# Patient Record
Sex: Female | Born: 1945
Health system: Southern US, Community
[De-identification: ages and names within clinical notes are randomized; demographics above are authoritative.]

## PROBLEM LIST (undated history)

## (undated) DIAGNOSIS — C8590 Non-Hodgkin lymphoma, unspecified, unspecified site: Secondary | ICD-10-CM

## (undated) DIAGNOSIS — E785 Hyperlipidemia, unspecified: Secondary | ICD-10-CM

## (undated) DIAGNOSIS — F39 Unspecified mood [affective] disorder: Secondary | ICD-10-CM

## (undated) DIAGNOSIS — Z780 Asymptomatic menopausal state: Secondary | ICD-10-CM

## (undated) DIAGNOSIS — M159 Polyosteoarthritis, unspecified: Secondary | ICD-10-CM

## (undated) DIAGNOSIS — D259 Leiomyoma of uterus, unspecified: Secondary | ICD-10-CM

## (undated) DIAGNOSIS — G479 Sleep disorder, unspecified: Secondary | ICD-10-CM

## (undated) DIAGNOSIS — I10 Essential (primary) hypertension: Secondary | ICD-10-CM

## (undated) HISTORY — DX: Asymptomatic menopausal state: Z78.0

## (undated) HISTORY — DX: Hyperlipidemia, unspecified: E78.5

## (undated) HISTORY — DX: Unspecified mood (affective) disorder: F39

## (undated) HISTORY — DX: Polyosteoarthritis, unspecified: M15.9

## (undated) HISTORY — PX: CATARACT EXTRACTION W/ INTRAOCULAR LENS IMPLANT: SHX1309

## (undated) HISTORY — DX: Leiomyoma of uterus, unspecified: D25.9

## (undated) HISTORY — DX: Essential (primary) hypertension: I10

## (undated) HISTORY — DX: Sleep disorder, unspecified: G47.9

## (undated) HISTORY — DX: Non-Hodgkin lymphoma, unspecified, unspecified site: C85.90

## (undated) HISTORY — PX: OTHER SURGICAL HISTORY: SHX169

## (undated) NOTE — *Deleted (*Deleted)
HEMATOLOGY/ONCOLOGY CONSULTATION NOTE  Date of Service: 01/31/2020  Patient Care Team: Marisue Ivan, MD as PCP - General (Family Medicine)  CHIEF COMPLAINTS/PURPOSE OF CONSULTATION:  Non-Hodgkin's Lymphoma  ONCOLOGIC Hx:  B-cell lymphoproliferative disorder, NOS, diagnosed originally by fine needle aspiration of right groin lymph node on 10/25/12  a. S/p excisional biopsy of right inguinal lymph node on 12/08/12 with finding of B cell lymphoproliferative disorder with indistinct phenotype.  b. PET and CT scans performed on 12/21/12 consistent with clinical stage disease with adenopathy in both inguinal and femoral areas and left posterior triangle in the neck and probably the left axilla.  c. Completion of five cycles of CVP-R from 05/05/18 through 08/18/18.   12/08/2012 Surgical pathology of right groin lymph node found: Abnormal Lymphoma FISH result, loss of IGH/14q DNA sequence   HISTORY OF PRESENTING ILLNESS:   Sherry Mosley is a wonderful 52 y.o. female who has been referred to [place] by Dr Versie Starks for evaluation and management of Non-Hodgkin's Lymphoma. The pt reports that she is doing well overall.  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 She has recently moved to [place] on 11/07/2018. She has not had the chance to begin care with a PCP.   The pt reports that she had Pancreatitis in 09/2012 which send her to the ER. Pt also notes that she had a rash that began in 08/2012. Her son, who is also a physician, thought that she had passing viral Pancreatitis due to her symptoms. She then had inguinal lymphadenopathy that was incidentally noted but was not causing problems. She was diagnosed with Non-Hodgkin's Lymphoma in 09/2012 after an excisional biopsy of right inguinal lymph node and a PET/CT Scan. They were following her until 06/2018 when they did a repeat CT scan and found enlarging lymph nodes. Pt denies having any symptoms at that time. They then  began R-CVP in March due to the pt's insistance but she began feeling fatigued and was in bed for >50% of the time. She chose to stop treatment after fifth cycle due to her moving and knowing that she would not be able to do so with the severe fatigue. Her last treatment was in June. She does not currently feel any enlarged lymph nodes but she notes that she doesn't examine herself often. Pt also had some peripheral neuropathy with R-CVP which has since resolved. She denies any transfusion needs or infection issues during treatments. Pt did have some insomnia with Prednisone at 100 mg so she was lowered to 60 mg which was much better. She did not have any cytopenias related to her treatment.  Pt is on Lisinopril 10mg  for her Essential HTN and Simvastatin 20mg  for HLD. Pt has continued to stay up to date with her age-appropriate cancer screenings. Her father was diagnosed with stage 4 Prostate Cancer in his 49's and he lived for 11 years after diagnosis. There is no other family history of cancer or blood/immunological disorders. Pt has not been on any other immunosuppressive drugs and has never had any rheumatological issues. Pt has never smoked and does not drink outside of social situations.   Most recent lab results (09/12/2018) of CBC is as follows: all values are WNL except for WBC at 4.7K, ANC at 2612, Hgb at 13.4, PLTs at 202K.  On review of systems, pt denies oral thrush, abdominal pain, change in bowel habits, fever, chills, drenching night sweats, skin rashes, fatigue, numbness/tingling in her fingers and any other symptoms.  On PMHx the pt reports HTN, HLD, Pancreatitis (09/2012). On Social Hx the pt reports she is a non-smoker, doesn't drink outside of social situations. On Family Hx the pt reports a father Dx with Prostate Cancer in his 13's.  INTERVAL HISTORY:  Sherry Mosley is a wonderful 53 y.o. female who is here for evaluation and management of Non-Hodgkin's Lymphoma. The patient's  last visit with [place] was on 08/31/2019. The pt reports that she is doing well overall.  The pt reports ***  Of note since the patient's last visit, pt has had *** completed on *** with results revealing ***.  Lab results today (01/31/20) of CBC w/diff and CMP is as follows: all values are WNL except for ***. 01/31/2020 LDH at ***  On review of systems, pt reports *** and denies *** and any other symptoms.   A&P: -Discussed pt labwork today, 01/31/20; *** -***  MEDICAL HISTORY:   Pancreatitis (10/20/12), unknown etiology  Postmenopausal Uterine fibroids Benign breast biopsy (1964) Right ankle arthrodesis (2012) Bilateral cataract extractions (2001, 2005) Hypercholesterolemia Essential hypertension  SURGICAL HISTORY: Inguinal LN biopsy TAH/BSO (1997)  SOCIAL HISTORY: Social History   Socioeconomic History  . Marital status: Married    Spouse name: Not on file  . Number of children: 2  . Years of education: Not on file  . Highest education level: Not on file  Occupational History  . 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 Smoking status: Never Smoker  . Smokeless tobacco: Never Used  Substance and Sexual Activity  . Alcohol use: Not on file  . Drug use: Not on file  . Sexual activity: Not on file  Other Topics Concern  . Not on file  Social History Narrative   1 daughter---pastor   Son --ER physician   3 stepsons   Current marriage since 2002      Has living will   Husband is health care POA. Son is alternate   Would accept resuscitation   No tube feeds if cognitively unaware   Social Determinants of Health   Financial Resource Strain:   . Difficulty of Paying Living Expenses: Not on file  Food Insecurity:   . Worried About Programme researcher, broadcasting/film/video in the Last Year: Not on file  . Ran Out of Food in the Last Year: Not on file  Transportation Needs:   . Lack of Transportation (Medical): Not on file  . Lack of  Transportation (Non-Medical): Not on file  Physical Activity:   . Days of Exercise per Week: Not on file  . Minutes of Exercise per Session: Not on file  Stress:   . Feeling of Stress : Not on file  Social Connections:   . Frequency of Communication with Friends and Family: Not on file  . Frequency of Social Gatherings with Friends and Family: Not on file  . Attends Religious Services: Not on file  . Active Member of Clubs or Organizations: Not on file  . Attends Banker Meetings: Not on file  . Marital Status: Not on file  Intimate Partner Violence:   . Fear of Current or Ex-Partner: Not on file  . Emotionally Abused: Not on file  . Physically Abused: Not on file  . Sexually Abused: Not on file    FAMILY HISTORY: Family History  Problem Relation Age of Onset  . Alzheimer's disease Mother   . Heart failure Father   . Prostate cancer Father   .  Diabetes Maternal Grandfather   . Breast cancer Neg Hx     ALLERGIES:  is allergic to cephalosporins and penicillins.  PCN - significant rash  Cephalosporins - significant rash   MEDICATIONS:  Current Outpatient Medications  Medication Sig Dispense Refill  . Ascorbic Acid (VITAMIN C) 100 MG tablet Take 1 tablet by mouth daily.    . [place]well[place]-Glucosamine-Vit D (OSTEO BI-FLEX ONE PER DAY PO) Take by mouth.    . Cholecalciferol (VITAMIN D3) 50 MCG (2000 UT) capsule Take 1 capsule by mouth daily.    . citalopram (CELEXA) 20 MG tablet Take 1 tablet by mouth daily.    Marland Kitchen lisinopril (ZESTRIL) 10 MG tablet Take 1 tablet (10 mg total) by mouth daily. 30 tablet 5  . Loratadine 10 MG CAPS Take 1 tablet by mouth daily.    . nabumetone (RELAFEN) 750 MG tablet Take 1 tablet (750 mg total) by mouth 2 (two) times daily as needed. 30 tablet 3  . simvastatin (ZOCOR) 20 MG tablet Take 1 tablet (20 mg total) by mouth daily. 30 tablet 5  . temazepam (RESTORIL) 15 MG capsule Take 1 capsule by mouth daily.    . vitamin E 400 UNIT capsule Take  1 capsule by mouth daily.     No current facility-administered medications for this visit.  Lisinopril 10mg  -  HTN  Simvastatin 20mg  - HLD  REVIEW OF SYSTEMS:   A 10+ POINT REVIEW OF SYSTEMS WAS OBTAINED including neurology, dermatology, psychiatry, cardiac, respiratory, lymph, extremities, GI, GU, Musculoskeletal, constitutional, breasts, reproductive, HEENT.  All pertinent positives are noted in the HPI.  All others are negative.   PHYSICAL EXAMINATION: ECOG PERFORMANCE STATUS: 0 - Asymptomatic  . There were no vitals filed for this visit. There were no vitals filed for this visit. .There is no height or weight on file to calculate BMI.   *** GENERAL:alert, in no acute distress and comfortable SKIN: no acute rashes, no significant lesions EYES: conjunctiva are pink and non-injected, sclera anicteric OROPHARYNX: MMM, no exudates, no oropharyngeal erythema or ulceration NECK: supple, no JVD LYMPH:  no palpable lymphadenopathy in the cervical, axillary or inguinal regions LUNGS: clear to auscultation b/l with normal respiratory effort HEART: regular rate & rhythm ABDOMEN:  normoactive bowel sounds , non tender, not distended. No palpable hepatosplenomegaly.  Extremity: no pedal edema PSYCH: alert & oriented x 3 with fluent speech NEURO: no focal motor/sensory deficits  LABORATORY DATA:  I have reviewed the data as listed  . CBC Latest Ref Rng & Units 08/06/2019 04/16/2019 12/12/2018  WBC 4.0 - 10.5 K/uL 4.6 4.3 4.3  Hemoglobin 12.0 - 15.0 g/dL 16.1 09.6 04.5  Hematocrit 36 - 46 % 43.1 43.0 43.8  Platelets 150 - 400 K/uL 166 181 191    CMP Latest Ref Rng & Units 08/06/2019 04/16/2019 12/12/2018  Glucose 70 - 99 mg/dL 86 409(W) 84  BUN 8 - 23 mg/dL 17 20 19   Creatinine 0.44 - 1.00 mg/dL 1.19 1.47 8.29  Sodium 135 - 145 mmol/L 139 141 142  Potassium 3.5 - 5.1 mmol/L 4.2 4.1 4.6  Chloride 98 - 111 mmol/L 103 105 105  CO2 22 - 32 mmol/L 27 28 30   Calcium 8.9 - 10.3 mg/dL 9.2 9.1  9.5  Total Protein 6.5 - 8.1 g/dL 7.5 7.4 7.6  Total Bilirubin 0.3 - 1.2 mg/dL 0.6 0.3 0.5  Alkaline Phos 38 - 126 U/L 71 110 60  AST 15 - 41 U/L 44(H) 27 18  ALT 0 -  44 U/L 54(H) 67(H) 21   SURGICAL PATHOLOGY  CASE: [id number]  PATIENT: Sherry Mosley  Surgical Pathology Report   Specimen Submitted:  A. Lymph node, right inguinal   Clinical History: History of low grade B cell lymphoma, now with right  inguinal lymphadenopathy, post US guided BX   DIAGNOSIS:  A. LYMPH NODE, RIGHT INGUINAL; ULTRASOUND-GUIDED BIOPSY:  - LOW-GRADE B-CELL LYMPHOMA WITH FEATURES MOST SUGGESTIVE OF MARGINAL  ZONE LYMPHOMA.   Comment:  Sections demonstrate small to medium sized lymphocytes with monocytoid  features. Scattered plasma cells are present. Residual normal lymphoid  architecture is not identified.  Material was submitted for flow cytometric analysis which revealed a  monoclonal kappa B cell population that was negative for CD5, CD23, and  CD10 (see scanned report in [facility]).  A panel of immunohistochemical stains was performed with the following  pattern of immunoreactivity:  CD20: Positive in neoplastic lymphocytes  CD3: Highlights background T cells in a distribution similar to CD5  CD5: Highlights background T cells in a distribution similar to CD3  CD23: Highlights focal residual dendritic meshworks  CD10: Negative in neoplastic lymphocytes  BCL-2: Positive in neoplastic lymphocytes  BCL 6: Negative in neoplastic lymphocytes  Cyclin D1: Negative in neoplastic lymphocytes  Sox 11: Negative in neoplastic lymphocytes  Kappa-ISH: Positive in neoplastic lymphocytes  Lambda-ISH: Negative in neoplastic lymphocytes  Ki-67: Approximately 30%  The histologic features in conjunction with the immunophenotypic  findings support the diagnosis of a low-grade mature B-cell lymphoma  most suggestive of marginal zone lymphoma.   Sox 11 IHC and kappa / lambda ISH slides were prepared by [facility] [facility], [place], [place].  The remaining IHC slides were prepared by [facility], [place], [place]. All controls stained appropriately.   This test was developed and its performance characteristics determined  by [facility]. It has not been cleared or approved by the [place] Food and Drug  Administration. The FDA does not require this test to go through  premarket FDA review. This test is used for clinical purposes. It should  not be regarded as investigational or for research. This laboratory is  certified under the Clinical Laboratory Improvement Amendments (CLIA) as  qualified to perform high complexity clinical laboratory testing.   GROSS DESCRIPTION:  A. Labeled: Right inguinal lymph node  Received: The specimen is received fresh on saline soaked gauze.  Tissue fragment(s): Multiple  Size: Range from 0.5 to 1.9 cm in length and 0.1 cm in diameter  Description: Received are multiple cores and fragments of tan-white soft  tissue. 1 touch prep with Diff-Quik stain is performed. Representative  sections are submitted for flow cytometry in RPMI. The remainder of the  specimen is submitted in cassettes 1-4 with 1 core per cassette.    Final Diagnosis performed by Elijah Birk, MD.  Electronically signed  08/31/2019 1:38:09PM  The electronic signature indicates that the named Attending Pathologist  has evaluated the specimen  Technical component performed at [facility], [address] [place],  [place] [address] Lab: [phone] Dir: Jolene Schimke, MD, MMM  Professional component performed at [facility], [facility], [address] [place], [place], [place] [address] Lab: [id number]  Dir: Georgiann Cocker. Oneita Kras, MD   RADIOGRAPHIC STUDIES: I have personally reviewed the radiological images as listed and agreed with the findings in the report. No results found.  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   PLAN: *** -Diagnosis of low grade MZL  -Pt prefers to wait and watch   FOLLOW UP: ***  The total time spent in the appt was *** minutes and more than 50% was on counseling and direct patient cares.  All of the patient's questions were answered with apparent satisfaction. The patient knows to call the clinic with any problems, questions or concerns.   Wyvonnia Lora MD MS AAHIVMS [facility] Hematology/Oncology Physician [facility]  (Office):       [phone] (Work cell):  [phone] (Fax):           [phone]  01/31/2020 9:12 AM  I, Carollee Herter, am acting as a scribe for Dr. Wyvonnia Lora.   {Add Production assistant, radio Statement}

---
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
## 1967-03-02 HISTORY — PX: BREAST BIOPSY: SHX20

## 1995-03-02 HISTORY — PX: ABDOMINAL HYSTERECTOMY: SHX81

## 2003-03-02 DIAGNOSIS — Z9841 Cataract extraction status, right eye: Secondary | ICD-10-CM

## 2003-03-02 HISTORY — DX: Cataract extraction status, right eye: Z98.41

## 2010-03-01 DIAGNOSIS — Z981 Arthrodesis status: Secondary | ICD-10-CM

## 2010-03-01 HISTORY — DX: Arthrodesis status: Z98.1

## 2010-03-01 HISTORY — PX: ANKLE ARTHRODESIS: SUR49

## 2012-03-01 HISTORY — PX: LYMPH NODE BIOPSY: SHX201

## 2012-10-20 DIAGNOSIS — K859 Acute pancreatitis without necrosis or infection, unspecified: Secondary | ICD-10-CM

## 2012-10-20 HISTORY — DX: Acute pancreatitis without necrosis or infection, unspecified: K85.90

## 2018-12-04 ENCOUNTER — Telehealth: Payer: Self-pay | Admitting: Hematology

## 2018-12-04 NOTE — Telephone Encounter (Signed)
Received a new patient referral from [place] for non-hodgkin lymphoma. Mrs. Sherry Mosley cld and has been scheduled to see Dr. Irene Limbo on 10/13 at 11am. She's been made aware to arrive 20 minutes early. I provided the address and location to our facility. Letter mailed.

## 2018-12-05 ENCOUNTER — Encounter: Payer: Self-pay | Admitting: Hematology

## 2018-12-11 NOTE — Progress Notes (Signed)
HEMATOLOGY/ONCOLOGY CONSULTATION NOTE  Date of Service: 12/12/2018  Patient Care Team: System, Pcp Not In as PCP - General  CHIEF COMPLAINTS/PURPOSE OF CONSULTATION:  Non-Hodgkin's Lymphoma  ONCOLOGIC Hx:  B-cell lymphoproliferative disorder, NOS, diagnosed originally by fine needle aspiration of right groin lymph node on 10/25/12  a. S/p excisional biopsy of right inguinal lymph node on 12/08/12 with finding of B cell lymphoproliferative disorder with indistinct phenotype.  b. PET and CT scans performed on 12/21/12 consistent with clinical stage disease with adenopathy in both inguinal and femoral areas and left posterior triangle in the neck and probably the left axilla.  c. Completion of five cycles of CVP-R from 05/05/18 through 08/18/18.   12/08/2012 Surgical pathology of right groin lymph node found: Abnormal Lymphoma FISH result, loss of IGH/14q DNA sequence   HISTORY OF PRESENTING ILLNESS:   Sherry Mosley is a wonderful 73 y.o. female who has been referred to [place] by Dr Seth Bake for evaluation and management of Non-Hodgkin's Lymphoma. The pt reports that she is doing well overall.  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. She has recently moved to [place] on 11/07/2018. She has not had the chance to begin care with a PCP.   The pt reports that she had Pancreatitis in 09/2012 which send her to the ER. Pt also notes that she had a rash that began in 08/2012. Her son, who is also a physician, thought that she had passing viral Pancreatitis due to her symptoms. She then had inguinal lymphadenopathy that was incidentally noted but was not causing problems. She was diagnosed with Non-Hodgkin's Lymphoma in 09/2012 after an excisional biopsy of right inguinal lymph node and a PET/CT Scan. They were following her until 06/2018 when they did a repeat CT scan and found enlarging lymph nodes. Pt denies having any symptoms at that time. They then began R-CVP in March  due to the pt's insistance but she began feeling fatigued and was in bed for >50% of the time. She chose to stop treatment after fifth cycle due to her moving and knowing that she would not be able to do so with the severe fatigue. Her last treatment was in June. She does not currently feel any enlarged lymph nodes but she notes that she doesn't examine herself often. Pt also had some peripheral neuropathy with R-CVP which has since resolved. She denies any transfusion needs or infection issues during treatments. Pt did have some insomnia with Prednisone at 100 mg so she was lowered to 60 mg which was much better. She did not have any cytopenias related to her treatment.  Pt is on Lisinopril 89m for her Essential HTN and Simvastatin 234mfor HLD. Pt has continued to stay up to date with her age-appropriate cancer screenings. Her father was diagnosed with stage 4 Prostate Cancer in his 702'snd he lived for 11 years after diagnosis. There is no other family history of cancer or blood/immunological disorders. Pt has not been on any other immunosuppressive drugs and has never had any rheumatological issues. Pt has never smoked and does not drink outside of social situations.   Most recent lab results (09/12/2018) of CBC is as follows: all values are WNL except for WBC at 4.7K, ANC at 2612, Hgb at 13.4, PLTs at 202K.  On review of systems, pt denies oral thrush, abdominal pain, change in bowel habits, fever, chills, drenching night sweats, skin rashes, fatigue, numbness/tingling in her fingers and any other symptoms.  On PMHx the pt reports HTN, HLD, Pancreatitis (09/2012). On Social Hx the pt reports she is a non-smoker, doesn't drink outside of social situations. On Family Hx the pt reports a father Dx with Prostate Cancer in his 37's.  MEDICAL HISTORY:   Pancreatitis (10/20/12), unknown etiology  Postmenopausal Uterine fibroids Benign breast biopsy (1964) Right ankle arthrodesis (2012) Bilateral  cataract extractions (2001, 2005) Hypercholesterolemia Essential hypertension  SURGICAL HISTORY: Inguinal LN biopsy TAH/BSO (1997)  SOCIAL HISTORY: Social History   Socioeconomic History  . Marital status: Married    Spouse name: Not on file  . Number of children: Not on file  . Years of education: Not on file  . Highest education level: Not on file  Occupational History  . Not on file  Social Needs  . Financial resource strain: Not on file  . Food insecurity    Worry: Not on file    Inability: Not on file  . Transportation needs    Medical: Not on file    Non-medical: Not on file  Tobacco Use  . Smoking status: Not on file  Substance and Sexual Activity  . Alcohol use: Not on file  . Drug use: Not on file  . Sexual activity: Not on file  Lifestyle  . Physical activity    Days per week: Not on file    Minutes per session: Not on file  . Stress: Not on file  Relationships  . Social Herbalist on phone: Not on file    Gets together: Not on file    Attends religious service: Not on file    Active member of club or organization: Not on file    Attends meetings of clubs or organizations: Not on file    Relationship status: Not on file  . Intimate partner violence    Fear of current or ex partner: Not on file    Emotionally abused: Not on file    Physically abused: Not on file    Forced sexual activity: Not on file  Other Topics Concern  . Not on file  Social History Narrative  . Not on file    FAMILY HISTORY: No family history on file.  ALLERGIES:  is allergic to cephalosporins and penicillins.  PCN - significant rash  Cephalosporins - significant rash   MEDICATIONS:  No current outpatient medications on file.   No current facility-administered medications for this visit.   Lisinopril 55m -  HTN  Simvastatin 268m- HLD  REVIEW OF SYSTEMS:    10 Point review of Systems was done is negative except as noted above.  PHYSICAL EXAMINATION:  ECOG PERFORMANCE STATUS: 0 - Asymptomatic  . Vitals:   12/12/18 1046  BP: (!) 143/89  Pulse: 87  Resp: 18  Temp: 98.5 F (36.9 C)  SpO2: 100%   Filed Weights   12/12/18 1046  Weight: 146 lb 8 oz (66.5 kg)   .Body mass index is 25.54 kg/m.  GENERAL:alert, in no acute distress and comfortable SKIN: no acute rashes, no significant lesions EYES: conjunctiva are pink and non-injected, sclera anicteric OROPHARYNX: MMM, no exudates, no oropharyngeal erythema or ulceration NECK: supple, no JVD LYMPH:  no palpable lymphadenopathy in the cervical, axillary or inguinal regions LUNGS: clear to auscultation b/l with normal respiratory effort HEART: regular rate & rhythm ABDOMEN:  normoactive bowel sounds , non tender, not distended. Extremity: no pedal edema PSYCH: alert & oriented x 3 with fluent speech NEURO: no focal motor/sensory deficits  LABORATORY DATA:  I have reviewed the data as listed  . CBC Latest Ref Rng & Units 12/12/2018  WBC 4.0 - 10.5 K/uL 4.3  Hemoglobin 12.0 - 15.0 g/dL 14.6  Hematocrit 36.0 - 46.0 % 43.8  Platelets 150 - 400 K/uL 191    CMP Latest Ref Rng & Units 12/12/2018  Glucose 70 - 99 mg/dL 84  BUN 8 - 23 mg/dL 19  Creatinine 0.44 - 1.00 mg/dL 0.91  Sodium 135 - 145 mmol/L 142  Potassium 3.5 - 5.1 mmol/L 4.6  Chloride 98 - 111 mmol/L 105  CO2 22 - 32 mmol/L 30  Calcium 8.9 - 10.3 mg/dL 9.5  Total Protein 6.5 - 8.1 g/dL 7.6  Total Bilirubin 0.3 - 1.2 mg/dL 0.5  Alkaline Phos 38 - 126 U/L 60  AST 15 - 41 U/L 18  ALT 0 - 44 U/L 21    RADIOGRAPHIC STUDIES: I have personally reviewed the radiological images as listed and agreed with the findings in the report. No results found.  ASSESSMENT & PLAN:  73 yo with   1) Atleast Stage IIIA Low grade B cell Non Hodgkins Lymphoma -indistinct phenotype ?Marginal zone lymphoma S/p R-CVP x  five cycles from 05/05/18 through 08/18/18.  PLAN: -Discussed patient's most recent labs from 09/12/2018, all  values are WNL except for WBC at 4.7K, ANC at 2612, Hgb at 13.4, PLTs at 202K. -Advised pt that we don't typically move to treat unless pt is experiencing constitutional symptoms, organ threatening bulky disease, or significantly altered blood counts -Will continue to monitor her labs and regular clinical examination -Recommend pt stay up to date with age-appropriate cancer screenings  -Will order PET/CT scan for post-treatment baseline in 1 week -Will get labs today for baseline -Will see back in 4 months with labs  -Advised pt that if she has any concerns or constitutional symptoms to contact us  FOLLOW UP: Labs today PET/CT in 1 week RTC with Dr Irene Limbo with labs in 4 months  All of the patients questions were answered with apparent satisfaction. The patient knows to call the clinic with any problems, questions or concerns.  I spent 85mns counseling the patient face to face. The total time spent in the appointment was 45 minutes and more than 50% was on counseling and direct patient cares.    GSullivan LoneMD MSoldierAAHIVMS SGrace Medical CenterCEndoscopy Center Of The Central CoastHematology/Oncology Physician [facility]West [facility] (Office):       [phone](Work cell):  [phone](Fax):           [phone] 12/12/2018 12:06 PM  I, JYevette Edwards am acting as a scribe for Dr. GSullivan Lone   .I have reviewed the above documentation for accuracy and completeness, and I agree with the above. .Brunetta GeneraMD     ADDENDUM  . CBC    Component Value Date/Time   WBC 4.3 12/12/2018 1203   RBC 4.77 12/12/2018 1203   HGB 14.6 12/12/2018 1203   HCT 43.8 12/12/2018 1203   PLT 191 12/12/2018 1203   MCV 91.8 12/12/2018 1203   MCH 30.6 12/12/2018 1203   MCHC 33.3 12/12/2018 1203   RDW 12.4 12/12/2018 1203   LYMPHSABS 1.3 12/12/2018 1203   MONOABS 0.5 12/12/2018 1203   EOSABS 0.1 12/12/2018 1203   BASOSABS 0.0 12/12/2018 1203    . CMP Latest Ref Rng & Units 12/12/2018  Glucose 70 - 99 mg/dL 84  BUN 8 -  23 mg/dL 19  Creatinine 0.44 - 1.00  mg/dL 0.91  Sodium 135 - 145 mmol/L 142  Potassium 3.5 - 5.1 mmol/L 4.6  Chloride 98 - 111 mmol/L 105  CO2 22 - 32 mmol/L 30  Calcium 8.9 - 10.3 mg/dL 9.5  Total Protein 6.5 - 8.1 g/dL 7.6  Total Bilirubin 0.3 - 1.2 mg/dL 0.5  Alkaline Phos 38 - 126 U/L 60  AST 15 - 41 U/L 18  ALT 0 - 44 U/L 21    . Lab Results  Component Value Date   LDH 175 12/12/2018

## 2018-12-12 ENCOUNTER — Inpatient Hospital Stay: Payer: Medicare Other | Attending: Hematology | Admitting: Hematology

## 2018-12-12 ENCOUNTER — Inpatient Hospital Stay: Payer: Medicare Other

## 2018-12-12 ENCOUNTER — Telehealth: Payer: Self-pay | Admitting: Hematology

## 2018-12-12 ENCOUNTER — Other Ambulatory Visit: Payer: Self-pay

## 2018-12-12 VITALS — BP 143/89 | HR 87 | Temp 98.5°F | Resp 18 | Ht 63.5 in | Wt 146.5 lb

## 2018-12-12 DIAGNOSIS — G4700 Insomnia, unspecified: Secondary | ICD-10-CM | POA: Diagnosis not present

## 2018-12-12 DIAGNOSIS — C8518 Unspecified B-cell lymphoma, lymph nodes of multiple sites: Secondary | ICD-10-CM

## 2018-12-12 DIAGNOSIS — Z9221 Personal history of antineoplastic chemotherapy: Secondary | ICD-10-CM | POA: Diagnosis not present

## 2018-12-12 DIAGNOSIS — E7800 Pure hypercholesterolemia, unspecified: Secondary | ICD-10-CM | POA: Insufficient documentation

## 2018-12-12 DIAGNOSIS — I10 Essential (primary) hypertension: Secondary | ICD-10-CM | POA: Diagnosis not present

## 2018-12-12 DIAGNOSIS — Z79899 Other long term (current) drug therapy: Secondary | ICD-10-CM | POA: Insufficient documentation

## 2018-12-12 DIAGNOSIS — Z90722 Acquired absence of ovaries, bilateral: Secondary | ICD-10-CM | POA: Insufficient documentation

## 2018-12-12 DIAGNOSIS — E785 Hyperlipidemia, unspecified: Secondary | ICD-10-CM | POA: Diagnosis not present

## 2018-12-12 LAB — CMP (CANCER CENTER ONLY)
ALT: 21 U/L (ref 0–44)
AST: 18 U/L (ref 15–41)
Albumin: 3.9 g/dL (ref 3.5–5.0)
Alkaline Phosphatase: 60 U/L (ref 38–126)
Anion gap: 7 (ref 5–15)
BUN: 19 mg/dL (ref 8–23)
CO2: 30 mmol/L (ref 22–32)
Calcium: 9.5 mg/dL (ref 8.9–10.3)
Chloride: 105 mmol/L (ref 98–111)
Creatinine: 0.91 mg/dL (ref 0.44–1.00)
GFR, Est AFR Am: >60 mL/min (ref >60)
GFR, Estimated: >60 mL/min (ref >60)
Glucose, Bld: 84 mg/dL (ref 70–99)
Potassium: 4.6 mmol/L (ref 3.5–5.1)
Sodium: 142 mmol/L (ref 135–145)
Total Bilirubin: 0.5 mg/dL (ref 0.3–1.2)
Total Protein: 7.6 g/dL (ref 6.5–8.1)

## 2018-12-12 LAB — CBC WITH DIFFERENTIAL/PLATELET
Abs Immature Granulocytes: 0.01 10*3/uL (ref 0.00–0.07)
Basophils Absolute: 0 10*3/uL (ref 0.0–0.1)
Basophils Relative: 1 %
Eosinophils Absolute: 0.1 10*3/uL (ref 0.0–0.5)
Eosinophils Relative: 3 %
HCT: 43.8 % (ref 36.0–46.0)
Hemoglobin: 14.6 g/dL (ref 12.0–15.0)
Immature Granulocytes: 0 %
Lymphocytes Relative: 31 %
Lymphs Abs: 1.3 10*3/uL (ref 0.7–4.0)
MCH: 30.6 pg (ref 26.0–34.0)
MCHC: 33.3 g/dL (ref 30.0–36.0)
MCV: 91.8 fL (ref 80.0–100.0)
Monocytes Absolute: 0.5 10*3/uL (ref 0.1–1.0)
Monocytes Relative: 11 %
Neutro Abs: 2.3 10*3/uL (ref 1.7–7.7)
Neutrophils Relative %: 54 %
Platelets: 191 10*3/uL (ref 150–400)
RBC: 4.77 MIL/uL (ref 3.87–5.11)
RDW: 12.4 % (ref 11.5–15.5)
WBC: 4.3 10*3/uL (ref 4.0–10.5)
nRBC: 0 % (ref 0.0–0.2)

## 2018-12-12 LAB — LACTATE DEHYDROGENASE: LDH: 175 U/L (ref 98–192)

## 2018-12-12 NOTE — Telephone Encounter (Signed)
Scheduled per 10/13 los, patient received after visit summary and calender.  °

## 2018-12-29 ENCOUNTER — Encounter (HOSPITAL_COMMUNITY)
Admission: RE | Admit: 2018-12-29 | Discharge: 2018-12-29 | Disposition: A | Discharge status: Home / Self Care | Payer: Medicare Other | Source: Ambulatory Visit | Attending: Hematology | Admitting: Hematology

## 2018-12-29 ENCOUNTER — Other Ambulatory Visit: Payer: Self-pay

## 2018-12-29 DIAGNOSIS — C8518 Unspecified B-cell lymphoma, lymph nodes of multiple sites: Secondary | ICD-10-CM | POA: Diagnosis present

## 2018-12-29 DIAGNOSIS — Z79899 Other long term (current) drug therapy: Secondary | ICD-10-CM | POA: Insufficient documentation

## 2018-12-29 LAB — GLUCOSE, CAPILLARY: Glucose-Capillary: 82 mg/dL (ref 70–99)

## 2018-12-29 MED ORDER — FLUDEOXYGLUCOSE F - 18 (FDG) INJECTION
7.3300 | Freq: Once | INTRAVENOUS | Status: AC | PRN
  Administered 2018-12-29: 7.33 via INTRAVENOUS

## 2019-01-16 ENCOUNTER — Telehealth: Payer: Self-pay

## 2019-01-16 NOTE — Telephone Encounter (Signed)
Yes Go ahead and set her up for an appt

## 2019-01-16 NOTE — Telephone Encounter (Signed)
Patient stated she is a new resident at [facility] and would like to know if you would be willing to see her as a new patient ?

## 2019-01-16 NOTE — Telephone Encounter (Signed)
I left a detailed message for patient to call back and schedule new patient appointment. For [facility], [place] will see patients with a problem in 1-2 weeks-patients no problems-1-2 months- 45 min appt. Marland Kitchen

## 2019-02-05 ENCOUNTER — Encounter: Payer: Self-pay | Admitting: Internal Medicine

## 2019-02-05 ENCOUNTER — Ambulatory Visit (INDEPENDENT_AMBULATORY_CARE_PROVIDER_SITE_OTHER): Payer: Medicare PPO | Admitting: Internal Medicine

## 2019-02-05 ENCOUNTER — Other Ambulatory Visit: Payer: Self-pay

## 2019-02-05 VITALS — BP 110/78 | HR 89 | Temp 97.6°F | Ht 62.75 in | Wt 145.0 lb

## 2019-02-05 DIAGNOSIS — M25511 Pain in right shoulder: Secondary | ICD-10-CM | POA: Diagnosis not present

## 2019-02-05 DIAGNOSIS — E785 Hyperlipidemia, unspecified: Secondary | ICD-10-CM | POA: Diagnosis not present

## 2019-02-05 DIAGNOSIS — I10 Essential (primary) hypertension: Secondary | ICD-10-CM | POA: Diagnosis not present

## 2019-02-05 DIAGNOSIS — M159 Polyosteoarthritis, unspecified: Secondary | ICD-10-CM | POA: Diagnosis not present

## 2019-02-05 DIAGNOSIS — C8590 Non-Hodgkin lymphoma, unspecified, unspecified site: Secondary | ICD-10-CM | POA: Insufficient documentation

## 2019-02-05 DIAGNOSIS — C8205 Follicular lymphoma grade I, lymph nodes of inguinal region and lower limb: Secondary | ICD-10-CM | POA: Diagnosis not present

## 2019-02-05 DIAGNOSIS — F39 Unspecified mood [affective] disorder: Secondary | ICD-10-CM | POA: Diagnosis not present

## 2019-02-05 DIAGNOSIS — G479 Sleep disorder, unspecified: Secondary | ICD-10-CM | POA: Insufficient documentation

## 2019-02-05 DIAGNOSIS — Z1211 Encounter for screening for malignant neoplasm of colon: Secondary | ICD-10-CM | POA: Diagnosis not present

## 2019-02-05 NOTE — Assessment & Plan Note (Signed)
Okay with primary prevention

## 2019-02-05 NOTE — Assessment & Plan Note (Signed)
BP Readings from Last 3 Encounters:  02/05/19 110/78  12/12/18 (!) 143/89   Good control Recent labs fine

## 2019-02-05 NOTE — Assessment & Plan Note (Signed)
Apparent soft tissue injury Discussed ROM and strengthening

## 2019-02-05 NOTE — Assessment & Plan Note (Signed)
Now under care here Will restart Rx eventually
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
## 2019-02-05 NOTE — Assessment & Plan Note (Addendum)
Discussed weaning off the temazepam Can try melatonin instead

## 2019-02-05 NOTE — Assessment & Plan Note (Signed)
Past reactive depression Discussed trying to wean off the citalopram

## 2019-02-05 NOTE — Assessment & Plan Note (Signed)
Discussed trying prn with the NSAID

## 2019-02-09 ENCOUNTER — Other Ambulatory Visit (INDEPENDENT_AMBULATORY_CARE_PROVIDER_SITE_OTHER): Payer: Medicare PPO

## 2019-02-09 DIAGNOSIS — Z1211 Encounter for screening for malignant neoplasm of colon: Secondary | ICD-10-CM | POA: Diagnosis not present

## 2019-02-09 LAB — FECAL OCCULT BLOOD, IMMUNOCHEMICAL: Fecal Occult Bld: NEGATIVE

## 2019-03-09 ENCOUNTER — Other Ambulatory Visit: Payer: Self-pay

## 2019-03-09 MED ORDER — LISINOPRIL 10 MG PO TABS: 10.0000 mg | ORAL_TABLET | Freq: Every day | ORAL | 5 refills | Status: DC

## 2019-03-09 MED ORDER — SIMVASTATIN 20 MG PO TABS: 20.0000 mg | ORAL_TABLET | Freq: Every day | ORAL | 5 refills | Status: DC

## 2019-03-09 MED ORDER — NABUMETONE 750 MG PO TABS: 750.0000 mg | ORAL_TABLET | Freq: Every day | ORAL | 0 refills | Status: DC

## 2019-03-09 NOTE — Telephone Encounter (Signed)
Patient left message on triage line stating Sherry Mosley has been trying to get refill on her medications and needs her b/p medication refilled. I left message for the patient to call us back so we can verify which medications she needs filled. Dr. Silvio Pate seen patient one time as a new patient. I do not see any requests for refills in the system from [facility] unless it came through as paper fax.

## 2019-03-09 NOTE — Telephone Encounter (Signed)
Patient called back and left message stating she needs to have RX for Simvastatin, Lisinopril and Nabumetone refilled to [facility] [address] since [place] will not be able to send out her medications on time before she runs out.  I need nabumetone refilled by the provider. And per message from patient she is taking this medication 1 tablet daily not twice daily.  I also left message for patient asking her if she still needs refills to go to [place] also?

## 2019-03-12 ENCOUNTER — Other Ambulatory Visit: Payer: Self-pay | Admitting: Lab

## 2019-03-12 MED ORDER — SIMVASTATIN 20 MG PO TABS: 20.0000 mg | ORAL_TABLET | Freq: Every day | ORAL | 5 refills | Status: DC

## 2019-03-12 MED ORDER — LISINOPRIL 10 MG PO TABS: 10.0000 mg | ORAL_TABLET | Freq: Every day | ORAL | 5 refills | Status: DC

## 2019-03-12 MED ORDER — NABUMETONE 750 MG PO TABS: 750.0000 mg | ORAL_TABLET | Freq: Every day | ORAL | 0 refills | Status: DC

## 2019-03-12 NOTE — Telephone Encounter (Signed)
Sent message to Dr Silvio Pate

## 2019-03-12 NOTE — Telephone Encounter (Signed)
Pt called office on 03/09/2019 Patient called back and left message stating she needs to have RX for Simvastatin, Lisinopril and Nabumetone refilled to [facility] [address] since [place] will not be able to send out her medications on time before she runs out.  I need nabumetone refilled by the provider. And per message from patient she is taking this medication 1 tablet daily not twice daily.  I also left message for patient asking her if she still needs refills to go to [place] also?

## 2019-04-16 ENCOUNTER — Telehealth: Payer: Self-pay | Admitting: Hematology

## 2019-04-16 ENCOUNTER — Inpatient Hospital Stay (HOSPITAL_BASED_OUTPATIENT_CLINIC_OR_DEPARTMENT_OTHER): Payer: Medicare PPO | Admitting: Hematology

## 2019-04-16 ENCOUNTER — Other Ambulatory Visit: Payer: Self-pay

## 2019-04-16 ENCOUNTER — Inpatient Hospital Stay: Payer: Medicare PPO | Attending: Hematology

## 2019-04-16 VITALS — BP 131/82 | HR 85 | Temp 97.8°F | Resp 18 | Ht 62.0 in | Wt 150.3 lb

## 2019-04-16 DIAGNOSIS — Z8042 Family history of malignant neoplasm of prostate: Secondary | ICD-10-CM | POA: Diagnosis not present

## 2019-04-16 DIAGNOSIS — C8518 Unspecified B-cell lymphoma, lymph nodes of multiple sites: Secondary | ICD-10-CM

## 2019-04-16 DIAGNOSIS — G4700 Insomnia, unspecified: Secondary | ICD-10-CM | POA: Diagnosis not present

## 2019-04-16 DIAGNOSIS — E785 Hyperlipidemia, unspecified: Secondary | ICD-10-CM | POA: Diagnosis not present

## 2019-04-16 DIAGNOSIS — Z791 Long term (current) use of non-steroidal anti-inflammatories (NSAID): Secondary | ICD-10-CM | POA: Insufficient documentation

## 2019-04-16 DIAGNOSIS — I10 Essential (primary) hypertension: Secondary | ICD-10-CM | POA: Diagnosis not present

## 2019-04-16 DIAGNOSIS — Z90710 Acquired absence of both cervix and uterus: Secondary | ICD-10-CM | POA: Insufficient documentation

## 2019-04-16 DIAGNOSIS — Z90722 Acquired absence of ovaries, bilateral: Secondary | ICD-10-CM | POA: Diagnosis not present

## 2019-04-16 DIAGNOSIS — E7800 Pure hypercholesterolemia, unspecified: Secondary | ICD-10-CM | POA: Diagnosis not present

## 2019-04-16 DIAGNOSIS — C8590 Non-Hodgkin lymphoma, unspecified, unspecified site: Secondary | ICD-10-CM | POA: Diagnosis not present

## 2019-04-16 DIAGNOSIS — Z8249 Family history of ischemic heart disease and other diseases of the circulatory system: Secondary | ICD-10-CM | POA: Insufficient documentation

## 2019-04-16 DIAGNOSIS — Z79899 Other long term (current) drug therapy: Secondary | ICD-10-CM | POA: Diagnosis not present

## 2019-04-16 DIAGNOSIS — Z9221 Personal history of antineoplastic chemotherapy: Secondary | ICD-10-CM | POA: Insufficient documentation

## 2019-04-16 DIAGNOSIS — Z833 Family history of diabetes mellitus: Secondary | ICD-10-CM | POA: Insufficient documentation

## 2019-04-16 DIAGNOSIS — Z9079 Acquired absence of other genital organ(s): Secondary | ICD-10-CM | POA: Insufficient documentation

## 2019-04-16 LAB — CBC WITH DIFFERENTIAL/PLATELET
Abs Immature Granulocytes: 0.01 10*3/uL (ref 0.00–0.07)
Basophils Absolute: 0 10*3/uL (ref 0.0–0.1)
Basophils Relative: 0 %
Eosinophils Absolute: 0.3 10*3/uL (ref 0.0–0.5)
Eosinophils Relative: 6 %
HCT: 43 % (ref 36.0–46.0)
Hemoglobin: 14.4 g/dL (ref 12.0–15.0)
Immature Granulocytes: 0 %
Lymphocytes Relative: 29 %
Lymphs Abs: 1.2 10*3/uL (ref 0.7–4.0)
MCH: 31 pg (ref 26.0–34.0)
MCHC: 33.5 g/dL (ref 30.0–36.0)
MCV: 92.5 fL (ref 80.0–100.0)
Monocytes Absolute: 0.4 10*3/uL (ref 0.1–1.0)
Monocytes Relative: 9 %
Neutro Abs: 2.4 10*3/uL (ref 1.7–7.7)
Neutrophils Relative %: 56 %
Platelets: 181 10*3/uL (ref 150–400)
RBC: 4.65 MIL/uL (ref 3.87–5.11)
RDW: 12.2 % (ref 11.5–15.5)
WBC: 4.3 10*3/uL (ref 4.0–10.5)
nRBC: 0 % (ref 0.0–0.2)

## 2019-04-16 LAB — CMP (CANCER CENTER ONLY)
ALT: 67 U/L — ABNORMAL HIGH (ref 0–44)
AST: 27 U/L (ref 15–41)
Albumin: 3.7 g/dL (ref 3.5–5.0)
Alkaline Phosphatase: 110 U/L (ref 38–126)
Anion gap: 8 (ref 5–15)
BUN: 20 mg/dL (ref 8–23)
CO2: 28 mmol/L (ref 22–32)
Calcium: 9.1 mg/dL (ref 8.9–10.3)
Chloride: 105 mmol/L (ref 98–111)
Creatinine: 0.84 mg/dL (ref 0.44–1.00)
GFR, Est AFR Am: >60 mL/min (ref >60)
GFR, Estimated: >60 mL/min (ref >60)
Glucose, Bld: 111 mg/dL — ABNORMAL HIGH (ref 70–99)
Potassium: 4.1 mmol/L (ref 3.5–5.1)
Sodium: 141 mmol/L (ref 135–145)
Total Bilirubin: 0.3 mg/dL (ref 0.3–1.2)
Total Protein: 7.4 g/dL (ref 6.5–8.1)

## 2019-04-16 LAB — LACTATE DEHYDROGENASE: LDH: 182 U/L (ref 98–192)

## 2019-04-16 NOTE — Telephone Encounter (Signed)
Scheduled appt per 2/15 los - gave patient AVS and calender per los.

## 2019-04-16 NOTE — Progress Notes (Signed)
HEMATOLOGY/ONCOLOGY CONSULTATION NOTE  Date of Service: 04/16/2019  Patient Care Team: Venia Carbon, MD as PCP - General (Internal Medicine)  CHIEF COMPLAINTS/PURPOSE OF CONSULTATION:  Non-Hodgkin's Lymphoma  ONCOLOGIC Hx:  B-cell lymphoproliferative disorder, NOS, diagnosed originally by fine needle aspiration of right groin lymph node on 10/25/12  a. S/p excisional biopsy of right inguinal lymph node on 12/08/12 with finding of B cell lymphoproliferative disorder with indistinct phenotype.  b. PET and CT scans performed on 12/21/12 consistent with clinical stage disease with adenopathy in both inguinal and femoral areas and left posterior triangle in the neck and probably the left axilla.  c. Completion of five cycles of CVP-R from 05/05/18 through 08/18/18.   12/08/2012 Surgical pathology of right groin lymph node found: Abnormal Lymphoma FISH result, loss of IGH/14q DNA sequence   HISTORY OF PRESENTING ILLNESS:   Sherry Mosley is a wonderful 74 y.o. female who has been referred to [place] by Dr Seth Bake for evaluation and management of Non-Hodgkin's Lymphoma. The pt reports that she is doing well overall.  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 She has recently moved to [place] on 11/07/2018. She has not had the chance to begin care with a PCP.   The pt reports that she had Pancreatitis in 09/2012 which send her to the ER. Pt also notes that she had a rash that began in 08/2012. Her son, who is also a physician, thought that she had passing viral Pancreatitis due to her symptoms. She then had inguinal lymphadenopathy that was incidentally noted but was not causing problems. She was diagnosed with Non-Hodgkin's Lymphoma in 09/2012 after an excisional biopsy of right inguinal lymph node and a PET/CT Scan. They were following her until 06/2018 when they did a repeat CT scan and found enlarging lymph nodes. Pt denies having any symptoms at that time. They  then began R-CVP in March due to the pt's insistance but she began feeling fatigued and was in bed for >50% of the time. She chose to stop treatment after fifth cycle due to her moving and knowing that she would not be able to do so with the severe fatigue. Her last treatment was in June. She does not currently feel any enlarged lymph nodes but she notes that she doesn't examine herself often. Pt also had some peripheral neuropathy with R-CVP which has since resolved. She denies any transfusion needs or infection issues during treatments. Pt did have some insomnia with Prednisone at 100 mg so she was lowered to 60 mg which was much better. She did not have any cytopenias related to her treatment.  Pt is on Lisinopril 10m for her Essential HTN and Simvastatin 273mfor HLD. Pt has continued to stay up to date with her age-appropriate cancer screenings. Her father was diagnosed with stage 4 Prostate Cancer in his 7079'snd he lived for 11 years after diagnosis. There is no other family history of cancer or blood/immunological disorders. Pt has not been on any other immunosuppressive drugs and has never had any rheumatological issues. Pt has never smoked and does not drink outside of social situations.   Most recent lab results (09/12/2018) of CBC is as follows: all values are WNL except for WBC at 4.7K, ANC at 2612, Hgb at 13.4, PLTs at 202K.  On review of systems, pt denies oral thrush, abdominal pain, change in bowel habits, fever, chills, drenching night sweats, skin rashes, fatigue, numbness/tingling in her fingers and any other symptoms.  On PMHx the pt reports HTN, HLD, Pancreatitis (09/2012). On Social Hx the pt reports she is a non-smoker, doesn't drink outside of social situations. On Family Hx the pt reports a father Dx with Prostate Cancer in his 42's.  INTERVAL HISTORY:   Sherry Mosley is a wonderful 74 y.o. female who is here for evaluation and management of Non-Hodgkin's Lymphoma. We are  joined today by her husband, Mr. Bufano. The patient's last visit with [place] was on 12/12/2018. The pt reports that she is doing well overall.  The pt reports that she has been feeling and eating well. She denies any constitutional symptoms or recent medication changes. Pt has not noticed any changes in her cervical or inguinal lymph nodes since our last visit. She denies any groin symptoms or right leg swelling. She has had both doses of the COVID19 vaccine.   Of note since the patient's last visit, pt has had PET/CT ([id number]) completed on 12/29/2018 with results revealing "1. Low left cervical and right pelvic hypermetabolic adenopathy, consistent with active lymphoma. ([place]) 4. 2.  Aortic Atherosclerosis (ICD10-I70.0). 3. Sinus disease. 4. Possible constipation."  Lab results today (04/16/19) of CBC w/diff and CMP is as follows: all values are WNL except for Glucose at 111, ALT at 67. 04/16/2019 LDH at 182  On review of systems, pt reports healthy appetite and denies fevers, chills, night sweats, unexpected weight loss, new lumps/bumps, groin symptoms, right leg swelling and any other symptoms.   MEDICAL HISTORY:   Pancreatitis (10/20/12), unknown etiology  Postmenopausal Uterine fibroids Benign breast biopsy (1964) Right ankle arthrodesis (2012) Bilateral cataract extractions (2001, 2005) Hypercholesterolemia Essential hypertension  SURGICAL HISTORY: Inguinal LN biopsy TAH/BSO (1997)  SOCIAL HISTORY: Social History   Socioeconomic History  . Marital status: Married    Spouse name: Not on file  . Number of children: 2  . Years of education: Not on file  . Highest education level: Not on file  Occupational History  . 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 Smoking status: Never Smoker  . Smokeless tobacco: Never Used  Substance and Sexual Activity  . Alcohol use: Not on file  . Drug use: Not on file  . Sexual activity: Not on  file  Other Topics Concern  . Not on file  Social History Narrative   1 daughter---pastor   Son --ER physician   3 stepsons   Current marriage since 2002      Has living will   Husband is health care POA. Son is alternate   Would accept resuscitation   No tube feeds if cognitively unaware   Social Determinants of Health   Financial Resource Strain:   . Difficulty of Paying Living Expenses: Not on file  Food Insecurity:   . Worried About Charity fundraiser in the Last Year: Not on file  . Ran Out of Food in the Last Year: Not on file  Transportation Needs:   . Lack of Transportation (Medical): Not on file  . Lack of Transportation (Non-Medical): Not on file  Physical Activity:   . Days of Exercise per Week: Not on file  . Minutes of Exercise per Session: Not on file  Stress:   . Feeling of Stress : Not on file  Social Connections:   . Frequency of Communication with Friends and Family: Not on file  . Frequency of Social Gatherings with Friends and Family: Not on file  . Attends Religious Services: Not  on file  . Active Member of Clubs or Organizations: Not on file  . Attends Archivist Meetings: Not on file  . Marital Status: Not on file  Intimate Partner Violence:   . Fear of Current or Ex-Partner: Not on file  . Emotionally Abused: Not on file  . Physically Abused: Not on file  . Sexually Abused: Not on file    FAMILY HISTORY: Family History  Problem Relation Age of Onset  . Alzheimer's disease Mother   . Heart failure Father   . Prostate cancer Father   . Diabetes Maternal Grandfather     ALLERGIES:  is allergic to cephalosporins and penicillins.  PCN - significant rash  Cephalosporins - significant rash   MEDICATIONS:  Current Outpatient Medications  Medication Sig Dispense Refill  . Ascorbic Acid (VITAMIN C) 100 MG tablet Take 1 tablet by mouth daily.    . [place]-Glucosamine-Vit D (OSTEO BI-FLEX ONE PER DAY PO) Take by mouth.    .  Cholecalciferol (VITAMIN D3) 50 MCG (2000 UT) capsule Take 1 capsule by mouth daily.    . citalopram (CELEXA) 20 MG tablet Take 1 tablet by mouth daily.    Marland Kitchen lisinopril (ZESTRIL) 10 MG tablet Take 1 tablet (10 mg total) by mouth daily. 30 tablet 5  . Loratadine 10 MG CAPS Take 1 tablet by mouth daily.    . nabumetone (RELAFEN) 750 MG tablet Take 1 tablet (750 mg total) by mouth daily. 30 tablet 0  . simvastatin (ZOCOR) 20 MG tablet Take 1 tablet (20 mg total) by mouth daily. 30 tablet 5  . temazepam (RESTORIL) 15 MG capsule Take 1 capsule by mouth daily.    . vitamin E 400 UNIT capsule Take 1 capsule by mouth daily.     No current facility-administered medications for this visit.  Lisinopril 46m -  HTN  Simvastatin 217m- HLD  REVIEW OF SYSTEMS:   A 10+ POINT REVIEW OF SYSTEMS WAS OBTAINED including neurology, dermatology, psychiatry, cardiac, respiratory, lymph, extremities, GI, GU, Musculoskeletal, constitutional, breasts, reproductive, HEENT.  All pertinent positives are noted in the HPI.  All others are negative.   PHYSICAL EXAMINATION: ECOG PERFORMANCE STATUS: 0 - Asymptomatic  . Vitals:   04/16/19 1253  BP: 131/82  Pulse: 85  Resp: 18  Temp: 97.8 F (36.6 C)  SpO2: 100%   Filed Weights   04/16/19 1253  Weight: 150 lb 4.8 oz (68.2 kg)   .Body mass index is 27.49 kg/m.  GENERAL:alert, in no acute distress and comfortable SKIN: no acute rashes, no significant lesions EYES: conjunctiva are pink and non-injected, sclera anicteric OROPHARYNX: MMM, no exudates, no oropharyngeal erythema or ulceration NECK: supple, no JVD LYMPH:  no palpable lymphadenopathy in the cervical or axillary regions. 3-4 cm right inguinal lymph node.  LUNGS: clear to auscultation b/l with normal respiratory effort HEART: regular rate & rhythm ABDOMEN:  normoactive bowel sounds , non tender, not distended. No palpable hepatosplenomegaly.  Extremity: no pedal edema PSYCH: alert & oriented x 3 with  fluent speech NEURO: no focal motor/sensory deficits  LABORATORY DATA:  I have reviewed the data as listed  . CBC Latest Ref Rng & Units 04/16/2019 12/12/2018  WBC 4.0 - 10.5 K/uL 4.3 4.3  Hemoglobin 12.0 - 15.0 g/dL 14.4 14.6  Hematocrit 36.0 - 46.0 % 43.0 43.8  Platelets 150 - 400 K/uL 181 191    CMP Latest Ref Rng & Units 04/16/2019 12/12/2018  Glucose 70 - 99 mg/dL 111(H) 84  BUN  8 - 23 mg/dL 20 19  Creatinine 0.44 - 1.00 mg/dL 0.84 0.91  Sodium 135 - 145 mmol/L 141 142  Potassium 3.5 - 5.1 mmol/L 4.1 4.6  Chloride 98 - 111 mmol/L 105 105  CO2 22 - 32 mmol/L 28 30  Calcium 8.9 - 10.3 mg/dL 9.1 9.5  Total Protein 6.5 - 8.1 g/dL 7.4 7.6  Total Bilirubin 0.3 - 1.2 mg/dL 0.3 0.5  Alkaline Phos 38 - 126 U/L 110 60  AST 15 - 41 U/L 27 18  ALT 0 - 44 U/L 67(H) 21    RADIOGRAPHIC STUDIES: I have personally reviewed the radiological images as listed and agreed with the findings in the report. No results found.  ASSESSMENT & PLAN:  74 yo with   1) Atleast Stage IIIA Low grade B cell Non Hodgkins Lymphoma -indistinct phenotype ?Marginal zone lymphoma S/p R-CVP x  five cycles from 05/05/18 through 08/18/18.  PLAN: -Discussed pt labwork today, 04/16/19; blood counts are nml, ALT has normalized, blood chemistries are nml -Discussed 04/16/2019 LDH is WNL at 182 -Discussed 12/29/2018 PET/CT ([id number]) "1. Low left cervical and right pelvic hypermetabolic adenopathy, consistent with active lymphoma. ([place]) 4." -Advised again that we don't typically move to treat unless pt is experiencing constitutional symptoms, organ threatening bulky disease, bothersome disease, or cytopenias -Advised pt that we could get a rpt LN Bx to confirm lymphoma in cervical and inguinal areas - all evidence suggests residual/ recurrent low grade B cell lymphoma. -Discussed watching with labs, clinic visits, and rpt scans in 3-4 months vs beginning local radiation now to residual disease to try to  achieve NED status -Will continue to monitor her labs and regular clinical examination - pt agrees  -Will get CT C/A/P in 16 weeks -Will see back in 4 months with labs   FOLLOW UP: CT chest/abd/pelvis with labs in 16 weeks MD visit with Dr Irene Limbo in 4 months  The total time spent in the appt was 30 minutes and more than 50% was on counseling and direct patient cares.  All of the patient's questions were answered with apparent satisfaction. The patient knows to call the clinic with any problems, questions or concerns.    Sullivan Lone MD [place] AAHIVMS [facility] Hematology/Oncology Physician [facility]  (Office):       [phone] (Work cell):  [phone] (Fax):           [phone]  04/16/2019 1:38 PM  I, Yevette Edwards, am acting as a scribe for Dr. Sullivan Lone.   .I have reviewed the above documentation for accuracy and completeness, and I agree with the above. Brunetta Genera MD

## 2019-05-17 ENCOUNTER — Other Ambulatory Visit: Payer: Self-pay | Admitting: Internal Medicine

## 2019-05-17 DIAGNOSIS — Z1231 Encounter for screening mammogram for malignant neoplasm of breast: Secondary | ICD-10-CM

## 2019-06-06 ENCOUNTER — Ambulatory Visit
Admission: RE | Admit: 2019-06-06 | Discharge: 2019-06-06 | Disposition: A | Discharge status: Home / Self Care | Payer: Medicare PPO | Source: Ambulatory Visit | Attending: Internal Medicine | Admitting: Internal Medicine

## 2019-06-06 DIAGNOSIS — Z1231 Encounter for screening mammogram for malignant neoplasm of breast: Secondary | ICD-10-CM | POA: Insufficient documentation

## 2019-06-18 ENCOUNTER — Other Ambulatory Visit: Payer: Self-pay | Admitting: Internal Medicine

## 2019-06-18 DIAGNOSIS — N6489 Other specified disorders of breast: Secondary | ICD-10-CM

## 2019-06-18 DIAGNOSIS — R928 Other abnormal and inconclusive findings on diagnostic imaging of breast: Secondary | ICD-10-CM

## 2019-06-20 ENCOUNTER — Telehealth: Payer: Self-pay

## 2019-06-20 NOTE — Telephone Encounter (Signed)
Left message for patient to call back in regards to her mammogram results

## 2019-06-30 ENCOUNTER — Other Ambulatory Visit: Payer: Self-pay | Admitting: Internal Medicine

## 2019-06-30 NOTE — Telephone Encounter (Signed)
Last refilled on 03/12/2019 #30 with 0 refill to [facility].  LOV 02/05/2019 New patient No future appointments

## 2019-07-05 ENCOUNTER — Ambulatory Visit
Admission: RE | Admit: 2019-07-05 | Discharge: 2019-07-05 | Disposition: A | Discharge status: Home / Self Care | Payer: Medicare PPO | Source: Ambulatory Visit | Attending: Internal Medicine | Admitting: Internal Medicine

## 2019-07-05 DIAGNOSIS — R928 Other abnormal and inconclusive findings on diagnostic imaging of breast: Secondary | ICD-10-CM

## 2019-07-05 DIAGNOSIS — N6489 Other specified disorders of breast: Secondary | ICD-10-CM | POA: Diagnosis not present

## 2019-07-26 DIAGNOSIS — I10 Essential (primary) hypertension: Secondary | ICD-10-CM | POA: Diagnosis not present

## 2019-07-26 DIAGNOSIS — F418 Other specified anxiety disorders: Secondary | ICD-10-CM | POA: Diagnosis not present

## 2019-07-26 DIAGNOSIS — F5101 Primary insomnia: Secondary | ICD-10-CM | POA: Insufficient documentation

## 2019-07-26 DIAGNOSIS — E7800 Pure hypercholesterolemia, unspecified: Secondary | ICD-10-CM | POA: Diagnosis not present

## 2019-07-26 DIAGNOSIS — E782 Mixed hyperlipidemia: Secondary | ICD-10-CM | POA: Insufficient documentation

## 2019-07-26 DIAGNOSIS — C8590 Non-Hodgkin lymphoma, unspecified, unspecified site: Secondary | ICD-10-CM | POA: Diagnosis not present

## 2019-07-26 DIAGNOSIS — Z8572 Personal history of non-Hodgkin lymphomas: Secondary | ICD-10-CM | POA: Insufficient documentation

## 2019-08-06 ENCOUNTER — Other Ambulatory Visit: Payer: Self-pay

## 2019-08-06 ENCOUNTER — Encounter (HOSPITAL_COMMUNITY): Payer: Self-pay

## 2019-08-06 ENCOUNTER — Ambulatory Visit (HOSPITAL_COMMUNITY)
Admission: RE | Admit: 2019-08-06 | Discharge: 2019-08-06 | Disposition: A | Discharge status: Home / Self Care | Payer: Medicare PPO | Source: Ambulatory Visit | Attending: Hematology | Admitting: Hematology

## 2019-08-06 ENCOUNTER — Inpatient Hospital Stay: Payer: Medicare PPO | Attending: Hematology

## 2019-08-06 DIAGNOSIS — C8590 Non-Hodgkin lymphoma, unspecified, unspecified site: Secondary | ICD-10-CM | POA: Insufficient documentation

## 2019-08-06 DIAGNOSIS — C8518 Unspecified B-cell lymphoma, lymph nodes of multiple sites: Secondary | ICD-10-CM

## 2019-08-06 DIAGNOSIS — I10 Essential (primary) hypertension: Secondary | ICD-10-CM | POA: Diagnosis not present

## 2019-08-06 DIAGNOSIS — Z9221 Personal history of antineoplastic chemotherapy: Secondary | ICD-10-CM | POA: Diagnosis not present

## 2019-08-06 DIAGNOSIS — E7800 Pure hypercholesterolemia, unspecified: Secondary | ICD-10-CM | POA: Diagnosis not present

## 2019-08-06 DIAGNOSIS — C8330 Diffuse large B-cell lymphoma, unspecified site: Secondary | ICD-10-CM | POA: Diagnosis not present

## 2019-08-06 LAB — CMP (CANCER CENTER ONLY)
ALT: 54 U/L — ABNORMAL HIGH (ref 0–44)
AST: 44 U/L — ABNORMAL HIGH (ref 15–41)
Albumin: 3.6 g/dL (ref 3.5–5.0)
Alkaline Phosphatase: 71 U/L (ref 38–126)
Anion gap: 9 (ref 5–15)
BUN: 17 mg/dL (ref 8–23)
CO2: 27 mmol/L (ref 22–32)
Calcium: 9.2 mg/dL (ref 8.9–10.3)
Chloride: 103 mmol/L (ref 98–111)
Creatinine: 0.93 mg/dL (ref 0.44–1.00)
GFR, Est AFR Am: >60 mL/min (ref >60)
GFR, Estimated: >60 mL/min (ref >60)
Glucose, Bld: 86 mg/dL (ref 70–99)
Potassium: 4.2 mmol/L (ref 3.5–5.1)
Sodium: 139 mmol/L (ref 135–145)
Total Bilirubin: 0.6 mg/dL (ref 0.3–1.2)
Total Protein: 7.5 g/dL (ref 6.5–8.1)

## 2019-08-06 LAB — CBC WITH DIFFERENTIAL/PLATELET
Abs Immature Granulocytes: 0.01 10*3/uL (ref 0.00–0.07)
Basophils Absolute: 0 10*3/uL (ref 0.0–0.1)
Basophils Relative: 0 %
Eosinophils Absolute: 0.1 10*3/uL (ref 0.0–0.5)
Eosinophils Relative: 2 %
HCT: 43.1 % (ref 36.0–46.0)
Hemoglobin: 14.3 g/dL (ref 12.0–15.0)
Immature Granulocytes: 0 %
Lymphocytes Relative: 31 %
Lymphs Abs: 1.4 10*3/uL (ref 0.7–4.0)
MCH: 31.5 pg (ref 26.0–34.0)
MCHC: 33.2 g/dL (ref 30.0–36.0)
MCV: 94.9 fL (ref 80.0–100.0)
Monocytes Absolute: 0.5 10*3/uL (ref 0.1–1.0)
Monocytes Relative: 11 %
Neutro Abs: 2.6 10*3/uL (ref 1.7–7.7)
Neutrophils Relative %: 56 %
Platelets: 166 10*3/uL (ref 150–400)
RBC: 4.54 MIL/uL (ref 3.87–5.11)
RDW: 12.2 % (ref 11.5–15.5)
WBC: 4.6 10*3/uL (ref 4.0–10.5)
nRBC: 0 % (ref 0.0–0.2)

## 2019-08-06 LAB — LACTATE DEHYDROGENASE: LDH: 216 U/L — ABNORMAL HIGH (ref 98–192)

## 2019-08-06 MED ORDER — IOHEXOL 300 MG/ML  SOLN
100.0000 mL | Freq: Once | INTRAMUSCULAR | Status: AC | PRN
  Administered 2019-08-06: 100 mL via INTRAVENOUS

## 2019-08-06 MED ORDER — SODIUM CHLORIDE (PF) 0.9 % IJ SOLN
INTRAMUSCULAR | Status: AC
  Filled 2019-08-06: qty 50

## 2019-08-08 NOTE — Progress Notes (Signed)
HEMATOLOGY/ONCOLOGY CONSULTATION NOTE  Date of Service: 08/09/2019  Patient Care Team: Dion Body, MD as PCP - General (Family Medicine)  CHIEF COMPLAINTS/PURPOSE OF CONSULTATION:  Non-Hodgkin's Lymphoma  ONCOLOGIC Hx:  B-cell lymphoproliferative disorder, NOS, diagnosed originally by fine needle aspiration of right groin lymph node on 10/25/12  a. S/p excisional biopsy of right inguinal lymph node on 12/08/12 with finding of B cell lymphoproliferative disorder with indistinct phenotype.  b. PET and CT scans performed on 12/21/12 consistent with clinical stage disease with adenopathy in both inguinal and femoral areas and left posterior triangle in the neck and probably the left axilla.  c. Completion of five cycles of CVP-R from 05/05/18 through 08/18/18.   12/08/2012 Surgical pathology of right groin lymph node found: Abnormal Lymphoma FISH result, loss of IGH/14q DNA sequence   HISTORY OF PRESENTING ILLNESS:   Sherry Mosley is a wonderful 74 y.o. female who has been referred to [place] by Dr Seth Bake for evaluation and management of Non-Hodgkin's Lymphoma. The pt reports that she is doing well overall.  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 She has recently moved to [place] on 11/07/2018. She has not had the chance to begin care with a PCP.   The pt reports that she had Pancreatitis in 09/2012 which send her to the ER. Pt also notes that she had a rash that began in 08/2012. Her son, who is also a physician, thought that she had passing viral Pancreatitis due to her symptoms. She then had inguinal lymphadenopathy that was incidentally noted but was not causing problems. She was diagnosed with Non-Hodgkin's Lymphoma in 09/2012 after an excisional biopsy of right inguinal lymph node and a PET/CT Scan. They were following her until 06/2018 when they did a repeat CT scan and found enlarging lymph nodes. Pt denies having any symptoms at that time. They then  began R-CVP in March due to the pt's insistance but she began feeling fatigued and was in bed for >50% of the time. She chose to stop treatment after fifth cycle due to her moving and knowing that she would not be able to do so with the severe fatigue. Her last treatment was in June. She does not currently feel any enlarged lymph nodes but she notes that she doesn't examine herself often. Pt also had some peripheral neuropathy with R-CVP which has since resolved. She denies any transfusion needs or infection issues during treatments. Pt did have some insomnia with Prednisone at 100 mg so she was lowered to 60 mg which was much better. She did not have any cytopenias related to her treatment.  Pt is on Lisinopril 57m for her Essential HTN and Simvastatin 25mfor HLD. Pt has continued to stay up to date with her age-appropriate cancer screenings. Her father was diagnosed with stage 4 Prostate Cancer in his 7031'snd he lived for 11 years after diagnosis. There is no other family history of cancer or blood/immunological disorders. Pt has not been on any other immunosuppressive drugs and has never had any rheumatological issues. Pt has never smoked and does not drink outside of social situations.   Most recent lab results (09/12/2018) of CBC is as follows: all values are WNL except for WBC at 4.7K, ANC at 2612, Hgb at 13.4, PLTs at 202K.  On review of systems, pt denies oral thrush, abdominal pain, change in bowel habits, fever, chills, drenching night sweats, skin rashes, fatigue, numbness/tingling in her fingers and any other symptoms.  On PMHx the pt reports HTN, HLD, Pancreatitis (09/2012). On Social Hx the pt reports she is a non-smoker, doesn't drink outside of social situations. On Family Hx the pt reports a father Dx with Prostate Cancer in his 52's.  INTERVAL HISTORY:  Sherry Mosley is a wonderful 74 y.o. female who is here for evaluation and management of Non-Hodgkin's Lymphoma. We are joined  today by her husband.  The patient's last visit with [place] was on 04/16/19. The pt reports that she is doing well overall.  The pt reports she is good. Pt has gotten both doses of the COVID19 vaccine. She is tired sometimes but attributes it to age. Pt does have back pain as well. The lymph node in the inguinal region has not bee bothering her but she can tell it is larger.   Of note since the patient's last visit, pt has had CT Abdomen Pelvis W Contrast ([id number]) completed on 08/06/19 with results revealing "1. Mild increase in abdominal retroperitoneal and right pelvic Lymphadenopathy. 2. No evidence of recurrent lymphoma within the chest." Pt had CT Chest W Contrast ([id number]) completed on 08/06/19 with results revealing "1. Mild increase in abdominal retroperitoneal and right pelvic lymphadenopathy. 2. No evidence of recurrent lymphoma within the chest."  Lab results today (08/06/19) of CBC w/diff and CMP is as follows: all values are WNL except for AST at 44, ALT at 54 08/06/19 of LDH at 216  On review of systems, pt reports healthy appetite and denies infection issues, fevers, chills, night sweats, unexpected weight loss, fatigue, abdominal pain, changes in breathing, skin rashes, pedal edema, changes in bowl habits, changes in urinary habits and any other symptoms.   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 Son is alternate   Would accept resuscitation   No tube feeds if cognitively unaware   Social Determinants of Health   Financial Resource Strain:    Difficulty of Paying Living Expenses:   Food Insecurity:    Worried About Charity fundraiser in the Last Year:    Arboriculturist in the Last Year:   Transportation Needs:    Film/video editor (Medical):    Lack of Transportation (Non-Medical):   Physical Activity:    Days of Exercise per Week:    Minutes of Exercise per Session:   Stress:    Feeling of Stress :   Social Connections:    Frequency of Communication with Friends and Family:  Frequency of Social Gatherings with Friends and Family:    Attends Religious Services:    Active Member of Clubs or Organizations:    Attends Music therapist:    Marital Status:   Intimate Partner Violence:    Fear of Current or Ex-Partner:    Emotionally Abused:    Physically Abused:    Sexually Abused:     FAMILY HISTORY: Family History  Problem Relation Age of Onset   Alzheimer's disease Mother    Heart failure Father    Prostate cancer Father    Diabetes Maternal Grandfather    Breast cancer Neg Hx     ALLERGIES:  is allergic to cephalosporins and penicillins.  PCN - significant rash  Cephalosporins - significant rash   MEDICATIONS:  Current Outpatient Medications  Medication Sig Dispense Refill   Ascorbic Acid (VITAMIN C) 100 MG tablet Take 1 tablet by mouth daily.     Boswellia-Glucosamine-Vit D (OSTEO BI-FLEX ONE PER DAY PO) Take by mouth.      Cholecalciferol (VITAMIN D3) 50 MCG (2000 UT) capsule Take 1 capsule by mouth daily.     citalopram (CELEXA) 20 MG tablet Take 1 tablet by mouth daily.     lisinopril (ZESTRIL) 10 MG tablet Take 1 tablet (10 mg total) by mouth daily. 30 tablet 5   Loratadine 10 MG CAPS Take 1 tablet by mouth daily.     nabumetone (RELAFEN) 750 MG tablet Take 1 tablet (750 mg total) by mouth 2 (two) times daily as needed. 30 tablet 3   simvastatin (ZOCOR) 20 MG tablet Take 1 tablet (20 mg total) by mouth daily. 30 tablet 5   temazepam (RESTORIL) 15 MG capsule Take 1 capsule by mouth daily.     vitamin E 400 UNIT capsule Take 1 capsule by mouth daily.     No current facility-administered medications for this visit.  Lisinopril 58m -  HTN  Simvastatin 257m- HLD  REVIEW OF SYSTEMS:   A 10+ POINT REVIEW OF SYSTEMS WAS OBTAINED including neurology, dermatology, psychiatry, cardiac, respiratory, lymph, extremities, GI, GU, Musculoskeletal, constitutional, breasts, reproductive, HEENT.  All pertinent positives are noted in the HPI.  All others are negative.   PHYSICAL EXAMINATION: ECOG PERFORMANCE STATUS: 0 - Asymptomatic  . Vitals:   08/09/19 1052  BP: 138/86  Pulse: (!) 104  Resp: 18  Temp: 97.7 F (36.5 C)  SpO2: 98%   Filed Weights   08/09/19 1052  Weight: 152 lb 1.6 oz (69 kg)   .Body mass index is 27.82 kg/m.  GENERAL:alert, in no acute distress and comfortable SKIN: no acute rashes, no significant lesions EYES: conjunctiva are pink and non-injected, sclera anicteric OROPHARYNX: MMM, no exudates, no oropharyngeal erythema or ulceration NECK: supple, no JVD LYMPH:  palpable lymphadenopathy in the cervical, and inguinal regions, no palpable lymphadenopathy in the axillary region LUNGS: clear to auscultation b/l with normal respiratory effort HEART: regular rate & rhythm ABDOMEN:  normoactive bowel sounds , non tender, not distended. Extremity: no pedal edema PSYCH: alert & oriented  x 3 with fluent speech NEURO: no focal motor/sensory deficits  LABORATORY DATA:  I have reviewed the data as listed  . CBC Latest Ref Rng & Units 08/06/2019 04/16/2019 12/12/2018  WBC 4.0 - 10.5 K/uL 4.6 4.3 4.3  Hemoglobin 12.0 - 15.0 g/dL 14.3 14.4 14.6  Hematocrit 36 - 46 % 43.1 43.0 43.8  Platelets 150 - 400 K/uL 166 181 191    CMP Latest Ref Rng & Units  08/06/2019 04/16/2019 12/12/2018  Glucose 70 - 99 mg/dL 86 111(H) 84  BUN 8 - 23 mg/dL _0 Creatinine 0.44 - 1.00 mg/dL 0.93 0.84 0.91  Sodium 135 - 145 mmol/L 139 141 142  Potassium 3.5 - 5.1 mmol/L 4.2 4.1 4.6  Chloride 98 - 111 mmol/L 103 105 105  CO2 22 - 32 mmol/L _1 Calcium 8.9 - 10.3 mg/dL 9.2 9.1 9.5  Total Protein 6.5 - 8.1 g/dL 7.5 7.4 7.6  Total Bilirubin 0.3 - 1.2 mg/dL 0.6 0.3 0.5  Alkaline Phos 38 - 126 U/L 71 110 60  AST 15 - 41 U/L 44(H) 27 18  ALT 0 - 44 U/L 54(H) 67(H) 21    RADIOGRAPHIC STUDIES: I have personally reviewed the radiological images as listed and agreed with the findings in the report. CT Chest W Contrast  Result Date: 08/06/2019 CLINICAL DATA:  Follow-up low-grade B-cell lymphoma. Evaluate treatment response. EXAM: CT CHEST, ABDOMEN, AND PELVIS WITH CONTRAST TECHNIQUE: Multidetector CT imaging of the chest, abdomen and pelvis was performed following the standard protocol during bolus administration of intravenous contrast. CONTRAST:  159m OMNIPAQUE IOHEXOL 300 MG/ML  SOLN COMPARISON:  PET-CT on 12/29/2018 FINDINGS: CT CHEST FINDINGS Cardiovascular: No acute findings. Mediastinum/Lymph Nodes: No masses or pathologically enlarged lymph nodes identified. Lungs/Pleura: No pulmonary infiltrate or mass identified. Stable scarring seen in posterior right lower lobe. No effusion present. Musculoskeletal:  No suspicious bone lesions identified. CT ABDOMEN AND PELVIS FINDINGS Hepatobiliary: No masses identified. Tiny cysts in right and left lobe remain stable. Gallbladder is unremarkable. No  evidence of biliary ductal dilatation. Pancreas:  No mass or inflammatory changes. Spleen:  Within normal limits in size and appearance. Adrenals/Urinary tract: Several small renal cysts again noted. No masses or hydronephrosis. Stomach/Bowel: Small hiatal hernia again noted. No evidence of bowel obstruction, inflammatory process, or abnormal fluid collections. Vascular/Lymphatic: Abdominal retroperitoneal in the aortocaval and paraaortic spaces shows increase since previous study. Index lymph node in the aortocaval space measures 10 mm on image 78/2, compared to 5 mm previously. Bulky lymphadenopathy is seen the left iliac lymph node chains, with largest node in the right external iliac chain measuring 3.8 cm short axis on image 97/2, compared to 2.6 cm previously. Bulky lymphadenopathy in the right inguinal region also increased since previous study, currently measuring 5.3 cm in short axis compared to 4.8 cm previously. No abdominal aortic aneurysm. Reproductive: Prior hysterectomy noted. Adnexal regions are unremarkable in appearance. Other:  None. Musculoskeletal:  No suspicious bone lesions identified. IMPRESSION: 1. Mild increase in abdominal retroperitoneal and right pelvic lymphadenopathy. 2. No evidence of recurrent lymphoma within the chest. Electronically Signed   By: JMarlaine HindM.D.   On: 08/06/2019 14:41   CT Abdomen Pelvis W Contrast  Result Date: 08/06/2019 CLINICAL DATA:  Follow-up low-grade B-cell lymphoma. Evaluate treatment response. EXAM: CT CHEST, ABDOMEN, AND PELVIS WITH CONTRAST TECHNIQUE: Multidetector CT imaging of the chest, abdomen and pelvis was performed following the standard protocol during bolus administration of intravenous contrast. CONTRAST:  1081mOMNIPAQUE IOHEXOL 300 MG/ML  SOLN COMPARISON:  PET-CT on 12/29/2018 FINDINGS: CT CHEST FINDINGS Cardiovascular: No acute findings. Mediastinum/Lymph Nodes: No masses or pathologically enlarged lymph nodes identified. Lungs/Pleura:  No pulmonary infiltrate or mass identified. Stable scarring seen in posterior right lower lobe. No effusion present. Musculoskeletal:  No suspicious bone lesions identified. CT ABDOMEN AND PELVIS FINDINGS Hepatobiliary: No masses identified. Tiny cysts in right and left lobe remain stable. Gallbladder is unremarkable. No  evidence of biliary ductal dilatation. Pancreas:  No mass or inflammatory changes. Spleen:  Within normal limits in size and appearance. Adrenals/Urinary tract: Several small renal cysts again noted. No masses or hydronephrosis. Stomach/Bowel: Small hiatal hernia again noted. No evidence of bowel obstruction, inflammatory process, or abnormal fluid collections. Vascular/Lymphatic: Abdominal retroperitoneal in the aortocaval and paraaortic spaces shows increase since previous study. Index lymph node in the aortocaval space measures 10 mm on image 78/2, compared to 5 mm previously. Bulky lymphadenopathy is seen the left iliac lymph node chains, with largest node in the right external iliac chain measuring 3.8 cm short axis on image 97/2, compared to 2.6 cm previously. Bulky lymphadenopathy in the right inguinal region also increased since previous study, currently measuring 5.3 cm in short axis compared to 4.8 cm previously. No abdominal aortic aneurysm. Reproductive: Prior hysterectomy noted. Adnexal regions are unremarkable in appearance. Other:  None. Musculoskeletal:  No suspicious bone lesions identified. IMPRESSION: 1. Mild increase in abdominal retroperitoneal and right pelvic lymphadenopathy. 2. No evidence of recurrent lymphoma within the chest. Electronically Signed   By: Marlaine Hind M.D.   On: 08/06/2019 14:41    ASSESSMENT & PLAN:  74 yo with   1) Atleast Stage IIIA Low grade B cell Non Hodgkins Lymphoma -indistinct phenotype ?Marginal zone lymphoma S/p R-CVP x  five cycles from 05/05/18 through 08/18/18.   PLAN: -Discussed pt labwork today, 08/06/19; of CBC w/diff and CMP is as  follows: all values are WNL except for AST at 44, ALT at 54 -Discussed 08/06/19 of LDH at 216 -Discussed 08/06/19 of CT Abdomen Pelvis W Contrast ([id number]) and CT Chest W Contrast ([id number]) -Advised pt that we could get a rpt LN Bx to confirm lymphoma in cervical and inguinal areas - all evidence suggests residual/ recurrent low grade B cell lymphoma. -Discussed watching with labs, clinic visits, and rpt scans in 4-6 months vs beginning local radiation now to residual disease to try to achieve NED status -Advised on mild progression  -Advised medications can elevate liver enzymes  -Advised again that we don't typically move to treat unless pt is experiencing constitutional symptoms, organ threatening bulky disease, bothersome disease, or cytopenias -Advised right inguinal lymph node is bulkier -can cause leg swelling and cause pressure effects  -Advised on continue watching  -Advised on getting core needle biopsy  -Advised on grade changes of lymphoma  -Advised could to immunotherapy maintenance -Recommended that the pt continue to sleep well, eat well, drink at least 48-64 oz of water each day, and walk 20-30 minutes each day.  -Recommends getting core needle biopsy  -Will continue to monitor her labs and regular clinical examination - pt agrees  -Will see back via phone visit after biopsy  FOLLOW UP: US guided biopsy of rt inguinal LN in 2 weeks Phone visit with Dr Irene Limbo in 3 weeks  The total time spent in the appt was 30 minutes and more than 50% was on counseling and direct patient cares.  All of the patient's questions were answered with apparent satisfaction. The patient knows to call the clinic with any problems, questions or concerns.  Sullivan Lone MD MS AAHIVMS [facility] Hematology/Oncology Physician [facility]  (Office):       [phone] (Work cell):  [phone] (Fax):           [phone]  08/09/2019 11:54 AM  I, Dawayne Cirri am acting as a scribe  for Dr. Sullivan Lone.   .I have reviewed the above documentation  for accuracy and completeness, and I agree with the above. Brunetta Genera MD

## 2019-08-09 ENCOUNTER — Inpatient Hospital Stay (HOSPITAL_BASED_OUTPATIENT_CLINIC_OR_DEPARTMENT_OTHER): Payer: Medicare PPO | Admitting: Hematology

## 2019-08-09 ENCOUNTER — Other Ambulatory Visit: Payer: Self-pay

## 2019-08-09 VITALS — BP 138/86 | HR 104 | Temp 97.7°F | Resp 18 | Ht 62.0 in | Wt 152.1 lb

## 2019-08-09 DIAGNOSIS — C8590 Non-Hodgkin lymphoma, unspecified, unspecified site: Secondary | ICD-10-CM | POA: Diagnosis not present

## 2019-08-09 DIAGNOSIS — Z9221 Personal history of antineoplastic chemotherapy: Secondary | ICD-10-CM | POA: Diagnosis not present

## 2019-08-09 DIAGNOSIS — C8518 Unspecified B-cell lymphoma, lymph nodes of multiple sites: Secondary | ICD-10-CM | POA: Diagnosis not present

## 2019-08-09 NOTE — Patient Instructions (Signed)
Thank you for choosing [facility] to provide your oncology and hematology care.   Should you have questions after your visit to the [facility] ([facility]), please contact this office at [phone] between 8:30 AM and 4:30 PM.  Voice mails left after 4:00 PM may not be returned until the following business day.  Calls received after 4:30 PM will be answered by an off-site Nurse Triage Line.    Prescription Refills:  Please have your pharmacy contact us directly for most prescription requests.  Contact the office directly for refills of narcotics (pain medications). Allow 48-72 hours for refills.  Appointments: Please contact the [facility] scheduling department [phone] for questions regarding [facility] appointment scheduling.  Contact the schedulers with any scheduling changes so that your appointment can be rescheduled in a timely manner.   Central Scheduling for [place] [phone] - Call to schedule procedures such as PET scans, CT scans, MRI, Ultrasound, etc.  To afford each patient quality time with our providers, please arrive 30 minutes before your scheduled appointment time.  If you arrive late for your appointment, you may be asked to reschedule.  We strive to give you quality time with our providers, and arriving late affects you and other patients whose appointments are after yours. If you are a no show for multiple scheduled visits, you may be dismissed from the clinic at the providers discretion.     Resources: [facility] Social Workers 336-832-0950 for additional information on assistance programs or assistance connecting with community support programs   [place] DSS  336-641-3447: Information regarding food stamps, Medicaid, and utility assistance [place] 336-333-6589   [place] [employer]'s shared-ride transportation service for eligible riders who have a disability that prevents them from riding the fixed route bus.   Medicare Rights Center  800-333-4114 Helps people with Medicare understand their rights and benefits, navigate the Medicare system, and secure the quality healthcare they deserve [employer] 800-227-2345 Assists patients locate various types of support and financial assistance Cancer Care: 1-800-813-HOPE (4673) Provides financial assistance, online support groups, medication/co-pay assistance.   Transportation Assistance for appointments at [facility]: Transportation Coordinator [phone]  Again, thank you for choosing  Cancer Center for your care.       

## 2019-08-10 NOTE — Progress Notes (Signed)
Sherry Mosley Female, 74 y.o., [birth year] MRN:  [id number] Phone:  [phone] Jerilynn Mages) PCP:  Dion Body, MD Coverage:  [facility] Medicare/[employer] Medicare Choice Ppo Next Appt With Oncology 08/31/2019 at 11:00 AM  RE: Biopsy Received: Yesterday Message Details  Arne Cleveland, MD  Lenore Cordia Ok   [place] core R ing LAN  F/u lymphoma   DDH   Previous Messages  ----- Message -----  From: Lenore Cordia  Sent: 08/09/2019  3:42 PM EDT  To: Ir Procedure Requests  Subject: Biopsy                      Procedure Requested: US Biopsy (Lymph Node)    Reason for Procedure: large RT inguinal lymph adenopathy for core needle biopsy for workup for lymphoma progression ? change in grade of lymphoma    Provider Requesting: Dr Irene Limbo  Provider Telephone:[phone]    Other Info: flow cytometry, molecular studies as needed

## 2019-08-16 ENCOUNTER — Other Ambulatory Visit: Payer: Self-pay | Admitting: Hematology

## 2019-08-16 ENCOUNTER — Ambulatory Visit: Admission: RE | Admit: 2019-08-16 | Payer: Medicare PPO | Source: Ambulatory Visit

## 2019-08-24 ENCOUNTER — Other Ambulatory Visit: Payer: Self-pay | Admitting: Student

## 2019-08-27 ENCOUNTER — Ambulatory Visit
Admission: RE | Admit: 2019-08-27 | Discharge: 2019-08-27 | Disposition: A | Discharge status: Home / Self Care | Payer: Medicare PPO | Source: Ambulatory Visit | Attending: Hematology | Admitting: Hematology

## 2019-08-27 ENCOUNTER — Other Ambulatory Visit: Payer: Self-pay

## 2019-08-27 DIAGNOSIS — C8518 Unspecified B-cell lymphoma, lymph nodes of multiple sites: Secondary | ICD-10-CM | POA: Diagnosis not present

## 2019-08-27 DIAGNOSIS — R599 Enlarged lymph nodes, unspecified: Secondary | ICD-10-CM | POA: Diagnosis not present

## 2019-08-27 DIAGNOSIS — Z8572 Personal history of non-Hodgkin lymphomas: Secondary | ICD-10-CM | POA: Diagnosis not present

## 2019-08-27 DIAGNOSIS — R590 Localized enlarged lymph nodes: Secondary | ICD-10-CM | POA: Diagnosis not present

## 2019-08-27 MED ORDER — SODIUM CHLORIDE 0.9 % IV SOLN: INTRAVENOUS | Status: DC

## 2019-08-27 NOTE — Procedures (Signed)
Pre Procedure Dx: Right inguinal lymphadenopathy Post Procedural Dx: Same  Technically successful US guided biopsy of dominant right inguinal lymph node.   EBL: None No immediate complications.   Ronny Bacon, MD Pager #: [phone]

## 2019-08-27 NOTE — Discharge Instructions (Signed)
Needle Biopsy, Care After This sheet gives you information about how to care for yourself after your procedure. Your health care provider may also give you more specific instructions. If you have problems or questions, contact your health care provider. What can I expect after the procedure? After the procedure, it is common to have soreness, bruising, or mild pain at the puncture site. This should go away in a few days. Follow these instructions at home: Needle insertion site care   Wash your hands with soap and water before you change your bandage (dressing). If you cannot use soap and water, use hand sanitizer.  Follow instructions from your health care provider about how to take care of your puncture site. This includes: ? When and how to change your dressing. ? When to remove your dressing.  Check your puncture site every day for signs of infection. Check for: ? Redness, swelling, or pain. ? Fluid or blood. ? Pus or a bad smell. ? Warmth. General instructions  Return to your normal activities as told by your health care provider. Ask your health care provider what activities are safe for you.  Do not take baths, swim, or use a hot tub until your health care provider approves. Ask your health care provider if you may take showers. You may only be allowed to take sponge baths.  Take over-the-counter and prescription medicines only as told by your health care provider.  Keep all follow-up visits as told by your health care provider. This is important. Contact a health care provider if:  You have a fever.  You have redness, swelling, or pain at the puncture site that lasts longer than a few days.  You have fluid, blood, or pus coming from your puncture site.  Your puncture site feels warm to the touch. Get help right away if:  You have severe bleeding from the puncture site. Summary  After the procedure, it is common to have soreness, bruising, or mild pain at the puncture  site. This should go away in a few days.  Check your puncture site every day for signs of infection, such as redness, swelling, or pain.  Get help right away if you have severe bleeding from your puncture site. This information is not intended to replace advice given to you by your health care provider. Make sure you discuss any questions you have with your health care provider. Document Revised: 04/29/2017 Document Reviewed: 02/28/2017 Elsevier Patient Education  2020 [employer]  

## 2019-08-29 NOTE — Progress Notes (Signed)
HEMATOLOGY/ONCOLOGY CONSULTATION NOTE  Date of Service: 08/31/2019  Patient Care Team: Dion Body, MD as PCP - General (Family Medicine)  CHIEF COMPLAINTS/PURPOSE OF CONSULTATION:  Non-Hodgkin's Lymphoma  ONCOLOGIC Hx:  B-cell lymphoproliferative disorder, NOS, diagnosed originally by fine needle aspiration of right groin lymph node on 10/25/12  a. S/p excisional biopsy of right inguinal lymph node on 12/08/12 with finding of B cell lymphoproliferative disorder with indistinct phenotype.  b. PET and CT scans performed on 12/21/12 consistent with clinical stage disease with adenopathy in both inguinal and femoral areas and left posterior triangle in the neck and probably the left axilla.  c. Completion of five cycles of CVP-R from 05/05/18 through 08/18/18.   12/08/2012 Surgical pathology of right groin lymph node found: Abnormal Lymphoma FISH result, loss of IGH/14q DNA sequence   HISTORY OF PRESENTING ILLNESS:   Sherry Mosley is a wonderful 74 y.o. female who has been referred to [place] by Dr Seth Bake for evaluation and management of Non-Hodgkin's Lymphoma. The pt reports that she is doing well overall.  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 She has recently moved to [place] on 11/07/2018. She has not had the chance to begin care with a PCP.   The pt reports that she had Pancreatitis in 09/2012 which send her to the ER. Pt also notes that she had a rash that began in 08/2012. Her son, who is also a physician, thought that she had passing viral Pancreatitis due to her symptoms. She then had inguinal lymphadenopathy that was incidentally noted but was not causing problems. She was diagnosed with Non-Hodgkin's Lymphoma in 09/2012 after an excisional biopsy of right inguinal lymph node and a PET/CT Scan. They were following her until 06/2018 when they did a repeat CT scan and found enlarging lymph nodes. Pt denies having any symptoms at that time. They then  began R-CVP in March due to the pt's insistance but she began feeling fatigued and was in bed for >50% of the time. She chose to stop treatment after fifth cycle due to her moving and knowing that she would not be able to do so with the severe fatigue. Her last treatment was in June. She does not currently feel any enlarged lymph nodes but she notes that she doesn't examine herself often. Pt also had some peripheral neuropathy with R-CVP which has since resolved. She denies any transfusion needs or infection issues during treatments. Pt did have some insomnia with Prednisone at 100 mg so she was lowered to 60 mg which was much better. She did not have any cytopenias related to her treatment.  Pt is on Lisinopril 76m for her Essential HTN and Simvastatin 238mfor HLD. Pt has continued to stay up to date with her age-appropriate cancer screenings. Her father was diagnosed with stage 4 Prostate Cancer in his 709'snd he lived for 11 years after diagnosis. There is no other family history of cancer or blood/immunological disorders. Pt has not been on any other immunosuppressive drugs and has never had any rheumatological issues. Pt has never smoked and does not drink outside of social situations.   Most recent lab results (09/12/2018) of CBC is as follows: all values are WNL except for WBC at 4.7K, ANC at 2612, Hgb at 13.4, PLTs at 202K.  On review of systems, pt denies oral thrush, abdominal pain, change in bowel habits, fever, chills, drenching night sweats, skin rashes, fatigue, numbness/tingling in her fingers and any other symptoms.  On PMHx the pt reports HTN, HLD, Pancreatitis (09/2012). On Social Hx the pt reports she is a non-smoker, doesn't drink outside of social situations. On Family Hx the pt reports a father Dx with Prostate Cancer in his 41's.  INTERVAL HISTORY:   Sherry Mosley is a wonderful 74 y.o. female who is here for evaluation and management of Non-Hodgkin's Lymphoma. We are joined  today by her husband.  The patient's last visit with [place] was on 08/09/19. The pt reports that she is doing well overall.  The pt reports she is good. She notices the lymph node but it has been there for a while and is not bothering her currently.   Of note since the patient's last visit, pt has had [place] Core Biopsy ([id number]) completed on 08/27/19.  On review of systems, pt reports insomnia and denies pedal edema, fevers, chills, night sweats, abdominal pain, irregular bowl habits, irregular urination habits or changes in frequency  and any other symptoms.   MEDICAL HISTORY:   Pancreatitis (10/20/12), unknown etiology  Postmenopausal Uterine fibroids Benign breast biopsy (1964) Right ankle arthrodesis (2012) Bilateral cataract extractions (2001, 2005) Hypercholesterolemia Essential hypertension  SURGICAL HISTORY: Inguinal LN biopsy TAH/BSO (1997)  SOCIAL HISTORY: Social History   Socioeconomic History  . Marital status: Married    Spouse name: Not on file  . Number of children: 2  . Years of education: Not on file  . Highest education level: Not on file  Occupational History  . 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 Smoking status: Never Smoker  . Smokeless tobacco: Never Used  Substance and Sexual Activity  . Alcohol use: Not on file  . Drug use: Not on file  . Sexual activity: Not on file  Other Topics Concern  . Not on file  Social History Narrative   1 daughter---pastor   Son --ER physician   3 stepsons   Current marriage since 2002      Has living will   Husband is health care POA. Son is alternate   Would accept resuscitation   No tube feeds if cognitively unaware   Social Determinants of Health   Financial Resource Strain:   . Difficulty of Paying Living Expenses:   Food Insecurity:   . Worried About Charity fundraiser in the Last Year:   . Arboriculturist in the Last Year:   Transportation Needs:   . Consulting civil engineer (Medical):   Marland Kitchen Lack of Transportation (Non-Medical):   Physical Activity:   . Days of Exercise per Week:   . Minutes of Exercise per Session:   Stress:   . Feeling of Stress :   Social Connections:   . Frequency of Communication with Friends and Family:   . Frequency of Social Gatherings with Friends and Family:   . Attends Religious Services:   . Active Member of Clubs or Organizations:   . Attends Archivist Meetings:   Marland Kitchen Marital Status:   Intimate Partner Violence:   . Fear of Current or Ex-Partner:   . Emotionally Abused:   Marland Kitchen Physically Abused:   . Sexually Abused:     FAMILY HISTORY: Family History  Problem Relation Age of Onset  . Alzheimer's disease Mother   . Heart failure Father   . Prostate cancer Father   . Diabetes Maternal Grandfather   . Breast cancer Neg Hx     ALLERGIES:  is allergic to cephalosporins  and penicillins.  PCN - significant rash  Cephalosporins - significant rash   MEDICATIONS:  Current Outpatient Medications  Medication Sig Dispense Refill  . Ascorbic Acid (VITAMIN C) 100 MG tablet Take 1 tablet by mouth daily.    . [place]-Glucosamine-Vit D (OSTEO BI-FLEX ONE PER DAY PO) Take by mouth.    . Cholecalciferol (VITAMIN D3) 50 MCG (2000 UT) capsule Take 1 capsule by mouth daily.    . citalopram (CELEXA) 20 MG tablet Take 1 tablet by mouth daily.    Marland Kitchen lisinopril (ZESTRIL) 10 MG tablet Take 1 tablet (10 mg total) by mouth daily. 30 tablet 5  . Loratadine 10 MG CAPS Take 1 tablet by mouth daily.    . nabumetone (RELAFEN) 750 MG tablet Take 1 tablet (750 mg total) by mouth 2 (two) times daily as needed. 30 tablet 3  . simvastatin (ZOCOR) 20 MG tablet Take 1 tablet (20 mg total) by mouth daily. 30 tablet 5  . temazepam (RESTORIL) 15 MG capsule Take 1 capsule by mouth daily.    . vitamin E 400 UNIT capsule Take 1 capsule by mouth daily.     No current facility-administered medications for this visit.  Lisinopril 26m  -  HTN  Simvastatin 273m- HLD  REVIEW OF SYSTEMS:   A 10+ POINT REVIEW OF SYSTEMS WAS OBTAINED including neurology, dermatology, psychiatry, cardiac, respiratory, lymph, extremities, GI, GU, Musculoskeletal, constitutional, breasts, reproductive, HEENT.  All pertinent positives are noted in the HPI.  All others are negative.   PHYSICAL EXAMINATION: ECOG PERFORMANCE STATUS: 0 - Asymptomatic  . Vitals:   08/31/19 1133  BP: 130/89  Pulse: 82  Resp: 17  Temp: 97.7 F (36.5 C)  SpO2: 98%   Filed Weights   08/31/19 1133  Weight: 151 lb 14.4 oz (68.9 kg)   .Body mass index is 26.49 kg/m.   GENERAL:alert, in no acute distress and comfortable SKIN: no acute rashes, no significant lesions EYES: conjunctiva are pink and non-injected, sclera anicteric OROPHARYNX: MMM, no exudates, no oropharyngeal erythema or ulceration NECK: supple, no JVD LYMPH:  no palpable lymphadenopathy in the cervical, axillary or inguinal regions LUNGS: clear to auscultation b/l with normal respiratory effort HEART: regular rate & rhythm ABDOMEN:  normoactive bowel sounds , non tender, not distended. Extremity: no pedal edema PSYCH: alert & oriented x 3 with fluent speech NEURO: no focal motor/sensory deficits  LABORATORY DATA:  I have reviewed the data as listed  . CBC Latest Ref Rng & Units 08/06/2019 04/16/2019 12/12/2018  WBC 4.0 - 10.5 K/uL 4.6 4.3 4.3  Hemoglobin 12.0 - 15.0 g/dL 14.3 14.4 14.6  Hematocrit 36 - 46 % 43.1 43.0 43.8  Platelets 150 - 400 K/uL 166 181 191    CMP Latest Ref Rng & Units 08/06/2019 04/16/2019 12/12/2018  Glucose 70 - 99 mg/dL 86 111(H) 84  BUN 8 - 23 mg/dL 17 20 19   Creatinine 0.44 - 1.00 mg/dL 0.93 0.84 0.91  Sodium 135 - 145 mmol/L 139 141 142  Potassium 3.5 - 5.1 mmol/L 4.2 4.1 4.6  Chloride 98 - 111 mmol/L 103 105 105  CO2 22 - 32 mmol/L 27 28 30   Calcium 8.9 - 10.3 mg/dL 9.2 9.1 9.5  Total Protein 6.5 - 8.1 g/dL 7.5 7.4 7.6  Total Bilirubin 0.3 - 1.2 mg/dL 0.6  0.3 0.5  Alkaline Phos 38 - 126 U/L 71 110 60  AST 15 - 41 U/L 44(H) 27 18  ALT 0 - 44 U/L 54(H) 67(H) 21  SURGICAL PATHOLOGY  CASE: [id number]  PATIENT: Tamber Vullo  Surgical Pathology Report   Specimen Submitted:  A. Lymph node, right inguinal   Clinical History: History of low grade B cell lymphoma, now with right  inguinal lymphadenopathy, post US guided BX   DIAGNOSIS:  A. LYMPH NODE, RIGHT INGUINAL; ULTRASOUND-GUIDED BIOPSY:  - LOW-GRADE B-CELL LYMPHOMA WITH FEATURES MOST SUGGESTIVE OF MARGINAL  ZONE LYMPHOMA.   Comment:  Sections demonstrate small to medium sized lymphocytes with monocytoid  features. Scattered plasma cells are present. Residual normal lymphoid  architecture is not identified.  Material was submitted for flow cytometric analysis which revealed a  monoclonal kappa B cell population that was negative for CD5, CD23, and  CD10 (see scanned report in [facility]).  A panel of immunohistochemical stains was performed with the following  pattern of immunoreactivity:  CD20: Positive in neoplastic lymphocytes  CD3: Highlights background T cells in a distribution similar to CD5  CD5: Highlights background T cells in a distribution similar to CD3  CD23: Highlights focal residual dendritic meshworks  CD10: Negative in neoplastic lymphocytes  BCL-2: Positive in neoplastic lymphocytes  BCL 6: Negative in neoplastic lymphocytes  Cyclin D1: Negative in neoplastic lymphocytes  Sox 11: Negative in neoplastic lymphocytes  Kappa-ISH: Positive in neoplastic lymphocytes  Lambda-ISH: Negative in neoplastic lymphocytes  Ki-67: Approximately 30%  The histologic features in conjunction with the immunophenotypic  findings support the diagnosis of a low-grade mature B-cell lymphoma  most suggestive of marginal zone lymphoma.   Sox 11 IHC and kappa / lambda ISH slides were prepared by [facility] [employer], [place], [place].  The remaining IHC  slides were prepared by [facility], [place], [place]. All controls stained appropriately.   This test was developed and its performance characteristics determined  by [facility]. It has not been cleared or approved by the [place] Food and Drug  Administration. The FDA does not require this test to go through  premarket FDA review. This test is used for clinical purposes. It should  not be regarded as investigational or for research. This laboratory is  certified under the Clinical Laboratory Improvement Amendments (CLIA) as  qualified to perform high complexity clinical laboratory testing.   GROSS DESCRIPTION:  A. Labeled: Right inguinal lymph node  Received: The specimen is received fresh on saline soaked gauze.  Tissue fragment(s): Multiple  Size: Range from 0.5 to 1.9 cm in length and 0.1 cm in diameter  Description: Received are multiple cores and fragments of tan-white soft  tissue. 1 touch prep with Diff-Quik stain is performed. Representative  sections are submitted for flow cytometry in RPMI. The remainder of the  specimen is submitted in cassettes 1-4 with 1 core per cassette.    Final Diagnosis performed by Quay Burow, MD.  Electronically signed  08/31/2019 1:38:09PM  The electronic signature indicates that the named Attending Pathologist  has evaluated the specimen  Technical component performed at [facility], [address], [place],  [place] [address] Lab: [phone] Dir: Rush Farmer, MD, MMM  Professional component performed at [facility], [facility], [place], [place], [place] [address] Lab: [phone]  Dir: Dellia Nims. Reuel Derby, MD   RADIOGRAPHIC STUDIES: I have personally reviewed the radiological images as listed and agreed with the findings in the report. CT Chest W Contrast  Result Date: 08/06/2019 CLINICAL DATA:  Follow-up low-grade B-cell lymphoma. Evaluate treatment response. EXAM: CT CHEST, ABDOMEN, AND  PELVIS WITH CONTRAST TECHNIQUE: Multidetector  CT imaging of the chest, abdomen and pelvis was performed following the standard protocol during bolus administration of intravenous contrast. CONTRAST:  175m OMNIPAQUE IOHEXOL 300 MG/ML  SOLN COMPARISON:  PET-CT on 12/29/2018 FINDINGS: CT CHEST FINDINGS Cardiovascular: No acute findings. Mediastinum/Lymph Nodes: No masses or pathologically enlarged lymph nodes identified. Lungs/Pleura: No pulmonary infiltrate or mass identified. Stable scarring seen in posterior right lower lobe. No effusion present. Musculoskeletal:  No suspicious bone lesions identified. CT ABDOMEN AND PELVIS FINDINGS Hepatobiliary: No masses identified. Tiny cysts in right and left lobe remain stable. Gallbladder is unremarkable. No evidence of biliary ductal dilatation. Pancreas:  No mass or inflammatory changes. Spleen:  Within normal limits in size and appearance. Adrenals/Urinary tract: Several small renal cysts again noted. No masses or hydronephrosis. Stomach/Bowel: Small hiatal hernia again noted. No evidence of bowel obstruction, inflammatory process, or abnormal fluid collections. Vascular/Lymphatic: Abdominal retroperitoneal in the aortocaval and paraaortic spaces shows increase since previous study. Index lymph node in the aortocaval space measures 10 mm on image 78/2, compared to 5 mm previously. Bulky lymphadenopathy is seen the left iliac lymph node chains, with largest node in the right external iliac chain measuring 3.8 cm short axis on image 97/2, compared to 2.6 cm previously. Bulky lymphadenopathy in the right inguinal region also increased since previous study, currently measuring 5.3 cm in short axis compared to 4.8 cm previously. No abdominal aortic aneurysm. Reproductive: Prior hysterectomy noted. Adnexal regions are unremarkable in appearance. Other:  None. Musculoskeletal:  No suspicious bone lesions identified. IMPRESSION: 1. Mild increase in abdominal retroperitoneal and  right pelvic lymphadenopathy. 2. No evidence of recurrent lymphoma within the chest. Electronically Signed   By: JMarlaine HindM.D.   On: 08/06/2019 14:41   CT Abdomen Pelvis W Contrast  Result Date: 08/06/2019 CLINICAL DATA:  Follow-up low-grade B-cell lymphoma. Evaluate treatment response. EXAM: CT CHEST, ABDOMEN, AND PELVIS WITH CONTRAST TECHNIQUE: Multidetector CT imaging of the chest, abdomen and pelvis was performed following the standard protocol during bolus administration of intravenous contrast. CONTRAST:  1066mOMNIPAQUE IOHEXOL 300 MG/ML  SOLN COMPARISON:  PET-CT on 12/29/2018 FINDINGS: CT CHEST FINDINGS Cardiovascular: No acute findings. Mediastinum/Lymph Nodes: No masses or pathologically enlarged lymph nodes identified. Lungs/Pleura: No pulmonary infiltrate or mass identified. Stable scarring seen in posterior right lower lobe. No effusion present. Musculoskeletal:  No suspicious bone lesions identified. CT ABDOMEN AND PELVIS FINDINGS Hepatobiliary: No masses identified. Tiny cysts in right and left lobe remain stable. Gallbladder is unremarkable. No evidence of biliary ductal dilatation. Pancreas:  No mass or inflammatory changes. Spleen:  Within normal limits in size and appearance. Adrenals/Urinary tract: Several small renal cysts again noted. No masses or hydronephrosis. Stomach/Bowel: Small hiatal hernia again noted. No evidence of bowel obstruction, inflammatory process, or abnormal fluid collections. Vascular/Lymphatic: Abdominal retroperitoneal in the aortocaval and paraaortic spaces shows increase since previous study. Index lymph node in the aortocaval space measures 10 mm on image 78/2, compared to 5 mm previously. Bulky lymphadenopathy is seen the left iliac lymph node chains, with largest node in the right external iliac chain measuring 3.8 cm short axis on image 97/2, compared to 2.6 cm previously. Bulky lymphadenopathy in the right inguinal region also increased since previous study,  currently measuring 5.3 cm in short axis compared to 4.8 cm previously. No abdominal aortic aneurysm. Reproductive: Prior hysterectomy noted. Adnexal regions are unremarkable in appearance. Other:  None. Musculoskeletal:  No suspicious bone lesions identified. IMPRESSION: 1. Mild increase in abdominal retroperitoneal and right pelvic lymphadenopathy.  2. No evidence of recurrent lymphoma within the chest. Electronically Signed   By: Marlaine Hind M.D.   On: 08/06/2019 14:41   [place] CORE BIOPSY (LYMPH NODES)  Result Date: 08/27/2019 INDICATION: History of diffuse B-cell lymphoma, now with right inguinal lymphadenopathy. Please perform ultrasound-guided right inguinal lymph node biopsy for tissue diagnostic purposes. EXAM: ULTRASOUND-GUIDED RIGHT INGUINAL LYMPH NODE BIOPSY COMPARISON:  CT abdomen and pelvis-08/26/2019 MEDICATIONS: None ANESTHESIA/SEDATION: None, per patient request COMPLICATIONS: None immediate. TECHNIQUE: Informed written consent was obtained from the patient after a discussion of the risks, benefits and alternatives to treatment. Questions regarding the procedure were encouraged and answered. Initial ultrasound scanning demonstrated bulky right inguinal lymphadenopathy with dominant right inguinal lymph node measuring at least 3.7 x 2.8 x 2.4 cm (image 11). An ultrasound image was saved for documentation purposes. The procedure was planned. A timeout was performed prior to the initiation of the procedure. The operative was prepped and draped in the usual sterile fashion, and a sterile drape was applied covering the operative field. A timeout was performed prior to the initiation of the procedure. Local anesthesia was provided with 1% lidocaine with epinephrine. Under direct ultrasound guidance, an 18 gauge core needle device was utilized to obtain to obtain 10 core needle biopsies of the dominant right inguinal lymph node. The samples were placed in saline and submitted to pathology. The needle was  removed and hemostasis was achieved with manual compression. Post procedure scan was negative for significant hematoma. A dressing was placed. The patient tolerated the procedure well without immediate postprocedural complication. IMPRESSION: Technically successful ultrasound guided biopsy of dominant right inguinal lymph node. Electronically Signed   By: Sandi Mariscal M.D.   On: 08/27/2019 15:35    ASSESSMENT & PLAN:  74 yo with   1) Atleast Stage IIIA Low grade B cell Non Hodgkins Lymphoma -consistent with Marginal zone lymphoma S/p R-CVP x  five cycles from 05/05/18 through 08/18/18.   PLAN: -Discussed 08/27/19 of [place] Core Biopsy ([id number])- diagnosis of low grade MZL -Advised right inguinal lymph node is bulkier -can cause leg swelling and cause pressure effects - none currently -Advised Antibody treatment with rituxan iI fsymptoms -Advised possibility of watching vs local ISRT to the bulkier rt inguinal and ext iliac LNadenopathy.  -Advised again that we don't typically move to treat unless pt is experiencing constitutional symptoms, organ threatening bulky disease, bothersome disease, or cytopenias -Advised watching symptoms leg swelling, pressure, discomfort -Advised on grade changes of lymphoma  -Advised short burst of prednisone can shrink lymph for short period of time if symptomatic -Recommended that the pt continue to sleep well, eat well, drink at least 48-64 oz of water each day, and walk 20-30 minutes each day.  -Will continue to monitor her labs and regular clinical examination - pt agrees  -Pt prefers to wait and watch  -Will see back in 5 months   FOLLOW UP: RTC with Dr Irene Limbo with labs in 5 months  All of the patient's questions were answered with apparent satisfaction. The patient knows to call the clinic with any problems, questions or concerns.  Sullivan Lone MD MS AAHIVMS [facility] Hematology/Oncology Physician [facility]  (Office):        [phone] (Work cell):  [phone] (Fax):           [phone]  08/31/2019 11:53 AM  I, Dawayne Cirri am acting as a scribe for Dr. Sullivan Lone.   .I have reviewed the above documentation for accuracy and completeness,  and I agree with the above. Brunetta Genera MD

## 2019-08-31 ENCOUNTER — Other Ambulatory Visit: Payer: Self-pay

## 2019-08-31 ENCOUNTER — Encounter: Payer: Self-pay | Admitting: Hematology

## 2019-08-31 ENCOUNTER — Inpatient Hospital Stay: Payer: Medicare PPO | Attending: Hematology | Admitting: Hematology

## 2019-08-31 VITALS — BP 130/89 | HR 82 | Temp 97.7°F | Resp 17 | Ht 63.5 in | Wt 151.9 lb

## 2019-08-31 DIAGNOSIS — I10 Essential (primary) hypertension: Secondary | ICD-10-CM | POA: Diagnosis not present

## 2019-08-31 DIAGNOSIS — G4700 Insomnia, unspecified: Secondary | ICD-10-CM | POA: Diagnosis not present

## 2019-08-31 DIAGNOSIS — Z79899 Other long term (current) drug therapy: Secondary | ICD-10-CM | POA: Diagnosis not present

## 2019-08-31 DIAGNOSIS — Z8249 Family history of ischemic heart disease and other diseases of the circulatory system: Secondary | ICD-10-CM | POA: Insufficient documentation

## 2019-08-31 DIAGNOSIS — E785 Hyperlipidemia, unspecified: Secondary | ICD-10-CM | POA: Diagnosis not present

## 2019-08-31 DIAGNOSIS — Z8042 Family history of malignant neoplasm of prostate: Secondary | ICD-10-CM | POA: Insufficient documentation

## 2019-08-31 DIAGNOSIS — C8580 Other specified types of non-Hodgkin lymphoma, unspecified site: Secondary | ICD-10-CM | POA: Diagnosis not present

## 2019-08-31 DIAGNOSIS — E7800 Pure hypercholesterolemia, unspecified: Secondary | ICD-10-CM | POA: Diagnosis not present

## 2019-08-31 DIAGNOSIS — Z833 Family history of diabetes mellitus: Secondary | ICD-10-CM | POA: Diagnosis not present

## 2019-08-31 DIAGNOSIS — C8590 Non-Hodgkin lymphoma, unspecified, unspecified site: Secondary | ICD-10-CM | POA: Insufficient documentation

## 2019-08-31 LAB — SURGICAL PATHOLOGY

## 2019-09-04 ENCOUNTER — Encounter: Payer: Self-pay | Admitting: Pathology

## 2019-09-07 ENCOUNTER — Telehealth: Payer: Self-pay | Admitting: Hematology

## 2019-09-07 NOTE — Telephone Encounter (Signed)
Scheduled per 07/09 los, patient has been called and voicemail was left.

## 2019-12-13 DIAGNOSIS — F5101 Primary insomnia: Secondary | ICD-10-CM | POA: Diagnosis not present

## 2020-01-30 ENCOUNTER — Telehealth: Payer: Self-pay | Admitting: Hematology

## 2020-01-30 NOTE — Telephone Encounter (Signed)
Called pt per 12/1 sch msg - left message for patient to call back to reschedule appt.

## 2020-01-31 ENCOUNTER — Inpatient Hospital Stay: Payer: Medicare PPO | Attending: Family Medicine

## 2020-01-31 ENCOUNTER — Inpatient Hospital Stay: Payer: Medicare PPO | Admitting: Hematology

## 2020-02-05 ENCOUNTER — Telehealth: Payer: Self-pay | Admitting: Hematology

## 2020-02-05 NOTE — Telephone Encounter (Signed)
R/s appt per 12/6 sch msg - pt is aware of appt date and time

## 2020-02-13 DIAGNOSIS — Z0000 Encounter for general adult medical examination without abnormal findings: Secondary | ICD-10-CM | POA: Insufficient documentation

## 2020-02-13 DIAGNOSIS — Z66 Do not resuscitate: Secondary | ICD-10-CM | POA: Insufficient documentation

## 2020-02-13 DIAGNOSIS — Z23 Encounter for immunization: Secondary | ICD-10-CM | POA: Diagnosis not present

## 2020-02-13 DIAGNOSIS — I10 Essential (primary) hypertension: Secondary | ICD-10-CM | POA: Diagnosis not present

## 2020-03-03 ENCOUNTER — Telehealth: Payer: Self-pay | Admitting: Hematology

## 2020-03-03 ENCOUNTER — Other Ambulatory Visit: Payer: Self-pay

## 2020-03-03 ENCOUNTER — Inpatient Hospital Stay: Payer: Medicare Other | Attending: Hematology

## 2020-03-03 ENCOUNTER — Inpatient Hospital Stay (HOSPITAL_BASED_OUTPATIENT_CLINIC_OR_DEPARTMENT_OTHER): Payer: Medicare Other | Admitting: Hematology

## 2020-03-03 VITALS — BP 126/74 | HR 89 | Temp 97.8°F | Resp 18 | Ht 63.0 in | Wt 152.6 lb

## 2020-03-03 DIAGNOSIS — C8590 Non-Hodgkin lymphoma, unspecified, unspecified site: Secondary | ICD-10-CM | POA: Insufficient documentation

## 2020-03-03 DIAGNOSIS — E7800 Pure hypercholesterolemia, unspecified: Secondary | ICD-10-CM | POA: Insufficient documentation

## 2020-03-03 DIAGNOSIS — E785 Hyperlipidemia, unspecified: Secondary | ICD-10-CM | POA: Insufficient documentation

## 2020-03-03 DIAGNOSIS — I10 Essential (primary) hypertension: Secondary | ICD-10-CM | POA: Diagnosis not present

## 2020-03-03 DIAGNOSIS — G4700 Insomnia, unspecified: Secondary | ICD-10-CM | POA: Insufficient documentation

## 2020-03-03 DIAGNOSIS — C8580 Other specified types of non-Hodgkin lymphoma, unspecified site: Secondary | ICD-10-CM

## 2020-03-03 DIAGNOSIS — Z79899 Other long term (current) drug therapy: Secondary | ICD-10-CM | POA: Diagnosis not present

## 2020-03-03 LAB — CBC WITH DIFFERENTIAL/PLATELET
Abs Immature Granulocytes: 0.01 10*3/uL (ref 0.00–0.07)
Basophils Absolute: 0 10*3/uL (ref 0.0–0.1)
Basophils Relative: 0 %
Eosinophils Absolute: 0.1 10*3/uL (ref 0.0–0.5)
Eosinophils Relative: 2 %
HCT: 40.4 % (ref 36.0–46.0)
Hemoglobin: 13.3 g/dL (ref 12.0–15.0)
Immature Granulocytes: 0 %
Lymphocytes Relative: 25 %
Lymphs Abs: 1.2 10*3/uL (ref 0.7–4.0)
MCH: 30.6 pg (ref 26.0–34.0)
MCHC: 32.9 g/dL (ref 30.0–36.0)
MCV: 93.1 fL (ref 80.0–100.0)
Monocytes Absolute: 0.5 10*3/uL (ref 0.1–1.0)
Monocytes Relative: 10 %
Neutro Abs: 3 10*3/uL (ref 1.7–7.7)
Neutrophils Relative %: 63 %
Platelets: 143 10*3/uL — ABNORMAL LOW (ref 150–400)
RBC: 4.34 MIL/uL (ref 3.87–5.11)
RDW: 12.7 % (ref 11.5–15.5)
WBC: 4.7 10*3/uL (ref 4.0–10.5)
nRBC: 0 % (ref 0.0–0.2)

## 2020-03-03 LAB — CMP (CANCER CENTER ONLY)
ALT: 36 U/L (ref 0–44)
AST: 27 U/L (ref 15–41)
Albumin: 3.3 g/dL — ABNORMAL LOW (ref 3.5–5.0)
Alkaline Phosphatase: 95 U/L (ref 38–126)
Anion gap: 6 (ref 5–15)
BUN: 19 mg/dL (ref 8–23)
CO2: 28 mmol/L (ref 22–32)
Calcium: 8.8 mg/dL — ABNORMAL LOW (ref 8.9–10.3)
Chloride: 104 mmol/L (ref 98–111)
Creatinine: 1.05 mg/dL — ABNORMAL HIGH (ref 0.44–1.00)
GFR, Estimated: 55 mL/min — ABNORMAL LOW (ref >60)
Glucose, Bld: 167 mg/dL — ABNORMAL HIGH (ref 70–99)
Potassium: 4.5 mmol/L (ref 3.5–5.1)
Sodium: 138 mmol/L (ref 135–145)
Total Bilirubin: 0.5 mg/dL (ref 0.3–1.2)
Total Protein: 7.7 g/dL (ref 6.5–8.1)

## 2020-03-03 LAB — LACTATE DEHYDROGENASE: LDH: 221 U/L — ABNORMAL HIGH (ref 98–192)

## 2020-03-03 MED ORDER — TEMAZEPAM 15 MG PO CAPS: 15.0000 mg | ORAL_CAPSULE | Freq: Every evening | ORAL | 2 refills | Status: DC | PRN

## 2020-03-03 NOTE — Progress Notes (Signed)
HEMATOLOGY/ONCOLOGY CONSULTATION NOTE  Date of Service: 03/03/2020  Patient Care Team: Dion Body, MD as PCP - General (Family Medicine)  CHIEF COMPLAINTS/PURPOSE OF CONSULTATION:  Non-Hodgkin's Lymphoma  ONCOLOGIC Hx:  B-cell lymphoproliferative disorder, NOS, diagnosed originally by fine needle aspiration of right groin lymph node on 10/25/12  a. S/p excisional biopsy of right inguinal lymph node on 12/08/12 with finding of B cell lymphoproliferative disorder with indistinct phenotype.  b. PET and CT scans performed on 12/21/12 consistent with clinical stage disease with adenopathy in both inguinal and femoral areas and left posterior triangle in the neck and probably the left axilla.  c. Completion of five cycles of CVP-R from 05/05/18 through 08/18/18.   12/08/2012 Surgical pathology of right groin lymph node found: Abnormal Lymphoma FISH result, loss of IGH/14q DNA sequence   HISTORY OF PRESENTING ILLNESS:   Sherry Mosley is a wonderful 75 y.o. female who has been referred to [place] by Dr Seth Bake for evaluation and management of Non-Hodgkin's Lymphoma. The pt reports that she is doing well overall.  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 She has recently moved to [place] on 11/07/2018. She has not had the chance to begin care with a PCP.   The pt reports that she had Pancreatitis in 09/2012 which send her to the ER. Pt also notes that she had a rash that began in 08/2012. Her son, who is also a physician, thought that she had passing viral Pancreatitis due to her symptoms. She then had inguinal lymphadenopathy that was incidentally noted but was not causing problems. She was diagnosed with Non-Hodgkin's Lymphoma in 09/2012 after an excisional biopsy of right inguinal lymph node and a PET/CT Scan. They were following her until 06/2018 when they did a repeat CT scan and found enlarging lymph nodes. Pt denies having any symptoms at that time. They then  began R-CVP in March due to the pt's insistance but she began feeling fatigued and was in bed for >50% of the time. She chose to stop treatment after fifth cycle due to her moving and knowing that she would not be able to do so with the severe fatigue. Her last treatment was in June. She does not currently feel any enlarged lymph nodes but she notes that she doesn't examine herself often. Pt also had some peripheral neuropathy with R-CVP which has since resolved. She denies any transfusion needs or infection issues during treatments. Pt did have some insomnia with Prednisone at 100 mg so she was lowered to 60 mg which was much better. She did not have any cytopenias related to her treatment.  Pt is on Lisinopril 59m for her Essential HTN and Simvastatin 230mfor HLD. Pt has continued to stay up to date with her age-appropriate cancer screenings. Her father was diagnosed with stage 4 Prostate Cancer in his 7056'snd he lived for 11 years after diagnosis. There is no other family history of cancer or blood/immunological disorders. Pt has not been on any other immunosuppressive drugs and has never had any rheumatological issues. Pt has never smoked and does not drink outside of social situations.   Most recent lab results (09/12/2018) of CBC is as follows: all values are WNL except for WBC at 4.7K, ANC at 2612, Hgb at 13.4, PLTs at 202K.  On review of systems, pt denies oral thrush, abdominal pain, change in bowel habits, fever, chills, drenching night sweats, skin rashes, fatigue, numbness/tingling in her fingers and any other symptoms.  On PMHx the pt reports HTN, HLD, Pancreatitis (09/2012). On Social Hx the pt reports she is a non-smoker, doesn't drink outside of social situations. On Family Hx the pt reports a father Dx with Prostate Cancer in his 58's.  INTERVAL HISTORY:   Sherry Mosley is a wonderful 75 y.o. 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 We are joined  today by her husband. The patient's last visit with [place] was on 08/31/2019. The pt reports that she is doing well overall.  The pt reports some fatigue that is not new or limiting her activities at this time. Pt has received the COVID19 booster and annual flu vaccine. She is also up to date with her Pneumonia vaccines. Pt continues to use 15 mg Temazepam for sleeplessness.  Lab results today (03/03/20) of CBC w/diff and CMP is as follows: all values are WNL except for PLT at 143K, Glucose at 167, Creatinine at 1.05, Calcium at 8.8, Albumin at 3.3, GFR Est at 55. 03/03/2020 LDH at 221.  On review of systems, pt reports fatigue and denies fevers, chills, night sweats, unexpected weight loss, new lumps/bumps, rash, abdominal pain, urinary habit changes, bowel habit changes and any other symptoms.    MEDICAL HISTORY:   Pancreatitis (10/20/12), unknown etiology  Postmenopausal Uterine fibroids Benign breast biopsy (1964) Right ankle arthrodesis (2012) Bilateral cataract extractions (2001, 2005) Hypercholesterolemia Essential hypertension  SURGICAL HISTORY: Inguinal LN biopsy TAH/BSO (1997)  SOCIAL HISTORY: Social History   Socioeconomic History  . Marital status: Married    Spouse name: Not on file  . Number of children: 2  . Years of education: Not on file  . Highest education level: Not on file  Occupational History  . 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 Smoking status: Never Smoker  . Smokeless tobacco: Never Used  Substance and Sexual Activity  . Alcohol use: Not on file  . Drug use: Not on file  . Sexual activity: Not on file  Other Topics Concern  . Not on file  Social History Narrative   1 daughter---pastor   Son --ER physician   3 stepsons   Current marriage since 2002      Has living will   Husband is health care POA. Son is alternate   Would accept resuscitation   No tube feeds if cognitively unaware   Social  Determinants of Health   Financial Resource Strain: Not on file  Food Insecurity: Not on file  Transportation Needs: Not on file  Physical Activity: Not on file  Stress: Not on file  Social Connections: Not on file  Intimate Partner Violence: Not on file    FAMILY HISTORY: Family History  Problem Relation Age of Onset  . Alzheimer's disease Mother   . Heart failure Father   . Prostate cancer Father   . Diabetes Maternal Grandfather   . Breast cancer Neg Hx     ALLERGIES:  is allergic to cephalosporins and penicillins.  PCN - significant rash  Cephalosporins - significant rash   MEDICATIONS:  Current Outpatient Medications  Medication Sig Dispense Refill  . Ascorbic Acid (VITAMIN C) 100 MG tablet Take 1 tablet by mouth daily.    . Boswellia-Glucosamine-Vit D (OSTEO BI-FLEX ONE PER DAY PO) Take by mouth.    . Cholecalciferol (VITAMIN D3) 50 MCG (2000 UT) capsule Take 1 capsule by mouth daily.    . citalopram (CELEXA) 20 MG tablet Take 1 tablet  by mouth daily.    Marland Kitchen lisinopril (ZESTRIL) 10 MG tablet Take 1 tablet (10 mg total) by mouth daily. 30 tablet 5  . Loratadine 10 MG CAPS Take 1 tablet by mouth daily.    . nabumetone (RELAFEN) 750 MG tablet Take 1 tablet (750 mg total) by mouth 2 (two) times daily as needed. 30 tablet 3  . simvastatin (ZOCOR) 20 MG tablet Take 1 tablet (20 mg total) by mouth daily. 30 tablet 5  . temazepam (RESTORIL) 15 MG capsule Take 1 capsule (15 mg total) by mouth at bedtime as needed for sleep. 30 capsule 2  . vitamin E 400 UNIT capsule Take 1 capsule by mouth daily.     No current facility-administered medications for this visit.  Lisinopril 51m -  HTN  Simvastatin 263m- HLD  REVIEW OF SYSTEMS:   A 10+ POINT REVIEW OF SYSTEMS WAS OBTAINED including neurology, dermatology, psychiatry, cardiac, respiratory, lymph, extremities, GI, GU, Musculoskeletal, constitutional, breasts, reproductive, HEENT.  All pertinent positives are noted in the HPI.  All  others are negative.   PHYSICAL EXAMINATION: ECOG PERFORMANCE STATUS: 0 - Asymptomatic  . Vitals:   03/03/20 1400  BP: 126/74  Pulse: 89  Resp: 18  Temp: 97.8 F (36.6 C)  SpO2: 98%   Filed Weights   03/03/20 1400  Weight: 152 lb 9.6 oz (69.2 kg)   .Body mass index is 27.03 kg/m.   GENERAL:alert, in no acute distress and comfortable SKIN: no acute rashes, no significant lesions EYES: conjunctiva are pink and non-injected, sclera anicteric OROPHARYNX: MMM, no exudates, no oropharyngeal erythema or ulceration NECK: supple, no JVD LYMPH:  no palpable lymphadenopathy in the cervical or axillary regions. Stable right inguinal lymphadenopathy. LUNGS: clear to auscultation b/l with normal respiratory effort HEART: regular rate & rhythm ABDOMEN:  normoactive bowel sounds , non tender, not distended. No palpable hepatosplenomegaly.  Extremity: no pedal edema PSYCH: alert & oriented x 3 with fluent speech NEURO: no focal motor/sensory deficits  LABORATORY DATA:  I have reviewed the data as listed  . CBC Latest Ref Rng & Units 03/03/2020 08/06/2019 04/16/2019  WBC 4.0 - 10.5 K/uL 4.7 4.6 4.3  Hemoglobin 12.0 - 15.0 g/dL 13.3 14.3 14.4  Hematocrit 36.0 - 46.0 % 40.4 43.1 43.0  Platelets 150 - 400 K/uL 143(L) 166 181    CMP Latest Ref Rng & Units 03/03/2020 08/06/2019 04/16/2019  Glucose 70 - 99 mg/dL 167(H) 86 111(H)  BUN 8 - 23 mg/dL _0 Creatinine 0.44 - 1.00 mg/dL 1.05(H) 0.93 0.84  Sodium 135 - 145 mmol/L 138 139 141  Potassium 3.5 - 5.1 mmol/L 4.5 4.2 4.1  Chloride 98 - 111 mmol/L 104 103 105  CO2 22 - 32 mmol/L _1 Calcium 8.9 - 10.3 mg/dL 8.8(L) 9.2 9.1  Total Protein 6.5 - 8.1 g/dL 7.7 7.5 7.4  Total Bilirubin 0.3 - 1.2 mg/dL 0.5 0.6 0.3  Alkaline Phos 38 - 126 U/L 95 71 110  AST 15 - 41 U/L 27 44(H) 27  ALT 0 - 44 U/L 36 54(H) 67(H)   SURGICAL PATHOLOGY  CASE: [id number]  PATIENT: Sherry Mosley  Surgical Pathology Report   Specimen Submitted:  A.  Lymph node, right inguinal   Clinical History: History of low grade B cell lymphoma, now with right  inguinal lymphadenopathy, post [place]ided BX   DIAGNOSIS:  A. LYMPH NODE, RIGHT INGUINAL; ULTRASOUND-GUIDED BIOPSY:  - LOW-GRADE B-CELL LYMPHOMA WITH FEATURES MOST SUGGESTIVE OF MARGINAL  ZONE LYMPHOMA.   Comment:  Sections demonstrate small to medium sized lymphocytes with monocytoid  features. Scattered plasma cells are present. Residual normal lymphoid  architecture is not identified.  Material was submitted for flow cytometric analysis which revealed a  monoclonal kappa B cell population that was negative for CD5, CD23, and  CD10 (see scanned report in [facility]).  A panel of immunohistochemical stains was performed with the following  pattern of immunoreactivity:  CD20: Positive in neoplastic lymphocytes  CD3: Highlights background T cells in a distribution similar to CD5  CD5: Highlights background T cells in a distribution similar to CD3  CD23: Highlights focal residual dendritic meshworks  CD10: Negative in neoplastic lymphocytes  BCL-2: Positive in neoplastic lymphocytes  BCL 6: Negative in neoplastic lymphocytes  Cyclin D1: Negative in neoplastic lymphocytes  Sox 11: Negative in neoplastic lymphocytes  Kappa-ISH: Positive in neoplastic lymphocytes  Lambda-ISH: Negative in neoplastic lymphocytes  Ki-67: Approximately 30%  The histologic features in conjunction with the immunophenotypic  findings support the diagnosis of a low-grade mature B-cell lymphoma  most suggestive of marginal zone lymphoma.   Sox 11 IHC and kappa / lambda ISH slides were prepared by [place] [facility] [employer], [place], [place].  The remaining IHC slides were prepared by [facility], [place], [place]. All controls stained appropriately.   This test was developed and its performance characteristics determined  by [facility]. It has not been  cleared or approved by the [place] Food and Drug  Administration. The FDA does not require this test to go through  premarket FDA review. This test is used for clinical purposes. It should  not be regarded as investigational or for research. This laboratory is  certified under the Clinical Laboratory Improvement Amendments (CLIA) as  qualified to perform high complexity clinical laboratory testing.   GROSS DESCRIPTION:  A. Labeled: Right inguinal lymph node  Received: The specimen is received fresh on saline soaked gauze.  Tissue fragment(s): Multiple  Size: Range from 0.5 to 1.9 cm in length and 0.1 cm in diameter  Description: Received are multiple cores and fragments of tan-white soft  tissue. 1 touch prep with Diff-Quik stain is performed. Representative  sections are submitted for flow cytometry in RPMI. The remainder of the  specimen is submitted in cassettes 1-4 with 1 core per cassette.    Final Diagnosis performed by Quay Burow, MD.  Electronically signed  08/31/2019 1:38:09PM  The electronic signature indicates that the named Attending Pathologist  has evaluated the specimen  Technical component performed at [facility], [address], [place],  [place] [address] Lab: [phone] Dir: Rush Farmer, MD, MMM  Professional component performed at [facility], [facility], [place], [place],  [address] Lab: [phone]  Dir: Dellia Nims. Reuel Derby, MD   RADIOGRAPHIC STUDIES: I have personally reviewed the radiological images as listed and agreed with the findings in the report. No results found.  ASSESSMENT & PLAN:  75 y.o. with   1) Atleast Stage IIIA Low grade B cell Non Hodgkins Lymphoma -consistent with Marginal zone lymphoma S/p R-CVP x  five cycles from 05/05/18 through 08/18/18.   PLAN: -Discussed pt labwork today, 03/03/20; all values are WNL except for PLT at 143K, Glucose at 167, Creatinine at 1.05, Calcium at 8.8, Albumin at 3.3, GFR Est at  55.  -Discussed 03/03/2020 LDH at 221 - stable.  -No lab or clinical evidence of significant low grade Marginal Zone Lymphoma progression at  this time. -Advised pt that local radiation or a short-course of steroids could be used to address the right inguinal lymph node if bothersome. -Discussed systemic treatment options for non-localized disease.  -Will repeat imaging every 12-18 months in the absence of new symptoms. -Rx Temazepam -Will see back in 6 months with labs   FOLLOW UP: RTC with Dr Irene Limbo with labs in 6 months   The total time spent in the appt was 20 minutes and more than 50% was on counseling and direct patient cares.  All of the patient's questions were answered with apparent satisfaction. The patient knows to call the clinic with any problems, questions or concerns.   Sullivan Lone MD [place] AAHIVMS [facility] Hematology/Oncology Physician [facility]  (Office):       [phone] (Work cell):  [phone] (Fax):           [phone]  03/03/2020 2:42 PM  I, Yevette Edwards, am acting as a scribe for Dr. Sullivan Lone.   .I have reviewed the above documentation for accuracy and completeness, and I agree with the above. Brunetta Genera MD

## 2020-03-03 NOTE — Telephone Encounter (Signed)
Scheduled appointments per 1/3 los. Spoke to patient who is aware of appointment date and time.  °

## 2020-03-04 DIAGNOSIS — M8588 Other specified disorders of bone density and structure, other site: Secondary | ICD-10-CM | POA: Insufficient documentation

## 2020-03-20 ENCOUNTER — Other Ambulatory Visit: Payer: Self-pay | Admitting: Internal Medicine

## 2020-04-04 ENCOUNTER — Other Ambulatory Visit: Payer: Self-pay | Admitting: Internal Medicine

## 2020-04-16 ENCOUNTER — Other Ambulatory Visit: Payer: Self-pay | Admitting: Internal Medicine

## 2020-05-26 ENCOUNTER — Other Ambulatory Visit: Payer: Self-pay | Admitting: Internal Medicine

## 2020-06-25 ENCOUNTER — Other Ambulatory Visit: Payer: Self-pay | Admitting: Internal Medicine

## 2020-06-27 ENCOUNTER — Other Ambulatory Visit: Payer: Self-pay | Admitting: Hematology

## 2020-06-27 DIAGNOSIS — C8580 Other specified types of non-Hodgkin lymphoma, unspecified site: Secondary | ICD-10-CM

## 2020-07-23 ENCOUNTER — Other Ambulatory Visit: Payer: Self-pay | Admitting: Internal Medicine

## 2020-08-05 ENCOUNTER — Ambulatory Visit: Payer: Medicare Other | Admitting: Nurse Practitioner

## 2020-08-05 ENCOUNTER — Other Ambulatory Visit: Payer: Self-pay

## 2020-08-05 ENCOUNTER — Encounter: Payer: Self-pay | Admitting: Nurse Practitioner

## 2020-08-05 VITALS — BP 110/80 | HR 81 | Temp 98.5°F | Ht 63.0 in | Wt 149.0 lb

## 2020-08-05 DIAGNOSIS — R197 Diarrhea, unspecified: Secondary | ICD-10-CM

## 2020-08-05 NOTE — Progress Notes (Signed)
Careteam: Patient Care Team: Dion Body, MD as PCP - General (Family Medicine)  Advanced Directive information    Allergies  Allergen Reactions  . Cephalosporins     Rash  . Penicillins     Significant Rash    Chief Complaint  Patient presents with  . Acute Visit    Patient having diarrhea for a week. Patient thinks it may be an allergy or infection. May be a dairy allergy because it started after drinking a milkshake.Patient otherwise feels fine. Patient would like to have stool culture.     HPI: Patient is a 75 y.o. female seen in today at the [facility] for due to diarrhea.  Hx of diverticulosis but never have flare.  Thought diarrhea would go away but it has not.  2 episodes by 10 am today. On average 5 or 6 a day.  Has some consistency and "a tad" formed.   Feels like this is due to infection or allergy.  She has avoided dairy to see if this is the cause.  Has been going on for a week.  She had a milkshake and the diarrhea was worse.  Issues with bowel control once.  Reports green stool. No fevers, no abdominal pain.  No recent antibiotic.  No travel.  No new medications.  She tends to be more constipation and on probiotic  No major changes in diet.  No Nausea, vomiting or sick contacts.  No blood in stool.  Reports she does not smell the stool but husband reports it was foul.    Colonoscopy when she was 52 and was told her she did not need another.   Reports her previous provider was Dr Netty Starring but she does not wish to be followed by him. Request his name removed from her chart.  Review of Systems:  Review of Systems  Constitutional: Negative for chills, fever and weight loss.  HENT: Negative for tinnitus.   Respiratory: Negative for cough, sputum production and shortness of breath.   Cardiovascular: Negative for chest pain, palpitations and leg swelling.  Gastrointestinal: Positive for diarrhea. Negative for abdominal pain,  constipation, heartburn, nausea and vomiting.  Genitourinary: Negative for dysuria, frequency and urgency.  Musculoskeletal: Negative for back pain, falls, joint pain and myalgias.  Skin: Negative.   Neurological: Negative for dizziness and headaches.  Psychiatric/Behavioral: Negative for depression and memory loss. The patient does not have insomnia.     Past Medical History:  Diagnosis Date  . Generalized osteoarthritis   . Hyperlipidemia   . Hypertension   . Mood disorder ([place])   . Non Hodgkin's lymphoma ([place])   . Sleep disorder    Past Surgical History:  Procedure Laterality Date  . ABDOMINAL HYSTERECTOMY  1997  . ANKLE ARTHRODESIS Right 2012  . BREAST BIOPSY Left 1969   fibroadenoma  . CATARACT EXTRACTION W/ INTRAOCULAR LENS IMPLANT Bilateral   . LYMPH NODE BIOPSY  2014   excisional biopsy right groin  . Thumb surgery     removal of part of extensor tendon   Social History:   reports that she has never smoked. She has never used smokeless tobacco. No history on file for alcohol use and drug use.  Family History  Problem Relation Age of Onset  . Alzheimer's disease Mother   . Heart failure Father   . Prostate cancer Father   . Diabetes Maternal Grandfather   . Breast cancer Neg Hx     Medications: Patient's Medications  New Prescriptions  No medications on file  Previous Medications   ASCORBIC ACID (VITAMIN C) 1000 MG TABLET    Take 1 tablet by mouth daily.   BOSWELLIA-GLUCOSAMINE-VIT D (OSTEO BI-FLEX ONE PER DAY PO)    Take by mouth.   CHOLECALCIFEROL (VITAMIN D3) 50 MCG (2000 UT) CAPSULE    Take 1 capsule by mouth daily.   CITALOPRAM (CELEXA) 10 MG TABLET    Take 1 tablet by mouth daily.   LISINOPRIL (ZESTRIL) 5 MG TABLET    Take 5 mg by mouth daily.   LORATADINE 10 MG CAPS    Take 1 tablet by mouth daily.   SIMVASTATIN (ZOCOR) 20 MG TABLET    Take 1 tablet (20 mg total) by mouth daily.   TEMAZEPAM (RESTORIL) 15 MG CAPSULE    TAKE 1 CAPSULE BY MOUTH AT  BEDTIME AS NEEDED FOR SLEEP.   VITAMIN E 400 UNIT CAPSULE    Take 1 capsule by mouth daily.  Modified Medications   No medications on file  Discontinued Medications   LISINOPRIL (ZESTRIL) 10 MG TABLET    Take 1 tablet (10 mg total) by mouth daily. NEEDS OFFICE VISIT   NABUMETONE (RELAFEN) 750 MG TABLET    Take 1 tablet (750 mg total) by mouth 2 (two) times daily as needed.    Physical Exam:  Vitals:   08/05/20 0940  BP: 110/80  Pulse: 81  Temp: 98.5 F (36.9 C)  TempSrc: Oral  SpO2: 97%  Weight: 149 lb (67.6 kg)  Height: 5\' 3"  (1.6 m)   Body mass index is 26.39 kg/m. Wt Readings from Last 3 Encounters:  08/05/20 149 lb (67.6 kg)  03/03/20 152 lb 9.6 oz (69.2 kg)  08/31/19 151 lb 14.4 oz (68.9 kg)    Physical Exam Constitutional:      General: She is not in acute distress.    Appearance: She is well-developed. She is not diaphoretic.  HENT:     Head: Normocephalic and atraumatic.     Mouth/Throat:     Pharynx: No oropharyngeal exudate.  Eyes:     Conjunctiva/sclera: Conjunctivae normal.     Pupils: Pupils are equal, round, and reactive to light.  Cardiovascular:     Rate and Rhythm: Normal rate and regular rhythm.     Heart sounds: Normal heart sounds.  Pulmonary:     Effort: Pulmonary effort is normal.     Breath sounds: Normal breath sounds.  Abdominal:     General: Bowel sounds are normal. There is no distension.     Palpations: Abdomen is soft. There is no mass.     Tenderness: There is no abdominal tenderness. There is no guarding or rebound.  Musculoskeletal:        General: No tenderness.     Cervical back: Normal range of motion and neck supple.  Skin:    General: Skin is warm and dry.  Neurological:     Mental Status: She is alert and oriented to person, place, and time.     Labs reviewed: Basic Metabolic Panel: Recent Labs    08/06/19 1056 03/03/20 1332  NA 139 138  K 4.2 4.5  CL 103 104  CO2 27 28  GLUCOSE 86 167*  BUN 17 19   CREATININE 0.93 1.05*  CALCIUM 9.2 8.8*   Liver Function Tests: Recent Labs    08/06/19 1056 03/03/20 1332  AST 44* 27  ALT 54* 36  ALKPHOS 71 95  BILITOT 0.6 0.5  PROT 7.5 7.7  ALBUMIN  3.6 3.3*   No results for input(s): LIPASE, AMYLASE in the last 8760 hours. No results for input(s): AMMONIA in the last 8760 hours. CBC: Recent Labs    08/06/19 1056 03/03/20 1332  WBC 4.6 4.7  NEUTROABS 2.6 3.0  HGB 14.3 13.3  HCT 43.1 40.4  MCV 94.9 93.1  PLT 166 143*   Lipid Panel: No results for input(s): CHOL, HDL, LDLCALC, TRIG, CHOLHDL, LDLDIRECT in the last 8760 hours. TSH: No results for input(s): TSH in the last 8760 hours. A1C: No results found for: HGBA1C   Assessment/Plan 1. Diarrhea, unspecified type -diarrhea for 1 week without improvement. Overall feels well and without other symptoms. Eating and drinking well.  -will get her to obtain stool and send for culture, cdiff and O&P at this time.  -will follow up if symptoms worsen or go to urgent care for systemic symptoms.   Carlos American. [place], [facility] [phone]

## 2020-09-01 NOTE — Progress Notes (Signed)
HEMATOLOGY/ONCOLOGY CONSULTATION NOTE  Date of Service: 09/02/2020  No care team member to display  CHIEF COMPLAINTS/PURPOSE OF CONSULTATION:  Non-Hodgkin's Lymphoma  ONCOLOGIC Hx:  B-cell lymphoproliferative disorder, NOS, diagnosed originally by fine needle aspiration of right groin lymph node on 10/25/12  S/p excisional biopsy of right inguinal lymph node on 12/08/12 with finding of B cell lymphoproliferative disorder with indistinct phenotype.  PET and CT scans performed on 12/21/12 consistent with clinical stage disease with adenopathy in both inguinal and femoral areas and left posterior triangle in the neck and probably the left axilla.  Completion of five cycles of CVP-R from 05/05/18 through 08/18/18.   12/08/2012 Surgical pathology of right groin lymph node found: Abnormal Lymphoma FISH result, loss of IGH/14q DNA sequence   HISTORY OF PRESENTING ILLNESS:   Sherry Mosley is a wonderful 75 y.o. female who has been referred to [place] by Dr Seth Bake for evaluation and management of Non-Hodgkin's Lymphoma. The pt reports that she is doing well overall.  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 She has recently moved to [place] on 11/07/2018. She has not had the chance to begin care with a PCP.   The pt reports that she had Pancreatitis in 09/2012 which send her to the ER. Pt also notes that she had a rash that began in 08/2012. Her son, who is also a physician, thought that she had passing viral Pancreatitis due to her symptoms. She then had inguinal lymphadenopathy that was incidentally noted but was not causing problems. She was diagnosed with Non-Hodgkin's Lymphoma in 09/2012 after an excisional biopsy of right inguinal lymph node and a PET/CT Scan. They were following her until 06/2018 when they did a repeat CT scan and found enlarging lymph nodes. Pt denies having any symptoms at that time. They then began R-CVP in March due to the pt's insistance but she  began feeling fatigued and was in bed for >50% of the time. She chose to stop treatment after fifth cycle due to her moving and knowing that she would not be able to do so with the severe fatigue. Her last treatment was in June. She does not currently feel any enlarged lymph nodes but she notes that she doesn't examine herself often. Pt also had some peripheral neuropathy with R-CVP which has since resolved. She denies any transfusion needs or infection issues during treatments. Pt did have some insomnia with Prednisone at 100 mg so she was lowered to 60 mg which was much better. She did not have any cytopenias related to her treatment.  Pt is on Lisinopril 18m for her Essential HTN and Simvastatin 210mfor HLD. Pt has continued to stay up to date with her age-appropriate cancer screenings. Her father was diagnosed with stage 4 Prostate Cancer in his 7062'snd he lived for 11 years after diagnosis. There is no other family history of cancer or blood/immunological disorders. Pt has not been on any other immunosuppressive drugs and has never had any rheumatological issues. Pt has never smoked and does not drink outside of social situations.   Most recent lab results (09/12/2018) of CBC is as follows: all values are WNL except for WBC at 4.7K, ANC at 2612, Hgb at 13.4, PLTs at 202K.  On review of systems, pt denies oral thrush, abdominal pain, change in bowel habits, fever, chills, drenching night sweats, skin rashes, fatigue, numbness/tingling in her fingers and any other symptoms.   On PMHx the pt reports HTN, HLD, Pancreatitis (  09/2012). On Social Hx the pt reports she is a non-smoker, doesn't drink outside of social situations. On Family Hx the pt reports a father Dx with Prostate Cancer in his 65's.  INTERVAL HISTORY:   Sherry Mosley is a wonderful 75 y.o. 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 We are joined today by her husband. The patient's last visit with [place]  was on 03/03/2020. The pt reports that she is doing well overall.  The pt reports that she has been experiencing diarrhea for the last month. She tried stopping all dairy products and this made no change. This diarrhea came on suddenly and she goes 6-7x daily. The patient denies any blood or mucus in the stools. She denies any recent travels outside the [place], or any abdominal cramping and pain. She has also been experiencing some pain that wakes her during the night. She denies any acute swelling or differential swelling. She denies any new supplements or medication changes/additions.  Lab results today 09/02/2020 of CBC w/diff and CMP is as follows: all values are WNL except for plt of 131K, total protein of 8.6, Albumin of 3.3, GFR est of 59. 09/02/2020 LDH of 336.  On review of systems, pt reports diarrhea and denies fevers, chills, night sweats, abdominal pain, abdominal cramping, leg swelling, skin rashes, skin lesions, weight loss, changes in breathing, SOB, new lumps/bumps, nausea, vomiting, bowel discomfort, change in urinary habits, and any other symptoms.  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 Son is alternate   Would accept resuscitation   No tube feeds if cognitively unaware   Social Determinants of Health   Financial Resource Strain: Not on file  Food Insecurity: Not on file  Transportation Needs: Not on file  Physical Activity: Not on file  Stress: Not on file  Social Connections: Not on file  Intimate Partner Violence: Not on file    FAMILY HISTORY: Family History  Problem Relation Age of Onset   Alzheimer's disease Mother    Heart failure Father    Prostate cancer Father    Diabetes Maternal Grandfather    Breast cancer Neg Hx     ALLERGIES:  is allergic to cephalosporins and penicillins.  PCN - significant rash  Cephalosporins - significant rash   MEDICATIONS:  Current Outpatient Medications  Medication Sig Dispense Refill   Ascorbic Acid (VITAMIN C) 1000 MG tablet Take 1 tablet by mouth daily.     Boswellia-Glucosamine-Vit D (OSTEO BI-FLEX  ONE PER DAY PO) Take by mouth.     Cholecalciferol (VITAMIN D3) 50 MCG (2000 UT) capsule Take 1 capsule by mouth daily.     citalopram (CELEXA) 10 MG tablet Take 1 tablet by mouth daily.     lisinopril (ZESTRIL) 5 MG tablet Take 5 mg by mouth daily.     Loratadine 10 MG CAPS Take 1 tablet by mouth daily.     simvastatin (ZOCOR) 20 MG tablet Take 1 tablet (20 mg total) by mouth daily. 30 tablet 5   temazepam (RESTORIL) 15 MG capsule TAKE 1 CAPSULE BY MOUTH AT BEDTIME AS NEEDED FOR SLEEP. 30 capsule 2   vitamin E 400 UNIT capsule Take 1 capsule by mouth daily.     No current facility-administered medications for this visit.  Lisinopril 73m -  HTN  Simvastatin 275m- HLD  REVIEW OF SYSTEMS:   10 Point review of Systems was done is negative except as noted above.  PHYSICAL EXAMINATION: ECOG PERFORMANCE STATUS: 1 - Symptomatic but completely ambulatory  Vitals:    09/02/20 1438  BP: 130/73  Pulse: 87  Resp: 18  Temp: 98.1 F (36.7 C)  SpO2: 98%    Filed Weights   09/02/20 1438  Weight: 147 lb 12.8 oz (67 kg)    .Body mass index is 26.18 kg/m.    GENERAL:alert, in no acute distress and comfortable SKIN: no acute rashes, no significant lesions EYES: conjunctiva are pink and non-injected, sclera anicteric OROPHARYNX: MMM, no exudates, no oropharyngeal erythema or ulceration NECK: supple, no JVD LYMPH:  no palpable lymphadenopathy in the axillary region. Palpable lymph node in cervical region, one new not felt on last examination. Slight increase in inguinal region nodule. LUNGS: clear to auscultation b/l with normal respiratory effort HEART: regular rate & rhythm ABDOMEN:  normoactive bowel sounds , non tender, not distended. Extremity: no pedal edema PSYCH: alert & oriented x 3 with fluent speech NEURO: no focal motor/sensory deficits  LABORATORY DATA:  I have reviewed the data as listed  . CBC Latest Ref Rng & Units 09/02/2020 03/03/2020 08/06/2019  WBC 4.0 - 10.5 K/uL 5.8 4.7 4.6  Hemoglobin 12.0 - 15.0 g/dL 13.7 13.3 14.3  Hematocrit 36.0 - 46.0 % 41.1 40.4 43.1  Platelets 150 - 400 K/uL 131(L) 143(L) 166    CMP Latest Ref Rng & Units 09/02/2020 03/03/2020 08/06/2019  Glucose 70 - 99 mg/dL 87 167(H) 86  BUN 8 - 23 mg/dL _0 Creatinine 0.44 - 1.00 mg/dL 0.99 1.05(H) 0.93  Sodium 135 - 145 mmol/L 140 138 139  Potassium 3.5 - 5.1 mmol/L 4.2 4.5 4.2  Chloride 98 - 111 mmol/L 105 104 103  CO2 22 - 32 mmol/L _1 Calcium 8.9 - 10.3 mg/dL 9.2 8.8(L) 9.2  Total Protein 6.5 - 8.1 g/dL 8.6(H) 7.7 7.5  Total Bilirubin 0.3 - 1.2 mg/dL 0.5 0.5 0.6  Alkaline Phos 38 - 126 U/L 59 95 71  AST 15 - 41 U/L 26 27 44(H)  ALT 0 - 44 U/L 18 36 54(H)   SURGICAL PATHOLOGY  CASE: [id number]  PATIENT: Laqueena Hoppe  Surgical Pathology Report   Specimen Submitted:  A. Lymph node, right inguinal   Clinical History: History of low grade B  cell lymphoma, now with right  inguinal lymphadenopathy, post [place] BX   DIAGNOSIS:  A. LYMPH NODE, RIGHT INGUINAL; ULTRASOUND-GUIDED BIOPSY:  - LOW-GRADE B-CELL LYMPHOMA WITH FEATURES MOST SUGGESTIVE OF MARGINAL  ZONE LYMPHOMA.  Comment:  Sections demonstrate small to medium sized lymphocytes with monocytoid  features.  Scattered plasma cells are present. Residual normal lymphoid  architecture is not identified.  Material was submitted for flow cytometric analysis which revealed a  monoclonal kappa B cell population that was negative for CD5, CD23, and  CD10 (see scanned report in [facility]).  A panel of immunohistochemical stains was performed with the following  pattern of immunoreactivity:  CD20: Positive in neoplastic lymphocytes  CD3: Highlights background T cells in a distribution similar to CD5  CD5: Highlights background T cells in a distribution similar to CD3  CD23: Highlights focal residual dendritic meshworks  CD10: Negative in neoplastic lymphocytes  BCL-2: Positive in neoplastic lymphocytes  BCL 6: Negative in neoplastic lymphocytes  Cyclin D1: Negative in neoplastic lymphocytes  Sox 11: Negative in neoplastic lymphocytes  Kappa-ISH: Positive in neoplastic lymphocytes  Lambda-ISH: Negative in neoplastic lymphocytes  Ki-67: Approximately 30%  The histologic features in conjunction with the immunophenotypic  findings support the diagnosis of a low-grade mature B-cell lymphoma  most suggestive of marginal zone lymphoma.   Sox 11 IHC and kappa / lambda ISH slides were prepared by [facility] [employer], [place], [place].  The remaining IHC slides were prepared by [facility], [place], [place]. All controls stained appropriately.   This test was developed and its performance characteristics determined  by [facility]. It has not been cleared or approved by the [place] Food and Drug  Administration. The FDA does not  require this test to go through  premarket FDA review. This test is used for clinical purposes. It should  not be regarded as investigational or for research. This laboratory is  certified under the Clinical Laboratory Improvement Amendments (CLIA) as  qualified to perform high complexity clinical laboratory testing.   GROSS DESCRIPTION:  A. Labeled: Right inguinal lymph node  Received: The specimen is received fresh on saline soaked gauze.  Tissue fragment(s): Multiple  Size: Range from 0.5 to 1.9 cm in length and 0.1 cm in diameter  Description: Received are multiple cores and fragments of tan-white soft  tissue.  1 touch prep with Diff-Quik stain is performed.  Representative  sections are submitted for flow cytometry in RPMI.  The remainder of the  specimen is submitted in cassettes 1-4 with 1 core per cassette.    Final Diagnosis performed by Quay Burow, MD.   Electronically signed  08/31/2019 1:38:09PM  The electronic signature indicates that the named Attending Pathologist  has evaluated the specimen  Technical component performed at [facility], [address] [place],  [place] [address] Lab: [phone] Dir: Rush Farmer, MD, MMM   Professional component performed at [facility], [facility], [place], [place], [place] [address] Lab: [phone]  Dir: Dellia Nims. Reuel Derby, MD   RADIOGRAPHIC STUDIES: I have personally reviewed the radiological images as listed and agreed with the findings in the report. No results found.  ASSESSMENT & PLAN:  75 y.o. with   1) Atleast Stage IIIA Low grade B cell Non Hodgkins Lymphoma -consistent with Marginal zone lymphoma S/p R-CVP x  five cycles from 05/05/18 through 08/18/18.    PLAN: -Discussed pt labwork today, 09/02/2020; plt still borderline low but not concerning, other counts normal, chemistries stable. LDH elevated to the 300s. -Advised pt that increased LDH can be caused by inflammation. -No lab or clinical  evidence of significant low grade Marginal Zone Lymphoma progression at this time. -Will get  stool studies. -Will get repeat scans given increased LDH and symptoms. The pt prefers for this to be done in [place]. -Recommended pt start taking OTC Probiotic (saccharomyces boulardii) BID. -Will send referral to Dr. Lucilla Lame, MD for GI workup in setting of persistent diarrhea for over one month. -Refill Temazepam. -Will see back in 2 weeks via phone.   FOLLOW UP: Labs today PET/CT in1  week Referral to GI Dr Lucilla Lame at [place] Phone visit in 2 weeks   The total time spent in the appt was 25 minutes and more than 50% was on counseling and direct patient cares.  All of the patient's questions were answered with apparent satisfaction. The patient knows to call the clinic with any problems, questions or concerns.   Sullivan Lone MD [place] AAHIVMS [facility] Hematology/Oncology Physician [facility]  (Office):       [phone] (Work cell):  [phone] (Fax):           [phone]  09/02/2020 3:12 PM  I, Reinaldo Raddle, am acting as scribe for Dr. Sullivan Lone, MD.

## 2020-09-02 ENCOUNTER — Inpatient Hospital Stay: Payer: Medicare Other | Admitting: Hematology

## 2020-09-02 ENCOUNTER — Other Ambulatory Visit: Payer: Self-pay

## 2020-09-02 ENCOUNTER — Inpatient Hospital Stay: Payer: Medicare Other | Attending: Hematology

## 2020-09-02 VITALS — BP 130/73 | HR 87 | Temp 98.1°F | Resp 18 | Wt 147.8 lb

## 2020-09-02 DIAGNOSIS — Z79899 Other long term (current) drug therapy: Secondary | ICD-10-CM | POA: Insufficient documentation

## 2020-09-02 DIAGNOSIS — C8580 Other specified types of non-Hodgkin lymphoma, unspecified site: Secondary | ICD-10-CM | POA: Diagnosis not present

## 2020-09-02 DIAGNOSIS — Z8572 Personal history of non-Hodgkin lymphomas: Secondary | ICD-10-CM | POA: Diagnosis not present

## 2020-09-02 DIAGNOSIS — R197 Diarrhea, unspecified: Secondary | ICD-10-CM

## 2020-09-02 DIAGNOSIS — R7402 Elevation of levels of lactic acid dehydrogenase (LDH): Secondary | ICD-10-CM | POA: Insufficient documentation

## 2020-09-02 LAB — CMP (CANCER CENTER ONLY)
ALT: 18 U/L (ref 0–44)
AST: 26 U/L (ref 15–41)
Albumin: 3.3 g/dL — ABNORMAL LOW (ref 3.5–5.0)
Alkaline Phosphatase: 59 U/L (ref 38–126)
Anion gap: 7 (ref 5–15)
BUN: 16 mg/dL (ref 8–23)
CO2: 28 mmol/L (ref 22–32)
Calcium: 9.2 mg/dL (ref 8.9–10.3)
Chloride: 105 mmol/L (ref 98–111)
Creatinine: 0.99 mg/dL (ref 0.44–1.00)
GFR, Estimated: 59 mL/min — ABNORMAL LOW (ref >60)
Glucose, Bld: 87 mg/dL (ref 70–99)
Potassium: 4.2 mmol/L (ref 3.5–5.1)
Sodium: 140 mmol/L (ref 135–145)
Total Bilirubin: 0.5 mg/dL (ref 0.3–1.2)
Total Protein: 8.6 g/dL — ABNORMAL HIGH (ref 6.5–8.1)

## 2020-09-02 LAB — CBC WITH DIFFERENTIAL/PLATELET
Abs Immature Granulocytes: 0.01 10*3/uL (ref 0.00–0.07)
Basophils Absolute: 0 10*3/uL (ref 0.0–0.1)
Basophils Relative: 1 %
Eosinophils Absolute: 0.1 10*3/uL (ref 0.0–0.5)
Eosinophils Relative: 1 %
HCT: 41.1 % (ref 36.0–46.0)
Hemoglobin: 13.7 g/dL (ref 12.0–15.0)
Immature Granulocytes: 0 %
Lymphocytes Relative: 37 %
Lymphs Abs: 2.2 10*3/uL (ref 0.7–4.0)
MCH: 30.6 pg (ref 26.0–34.0)
MCHC: 33.3 g/dL (ref 30.0–36.0)
MCV: 91.7 fL (ref 80.0–100.0)
Monocytes Absolute: 1.1 10*3/uL — ABNORMAL HIGH (ref 0.1–1.0)
Monocytes Relative: 18 %
Neutro Abs: 2.5 10*3/uL (ref 1.7–7.7)
Neutrophils Relative %: 43 %
Platelets: 131 10*3/uL — ABNORMAL LOW (ref 150–400)
RBC: 4.48 MIL/uL (ref 3.87–5.11)
RDW: 13.6 % (ref 11.5–15.5)
WBC: 5.8 10*3/uL (ref 4.0–10.5)
nRBC: 0 % (ref 0.0–0.2)

## 2020-09-02 LAB — LACTATE DEHYDROGENASE: LDH: 336 U/L — ABNORMAL HIGH (ref 98–192)

## 2020-09-02 MED ORDER — TEMAZEPAM 15 MG PO CAPS: ORAL_CAPSULE | ORAL | 5 refills | Status: DC

## 2020-09-03 LAB — GASTROINTESTINAL PANEL BY PCR, STOOL (REPLACES STOOL CULTURE)

## 2020-09-04 ENCOUNTER — Telehealth: Payer: Self-pay | Admitting: Hematology

## 2020-09-04 NOTE — Telephone Encounter (Signed)
Scheduled follow-up appointment per 7/5 los. Patient is aware. 

## 2020-09-11 NOTE — Progress Notes (Signed)
Contacted pt to inform her per Dr. Irene Limbo:  let patient know her GI panel was negative for obvious infectious etiology   Pt verbalized understanding.

## 2020-09-15 ENCOUNTER — Other Ambulatory Visit: Payer: Self-pay

## 2020-09-15 ENCOUNTER — Encounter
Admission: RE | Admit: 2020-09-15 | Discharge: 2020-09-15 | Disposition: A | Discharge status: Home / Self Care | Payer: Medicare Other | Source: Ambulatory Visit | Attending: Hematology | Admitting: Hematology

## 2020-09-15 DIAGNOSIS — C8580 Other specified types of non-Hodgkin lymphoma, unspecified site: Secondary | ICD-10-CM | POA: Insufficient documentation

## 2020-09-15 DIAGNOSIS — Z79899 Other long term (current) drug therapy: Secondary | ICD-10-CM | POA: Diagnosis not present

## 2020-09-15 LAB — GLUCOSE, CAPILLARY: Glucose-Capillary: 92 mg/dL (ref 70–99)

## 2020-09-15 MED ORDER — FLUDEOXYGLUCOSE F - 18 (FDG) INJECTION
7.6000 | Freq: Once | INTRAVENOUS | Status: AC | PRN
  Administered 2020-09-15: 7.926 via INTRAVENOUS

## 2020-09-16 ENCOUNTER — Inpatient Hospital Stay: Payer: Medicare Other | Admitting: Hematology

## 2020-09-26 ENCOUNTER — Telehealth: Payer: Self-pay | Admitting: Hematology

## 2020-09-26 NOTE — Telephone Encounter (Signed)
R/s appt per 7/29 sch msg. Pt aware.

## 2020-09-28 NOTE — Progress Notes (Incomplete)
HEMATOLOGY/ONCOLOGY CONSULTATION NOTE  Date of Service: 09/28/2020  Patient Care Team: Pcp, No as PCP - General  CHIEF COMPLAINTS/PURPOSE OF CONSULTATION:  Non-Hodgkin's Lymphoma  ONCOLOGIC Hx:  B-cell lymphoproliferative disorder, NOS, diagnosed originally by fine needle aspiration of right groin lymph node on 10/25/12  S/p excisional biopsy of right inguinal lymph node on 12/08/12 with finding of B cell lymphoproliferative disorder with indistinct phenotype.  PET and CT scans performed on 12/21/12 consistent with clinical stage disease with adenopathy in both inguinal and femoral areas and left posterior triangle in the neck and probably the left axilla.  Completion of five cycles of CVP-R from 05/05/18 through 08/18/18.   12/08/2012 Surgical pathology of right groin lymph node found: Abnormal Lymphoma FISH result, loss of IGH/14q DNA sequence   HISTORY OF PRESENTING ILLNESS:   Sherry Mosley is a wonderful 75 y.o. female who has been referred to [place] by Dr Seth Bake for evaluation and management of Non-Hodgkin's Lymphoma. The pt reports that she is doing well overall.  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 She has recently moved to [place] on 11/07/2018. She has not had the chance to begin care with a PCP.   The pt reports that she had Pancreatitis in 09/2012 which send her to the ER. Pt also notes that she had a rash that began in 08/2012. Her son, who is also a physician, thought that she had passing viral Pancreatitis due to her symptoms. She then had inguinal lymphadenopathy that was incidentally noted but was not causing problems. She was diagnosed with Non-Hodgkin's Lymphoma in 09/2012 after an excisional biopsy of right inguinal lymph node and a PET/CT Scan. They were following her until 06/2018 when they did a repeat CT scan and found enlarging lymph nodes. Pt denies having any symptoms at that time. They then began R-CVP in March due to the pt's  insistance but she began feeling fatigued and was in bed for >50% of the time. She chose to stop treatment after fifth cycle due to her moving and knowing that she would not be able to do so with the severe fatigue. Her last treatment was in June. She does not currently feel any enlarged lymph nodes but she notes that she doesn't examine herself often. Pt also had some peripheral neuropathy with R-CVP which has since resolved. She denies any transfusion needs or infection issues during treatments. Pt did have some insomnia with Prednisone at 100 mg so she was lowered to 60 mg which was much better. She did not have any cytopenias related to her treatment.  Pt is on Lisinopril 64m for her Essential HTN and Simvastatin 274mfor HLD. Pt has continued to stay up to date with her age-appropriate cancer screenings. Her father was diagnosed with stage 4 Prostate Cancer in his 7032'snd he lived for 11 years after diagnosis. There is no other family history of cancer or blood/immunological disorders. Pt has not been on any other immunosuppressive drugs and has never had any rheumatological issues. Pt has never smoked and does not drink outside of social situations.   Most recent lab results (09/12/2018) of CBC is as follows: all values are WNL except for WBC at 4.7K, ANC at 2612, Hgb at 13.4, PLTs at 202K.  On review of systems, pt denies oral thrush, abdominal pain, change in bowel habits, fever, chills, drenching night sweats, skin rashes, fatigue, numbness/tingling in her fingers and any other symptoms.   On PMHx the pt reports  HTN, HLD, Pancreatitis (09/2012). On Social Hx the pt reports she is a non-smoker, doesn't drink outside of social situations. On Family Hx the pt reports a father Dx with Prostate Cancer in his 28's.  INTERVAL HISTORY:  I connected with Sherry Mosley on 09/29/2020 by telephone and verified that I am speaking with the correct person using two identifiers.   I discussed the  limitations of evaluation and management by telemedicine. The patient expressed understanding and agreed to proceed.   Other persons participating in the visit and their role in the encounter:                                                         - Reinaldo Raddle, Medical Scribe     Patient's location: Home Provider's location: Sherry Mosley at [place] is a wonderful 75 y.o. 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 We are joined today by her husband. The patient's last visit with [place] was on 03/03/2020. The pt reports that she is doing well overall.  The patient's last visit with [place] was on ***. The pt reports that *** is doing well overall.  The pt reports ***  Of note since the patient's last visit, pt has had PET Skull Base to Thigh ([id number]) on 09/15/2020, which revealed "1. Left cervical lymphadenopathy progressive since PET-CT 12/29/2018 (not included on chest abdomen pelvis CT of 08/06/2019) with Deauville 2 activity. 2. Mild retrocrural lymphadenopathy in the chest is new since prior studies from 08/06/2019 and 12/29/2018 with Deauville 2 range activity. 3. Clear progression of abdominopelvic lymphadenopathy since CT of 08/06/2019, demonstrating Deauville 4 level uptake. 4.  Aortic Atherosclerois (ICD10-170.0)"  On review of systems, pt reports *** and denies *** and any other symptoms.    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 Son is alternate   Would accept resuscitation   No tube feeds if cognitively unaware   Social Determinants of Health   Financial Resource Strain: Not on file  Food Insecurity: Not on file  Transportation Needs: Not on file  Physical Activity: Not on file  Stress: Not on file  Social Connections: Not on file  Intimate Partner Violence: Not on file    FAMILY HISTORY: Family History  Problem Relation Age of  Onset   Alzheimer's disease Mother    Heart failure Father    Prostate cancer Father    Diabetes Maternal Grandfather    Breast cancer Neg Hx     ALLERGIES:  is allergic to cephalosporins and penicillins.  PCN - significant rash  Cephalosporins - significant rash   MEDICATIONS:  Current Outpatient Medications  Medication Sig Dispense Refill   Ascorbic Acid (VITAMIN C) 1000 MG tablet Take 1 tablet by mouth daily.     Boswellia-Glucosamine-Vit D (OSTEO BI-FLEX ONE PER DAY PO) Take by mouth.     Cholecalciferol (VITAMIN D3) 50 MCG (2000 UT) capsule Take 1 capsule by mouth daily.     citalopram (CELEXA) 10 MG tablet Take 1 tablet by mouth daily.     lisinopril (ZESTRIL) 5 MG tablet Take 5 mg by mouth daily.     Loratadine 10 MG CAPS Take 1 tablet by mouth daily.     simvastatin (ZOCOR) 20 MG tablet Take 1 tablet (20 mg total) by mouth daily. 30 tablet 5   temazepam (RESTORIL) 15 MG capsule TAKE 1 CAPSULE BY MOUTH AT BEDTIME AS NEEDED FOR SLEEP. 30 capsule 5   vitamin E 400 UNIT capsule Take 1 capsule by mouth daily.     No  current facility-administered medications for this visit.  Lisinopril 82m -  HTN  Simvastatin 217m- HLD  REVIEW OF SYSTEMS:   10 Point review of Systems was done is negative except as noted above.  PHYSICAL EXAMINATION: ECOG PERFORMANCE STATUS: 1 - Symptomatic but completely ambulatory  There were no vitals filed for this visit.   There were no vitals filed for this visit.   .There is no height or weight on file to calculate BMI.    Telehealth Visit.  LABORATORY DATA:  I have reviewed the data as listed  . CBC Latest Ref Rng & Units 09/02/2020 03/03/2020 08/06/2019  WBC 4.0 - 10.5 K/uL 5.8 4.7 4.6  Hemoglobin 12.0 - 15.0 g/dL 13.7 13.3 14.3  Hematocrit 36.0 - 46.0 % 41.1 40.4 43.1  Platelets 150 - 400 K/uL 131(L) 143(L) 166    CMP Latest Ref Rng & Units 09/02/2020 03/03/2020 08/06/2019  Glucose 70 - 99 mg/dL 87 167(H) 86  BUN 8 - 23 mg/dL 16 19 17   Creatinine 0.44 - 1.00 mg/dL 0.99 1.05(H) 0.93  Sodium 135 - 145 mmol/L 140 138 139  Potassium 3.5 - 5.1 mmol/L 4.2 4.5 4.2  Chloride 98 - 111 mmol/L 105 104 103  CO2 22 - 32 mmol/L 28 28 27   Calcium 8.9 - 10.3 mg/dL 9.2 8.8(L) 9.2  Total Protein 6.5 - 8.1 g/dL 8.6(H) 7.7 7.5  Total Bilirubin 0.3 - 1.2 mg/dL 0.5 0.5 0.6  Alkaline Phos 38 - 126 U/L 59 95 71  AST 15 - 41 U/L 26 27 44(H)  ALT 0 - 44 U/L 18 36 54(H)   SURGICAL PATHOLOGY  CASE: [id number]  PATIENT: Shareeka Ainley  Surgical Pathology Report   Specimen Submitted:  A. Lymph node, right inguinal   Clinical History: History of low grade B cell lymphoma, now with right  inguinal lymphadenopathy, post [place] BX   DIAGNOSIS:  A. LYMPH NODE, RIGHT INGUINAL; ULTRASOUND-GUIDED BIOPSY:  - LOW-GRADE B-CELL LYMPHOMA WITH FEATURES MOST SUGGESTIVE OF MARGINAL  ZONE LYMPHOMA.   Comment:  Sections demonstrate small to medium sized lymphocytes with monocytoid  features.  Scattered plasma cells are present. Residual normal lymphoid  architecture is not identified.   Material was submitted for flow  cytometric analysis which revealed a  monoclonal kappa B cell population that was negative for CD5, CD23, and  CD10 (see scanned report in [facility]).  A panel of immunohistochemical stains was performed with the following  pattern of immunoreactivity:  CD20: Positive in neoplastic lymphocytes  CD3: Highlights background T cells in a distribution similar to CD5  CD5: Highlights background T cells in a distribution similar to CD3  CD23: Highlights focal residual dendritic meshworks  CD10: Negative in neoplastic lymphocytes  BCL-2: Positive in neoplastic lymphocytes  BCL 6: Negative in neoplastic lymphocytes  Cyclin D1: Negative in neoplastic lymphocytes  Sox 11: Negative in neoplastic lymphocytes  Kappa-ISH: Positive in neoplastic lymphocytes  Lambda-ISH: Negative in neoplastic lymphocytes  Ki-67: Approximately 30%  The histologic features in conjunction with the immunophenotypic  findings support the diagnosis of a low-grade mature B-cell lymphoma  most suggestive of marginal zone lymphoma.   Sox 11 IHC and kappa / lambda ISH slides were prepared by [place] for [employer], [place], [place].  The remaining IHC slides were prepared by [facility] for Molecular  Biology and Pathology, [place], [address]. All controls stained appropriately.   This test was developed and its performance characteristics determined  by [facility]. It has not been cleared or approved by the [place] Food and Drug  Administration. The FDA does not require this test to go through  premarket FDA review. This test is used for clinical purposes. It should  not be regarded as investigational or for research. This laboratory is  certified under the Clinical Laboratory Improvement Amendments (CLIA) as  qualified to perform high complexity clinical laboratory testing.   GROSS DESCRIPTION:  A. Labeled: Right inguinal lymph node  Received: The specimen is received fresh on  saline soaked gauze.  Tissue fragment(s): Multiple  Size: Range from 0.5 to 1.9 cm in length and 0.1 cm in diameter  Description: Received are multiple cores and fragments of tan-white soft  tissue.  1 touch prep with Diff-Quik stain is performed.  Representative  sections are submitted for flow cytometry in RPMI.  The remainder of the  specimen is submitted in cassettes 1-4 with 1 core per cassette.    Final Diagnosis performed by Quay Burow, MD.   Electronically signed  08/31/2019 1:38:09PM  The electronic signature indicates that the named Attending Pathologist  has evaluated the specimen  Technical component performed at [facility], [address], [place],  [place] [address] Lab: [phone] Dir: Rush Farmer, MD, MMM   Professional component performed at [facility], [facility], [place], [place], [place] [address] Lab: [phone]  Dir: Dellia Nims. Reuel Derby, MD   RADIOGRAPHIC STUDIES: I have personally reviewed the radiological images as listed and agreed with the findings in the report. NM PET Image Restag (PS) Skull Base To Thigh  Result Date: 09/16/2020 CLINICAL DATA:  Subsequent treatment strategy for marginal zone lymphoma. EXAM: NUCLEAR MEDICINE PET SKULL BASE TO THIGH TECHNIQUE: 7.9 mCi F-18 FDG was injected intravenously. Full-ring PET imaging was performed from the skull base to thigh after the radiotracer. CT data was obtained and used for attenuation correction and anatomic localization. Fasting blood glucose: 92 mg/dl COMPARISON:  Chest abdomen pelvis CT 08/06/2019.  PET-CT 12/29/2018 FINDINGS: Mediastinal blood pool activity: SUV max 2.4 Liver activity: SUV max 3.1 NECK: Left posterior triangle index lymph node measured on previous PET-CT at 1.4 cm short axis is 2.1 cm short axis today. SUV max = 2.4. Incidental CT findings: none CHEST: Scattered tiny lymph  nodes are seen in the subpectoral and axillary regions bilaterally. No mediastinal or hilar  lymphadenopathy evident on this noncontrast CT. Retrocrural lymphadenopathy seen on image 120/3 measures up to 11 mm short axis with SUV max = 2.1. These lymph nodes are progressive since 08/06/2019. No suspicious pulmonary nodules on the CT scan. Incidental CT findings: 14 mm nodular opacity identified deep posterior right costophrenic sulcus (122/3) this may be secondary to atelectasis or infection. Mild atherosclerotic calcification is noted in the wall of the thoracic aorta. Tiny hiatal hernia. ABDOMEN/PELVIS: Interval progression of abdominopelvic lymphadenopathy since CT scan of 08/06/2019 with lymphadenopathy in the central mesentery, retroperitoneal space of the abdomen, right pelvic sidewall and groin regions, right greater than left Index 1.8 cm external iliac node measured previously is now 3.4 cm short axis on image 180/3. SUV max = 3.6. Index right external iliac node measured previously at 3.8 cm short axis is now 5.2 cm (196/3) SUV max = 4.1. Nodal conglomeration measured previously in the right groin at 5.3 cm short axis is now 6.8 cm short axis (244/3). SUV max = 3.8. Spleen measures 13.9 x 8.5 x 16.2 cm (1001 cc). FDG accumulation is slightly higher in splenic parenchyma than liver with SUV max = 3.3. Incidental CT findings: Central sinus cysts again noted in both kidneys. There is mild atherosclerotic calcification of the abdominal aorta without aneurysm. Tiny hypodensity inferior right liver is stable, likely a cyst. SKELETON: No focal hypermetabolic activity to suggest skeletal metastasis. Incidental CT findings: none IMPRESSION: 1. Left cervical lymphadenopathy progressive since PET-CT 12/29/2018 (not included on chest abdomen pelvis CT of 08/06/2019) with Deauville 2 activity. 2. Mild retrocrural lymphadenopathy in the chest is new since prior studies from 08/06/2019 and 12/29/2018 with Deauville 2 range activity. 3. Clear progression of abdominopelvic lymphadenopathy since CT of 08/06/2019,  demonstrating Deauville 4 level uptake. 4.  Aortic Atherosclerois (ICD10-170.0) Electronically Signed   By: Misty Stanley M.D.   On: 09/16/2020 06:03    ASSESSMENT & PLAN:  75 y.o. with   1) Atleast Stage IIIA Low grade B cell Non Hodgkins Lymphoma -consistent with Marginal zone lymphoma S/p R-CVP x  five cycles from 05/05/18 through 08/18/18.    PLAN: -Discussed pt labwork, 09/02/2020; *** -Discussed pt PET on 09/15/2020; ***  -No lab or clinical evidence of significant low grade Marginal Zone Lymphoma progression at this time. -Recommended pt start taking OTC Probiotic (saccharomyces boulardii) BID. -Will see back in ***   FOLLOW UP: ***   The total time spent in the appt was *** minutes and more than 50% was on counseling and direct patient cares.  All of the patient's questions were answered with apparent satisfaction. The patient knows to call the clinic with any problems, questions or concerns.   Sullivan Lone MD [place] AAHIVMS [facility] Hematology/Oncology Physician [facility]  (Office):       [phone] (Work cell):  [phone] (Fax):           [phone]  09/28/2020 10:43 PM  I, Reinaldo Raddle, am acting as scribe for Dr. Sullivan Lone, MD.

## 2020-09-29 ENCOUNTER — Inpatient Hospital Stay: Payer: Medicare Other | Attending: Hematology | Admitting: Hematology

## 2020-09-29 ENCOUNTER — Other Ambulatory Visit: Payer: Self-pay

## 2020-10-02 ENCOUNTER — Other Ambulatory Visit: Payer: Self-pay | Admitting: Hematology

## 2020-10-02 DIAGNOSIS — C8580 Other specified types of non-Hodgkin lymphoma, unspecified site: Secondary | ICD-10-CM | POA: Insufficient documentation

## 2020-10-02 DIAGNOSIS — Z7189 Other specified counseling: Secondary | ICD-10-CM

## 2020-10-02 NOTE — Progress Notes (Signed)
START ON PATHWAY REGIMEN - Mosley and CLL     Administer weekly:     Rituximab-xxxx   **Always confirm dose/schedule in your pharmacy ordering system**  Patient Characteristics: Sherry Mosley, Systemic, Second Line Disease Type: Sherry Mosley Disease Type: Not Applicable Disease Type: Not Applicable Localized or Systemic Disease<= Systemic Line of Therapy: Second Line Intent of Therapy: Non-Curative / Palliative Intent, Discussed with Patient

## 2020-10-03 ENCOUNTER — Telehealth: Payer: Self-pay

## 2020-10-03 NOTE — Telephone Encounter (Signed)
Pt contacted per her request. Discussed when she would get scheduled for her infusions and discussed drug and frequency. Pt acknowledged discussion and verbalized understanding. Pt aware scheduling will call her with dates and times of appointments.

## 2020-10-06 ENCOUNTER — Telehealth: Payer: Self-pay | Admitting: *Deleted

## 2020-10-06 NOTE — Telephone Encounter (Signed)
Patient called to inquire about when treatment will start. She expected it to be this week. She has not heard from scheduling. Contacted scheduling - informed request had been sent to [facility]. Patient informed.  Patient states she will also go to [facility] as she lives in [place]. Schedule message sent Advised patient to contact office if she is not contacted by scheduling by Wednesday

## 2020-10-08 ENCOUNTER — Telehealth: Payer: Self-pay | Admitting: Hematology

## 2020-10-08 NOTE — Telephone Encounter (Signed)
I spoke to Sherry Mosley a few minutes ago and she has requested to cancel her appointments here in [place]. She would like to transfer her care to [place] as she lives in [place] and it is closer to her. She also told me she has decided to hold off on any treatment until after September 17th. Staff msg sent to nurse and MD letting them know of pt's decision and to help facilitate transfer to [facility].

## 2020-10-08 NOTE — Telephone Encounter (Signed)
Scheduled appts per 8/5 sch msg. Called pt, no answer. Left msg with appts dates and times.  

## 2020-10-09 ENCOUNTER — Other Ambulatory Visit: Payer: Medicare Other

## 2020-10-10 ENCOUNTER — Other Ambulatory Visit: Payer: Medicare Other

## 2020-10-10 ENCOUNTER — Ambulatory Visit: Payer: Medicare Other | Admitting: Physician Assistant

## 2020-10-13 ENCOUNTER — Ambulatory Visit: Payer: Medicare Other

## 2020-10-14 ENCOUNTER — Other Ambulatory Visit: Payer: Self-pay

## 2020-10-14 ENCOUNTER — Ambulatory Visit: Payer: Medicare Other | Admitting: Nurse Practitioner

## 2020-10-14 ENCOUNTER — Encounter: Payer: Self-pay | Admitting: Nurse Practitioner

## 2020-10-14 VITALS — BP 110/70 | HR 86 | Temp 98.2°F | Ht 63.0 in | Wt 148.0 lb

## 2020-10-14 DIAGNOSIS — E785 Hyperlipidemia, unspecified: Secondary | ICD-10-CM

## 2020-10-14 DIAGNOSIS — C8580 Other specified types of non-Hodgkin lymphoma, unspecified site: Secondary | ICD-10-CM | POA: Diagnosis not present

## 2020-10-14 DIAGNOSIS — F39 Unspecified mood [affective] disorder: Secondary | ICD-10-CM | POA: Diagnosis not present

## 2020-10-14 DIAGNOSIS — I10 Essential (primary) hypertension: Secondary | ICD-10-CM

## 2020-10-14 DIAGNOSIS — R197 Diarrhea, unspecified: Secondary | ICD-10-CM

## 2020-10-14 DIAGNOSIS — G479 Sleep disorder, unspecified: Secondary | ICD-10-CM

## 2020-10-14 NOTE — Progress Notes (Signed)
Careteam: Patient Care Team: Pcp, No as PCP - General  Advanced Directive information Does Patient Have a Medical Advance Directive?: Yes, Type of Advance Directive: [place];Living will;Out of facility DNR (pink MOST or yellow form), Does patient want to make changes to medical advance directive?: No - Patient declined  Allergies  Allergen Reactions   Cephalosporins     Rash   Penicillins     Significant Rash    Chief Complaint  Patient presents with   Establish Care    New patient to establish care. Patient having loose stools. Patient has been having loose stools for a about 2-3 months. Patient would like referral to gastroenterologist.Patient has no abdominal pain.She has not noticed any blood in stool.      HPI: Patient is a 75 y.o. female seen in today at the [facility] and would like to establish care and have a GI referral due to persistent diarrhea. (Referral has been placed by oncologist on 09/08/20 She has stool studies done by oncologist which was negative. Other lab work was stable.   She had been on probiotic but has not restart.  Has increased fiber.  Has no symptoms other than multiple loose stools a day.   She has a pmhx of htn, hyperlipidemia, insomnia, mood disorder band pancreatitis and marginal zone lymphoma.   Review of Systems:  Review of Systems  Constitutional:  Negative for chills, fever and weight loss.  HENT:  Negative for tinnitus.   Respiratory:  Negative for cough, sputum production and shortness of breath.   Cardiovascular:  Negative for chest pain, palpitations and leg swelling.  Gastrointestinal:  Positive for diarrhea. Negative for abdominal pain, constipation and heartburn.  Genitourinary:  Negative for dysuria, frequency and urgency.  Musculoskeletal:  Negative for back pain, falls, joint pain and myalgias.  Skin: Negative.   Neurological:  Negative for dizziness and headaches.  Psychiatric/Behavioral:   Negative for depression and memory loss. The patient does not have insomnia.    Past Medical History:  Diagnosis Date   Generalized osteoarthritis    Hyperlipidemia    Hypertension    Mood disorder ([place])    Non Hodgkin's lymphoma ([place])    Sleep disorder    Past Surgical History:  Procedure Laterality Date   ABDOMINAL HYSTERECTOMY  1997   ANKLE ARTHRODESIS Right 2012   BREAST BIOPSY Left 1969   fibroadenoma   CATARACT EXTRACTION W/ INTRAOCULAR LENS IMPLANT Bilateral    LYMPH NODE BIOPSY  2014   excisional biopsy right groin   Thumb surgery     removal of part of extensor tendon   Social History:   reports that she has never smoked. She has never used smokeless tobacco. She reports current alcohol use. No history on file for drug use.  Family History  Problem Relation Age of Onset   Alzheimer's disease Mother    Heart failure Father    Prostate cancer Father    Diabetes Maternal Grandfather    Breast cancer Neg Hx     Medications: Patient's Medications  New Prescriptions   No medications on file  Previous Medications   ASCORBIC ACID (VITAMIN C) 1000 MG TABLET    Take 1 tablet by mouth daily.   BOSWELLIA-GLUCOSAMINE-VIT D (OSTEO BI-FLEX ONE PER DAY PO)    Take by mouth.   CHOLECALCIFEROL (VITAMIN D3) 50 MCG (2000 UT) CAPSULE    Take 1 capsule by mouth daily.   CITALOPRAM (CELEXA) 10 MG TABLET  Take 1 tablet by mouth daily.   LISINOPRIL (ZESTRIL) 5 MG TABLET    Take 5 mg by mouth daily.   LORATADINE 10 MG CAPS    Take 1 tablet by mouth daily.   SIMVASTATIN (ZOCOR) 20 MG TABLET    Take 1 tablet (20 mg total) by mouth daily.   TEMAZEPAM (RESTORIL) 15 MG CAPSULE    TAKE 1 CAPSULE BY MOUTH AT BEDTIME AS NEEDED FOR SLEEP.   VITAMIN E 400 UNIT CAPSULE    Take 1 capsule by mouth daily.  Modified Medications   No medications on file  Discontinued Medications   No medications on file    Physical Exam:  Vitals:   10/14/20 1044  BP: 110/70  Pulse: 86  Temp: 98.2 F  (36.8 C)  TempSrc: Oral  SpO2: 97%  Weight: 148 lb (67.1 kg)  Height: '5\' 3"'$  (1.6 m)   Body mass index is 26.22 kg/m. Wt Readings from Last 3 Encounters:  10/14/20 148 lb (67.1 kg)  09/02/20 147 lb 12.8 oz (67 kg)  08/05/20 149 lb (67.6 kg)    Physical Exam Constitutional:      General: She is not in acute distress.    Appearance: She is well-developed. She is not diaphoretic.  HENT:     Head: Normocephalic and atraumatic.     Mouth/Throat:     Pharynx: No oropharyngeal exudate.  Eyes:     Conjunctiva/sclera: Conjunctivae normal.     Pupils: Pupils are equal, round, and reactive to light.  Cardiovascular:     Rate and Rhythm: Normal rate and regular rhythm.     Heart sounds: Normal heart sounds.  Pulmonary:     Effort: Pulmonary effort is normal.     Breath sounds: Normal breath sounds.  Abdominal:     General: Bowel sounds are normal.     Palpations: Abdomen is soft.  Musculoskeletal:     Cervical back: Normal range of motion and neck supple.     Right lower leg: No edema.     Left lower leg: No edema.  Skin:    General: Skin is warm and dry.  Neurological:     Mental Status: She is alert and oriented to person, place, and time.  Psychiatric:        Mood and Affect: Mood normal.    Labs reviewed: Basic Metabolic Panel: Recent Labs    03/03/20 1332 09/02/20 1255  NA 138 140  K 4.5 4.2  CL 104 105  CO2 28 28  GLUCOSE 167* 87  BUN 19 16  CREATININE 1.05* 0.99  CALCIUM 8.8* 9.2   Liver Function Tests: Recent Labs    03/03/20 1332 09/02/20 1255  AST 27 26  ALT 36 18  ALKPHOS 95 59  BILITOT 0.5 0.5  PROT 7.7 8.6*  ALBUMIN 3.3* 3.3*   No results for input(s): LIPASE, AMYLASE in the last 8760 hours. No results for input(s): AMMONIA in the last 8760 hours. CBC: Recent Labs    03/03/20 1332 09/02/20 1255  WBC 4.7 5.8  NEUTROABS 3.0 2.5  HGB 13.3 13.7  HCT 40.4 41.1  MCV 93.1 91.7  PLT 143* 131*   Lipid Panel: No results for input(s):  CHOL, HDL, LDLCALC, TRIG, CHOLHDL, LDLDIRECT in the last 8760 hours. TSH: No results for input(s): TSH in the last 8760 hours. A1C: No results found for: HGBA1C   Assessment/Plan 1. Primary hypertension --stable. Goal bp <140/90. Continue on lisinopril 5 mg daily with low sodium diet.  2. Hyperlipidemia, unspecified hyperlipidemia type No recent lipids. Continues on zocor with dietary modifications.   3. Marginal zone lymphoma (HCC) -stable. Followed by oncology  4. Mood disorder ([place]) -mostly anxiety but this has improved. Continues on celexa but feels like she can wean herself.   5. Sleep disorder Well controlled on temazepam 15 mg by mouth qhs  6. Diarrhea, unspecified type -ongoing, referral placed by oncology but she has not heard back from this. Encouraged to call GI office however if she needs new referral she can call our office and we can placed.   Next appt: 6 months. Carlos American. [place], [facility] [phone]

## 2020-10-14 NOTE — Patient Instructions (Signed)
Fasting labs at [facility] on Thursday 10/16/20 be at clinic at 7:30

## 2020-10-17 ENCOUNTER — Telehealth: Payer: Self-pay | Admitting: Nurse Practitioner

## 2020-10-17 LAB — LIPID PANEL
Cholesterol: 173 (ref 0–200)
HDL: 26 — AB (ref 35–70)
LDL Cholesterol: 110
Triglycerides: 242 — AB (ref 40–160)

## 2020-10-17 NOTE — Telephone Encounter (Signed)
Called and discussed results to the patient. Report mailed to patient as requested.

## 2020-10-17 NOTE — Telephone Encounter (Signed)
Please call pt and notify that her lipid panel showed elevated cholesterol.  LDL high at 110, triglycerides at 242 and HDL low at 26. Encourage increase physical activity to improve HDL and dietary modifications to improve triglycerides and LDL.

## 2020-10-23 ENCOUNTER — Other Ambulatory Visit: Payer: Self-pay | Admitting: Hematology

## 2020-10-23 DIAGNOSIS — C8580 Other specified types of non-Hodgkin lymphoma, unspecified site: Secondary | ICD-10-CM

## 2020-10-30 ENCOUNTER — Other Ambulatory Visit: Payer: Self-pay | Admitting: Nurse Practitioner

## 2020-10-30 ENCOUNTER — Telehealth: Payer: Self-pay

## 2020-10-30 DIAGNOSIS — Z1231 Encounter for screening mammogram for malignant neoplasm of breast: Secondary | ICD-10-CM

## 2020-10-30 MED ORDER — LISINOPRIL 5 MG PO TABS: 5.0000 mg | ORAL_TABLET | Freq: Every day | ORAL | 1 refills | Status: DC

## 2020-10-30 MED ORDER — SIMVASTATIN 20 MG PO TABS: 20.0000 mg | ORAL_TABLET | Freq: Every day | ORAL | 1 refills | Status: DC

## 2020-10-30 MED ORDER — CITALOPRAM HYDROBROMIDE 10 MG PO TABS: 10.0000 mg | ORAL_TABLET | Freq: Every day | ORAL | 1 refills | Status: DC

## 2020-10-30 NOTE — Telephone Encounter (Signed)
Patient request refills on medications

## 2020-11-05 ENCOUNTER — Encounter: Payer: Self-pay | Admitting: Oncology

## 2020-11-05 ENCOUNTER — Encounter (INDEPENDENT_AMBULATORY_CARE_PROVIDER_SITE_OTHER): Payer: Self-pay

## 2020-11-05 ENCOUNTER — Inpatient Hospital Stay: Payer: Medicare Other

## 2020-11-05 ENCOUNTER — Inpatient Hospital Stay: Payer: Medicare Other | Attending: Oncology | Admitting: Oncology

## 2020-11-05 VITALS — BP 128/83 | HR 92 | Temp 96.8°F | Resp 17 | Wt 146.0 lb

## 2020-11-05 DIAGNOSIS — C8308 Small cell B-cell lymphoma, lymph nodes of multiple sites: Secondary | ICD-10-CM | POA: Diagnosis not present

## 2020-11-05 DIAGNOSIS — D696 Thrombocytopenia, unspecified: Secondary | ICD-10-CM

## 2020-11-05 DIAGNOSIS — Z9221 Personal history of antineoplastic chemotherapy: Secondary | ICD-10-CM | POA: Insufficient documentation

## 2020-11-05 DIAGNOSIS — F5104 Psychophysiologic insomnia: Secondary | ICD-10-CM | POA: Diagnosis not present

## 2020-11-05 DIAGNOSIS — C8580 Other specified types of non-Hodgkin lymphoma, unspecified site: Secondary | ICD-10-CM | POA: Diagnosis not present

## 2020-11-05 DIAGNOSIS — G4709 Other insomnia: Secondary | ICD-10-CM

## 2020-11-05 DIAGNOSIS — Z5112 Encounter for antineoplastic immunotherapy: Secondary | ICD-10-CM | POA: Diagnosis not present

## 2020-11-05 DIAGNOSIS — G4700 Insomnia, unspecified: Secondary | ICD-10-CM | POA: Insufficient documentation

## 2020-11-05 DIAGNOSIS — R197 Diarrhea, unspecified: Secondary | ICD-10-CM | POA: Insufficient documentation

## 2020-11-05 DIAGNOSIS — Z7189 Other specified counseling: Secondary | ICD-10-CM

## 2020-11-05 DIAGNOSIS — Z79899 Other long term (current) drug therapy: Secondary | ICD-10-CM | POA: Diagnosis not present

## 2020-11-05 DIAGNOSIS — Z8042 Family history of malignant neoplasm of prostate: Secondary | ICD-10-CM | POA: Insufficient documentation

## 2020-11-05 DIAGNOSIS — C8590 Non-Hodgkin lymphoma, unspecified, unspecified site: Secondary | ICD-10-CM | POA: Insufficient documentation

## 2020-11-05 LAB — CBC WITH DIFFERENTIAL/PLATELET
Abs Immature Granulocytes: 0.02 10*3/uL (ref 0.00–0.07)
Basophils Absolute: 0 10*3/uL (ref 0.0–0.1)
Basophils Relative: 0 %
Eosinophils Absolute: 0.1 10*3/uL (ref 0.0–0.5)
Eosinophils Relative: 1 %
HCT: 43.7 % (ref 36.0–46.0)
Hemoglobin: 14.6 g/dL (ref 12.0–15.0)
Immature Granulocytes: 0 %
Lymphocytes Relative: 42 %
Lymphs Abs: 2.9 10*3/uL (ref 0.7–4.0)
MCH: 30.7 pg (ref 26.0–34.0)
MCHC: 33.4 g/dL (ref 30.0–36.0)
MCV: 92 fL (ref 80.0–100.0)
Monocytes Absolute: 1.2 10*3/uL — ABNORMAL HIGH (ref 0.1–1.0)
Monocytes Relative: 17 %
Neutro Abs: 2.8 10*3/uL (ref 1.7–7.7)
Neutrophils Relative %: 40 %
Platelets: 126 10*3/uL — ABNORMAL LOW (ref 150–400)
RBC: 4.75 MIL/uL (ref 3.87–5.11)
RDW: 13.4 % (ref 11.5–15.5)
Smear Review: NORMAL
WBC: 7.1 10*3/uL (ref 4.0–10.5)
nRBC: 0 % (ref 0.0–0.2)

## 2020-11-05 LAB — COMPREHENSIVE METABOLIC PANEL
ALT: 22 U/L (ref 0–44)
AST: 29 U/L (ref 15–41)
Albumin: 3.7 g/dL (ref 3.5–5.0)
Alkaline Phosphatase: 63 U/L (ref 38–126)
Anion gap: 7 (ref 5–15)
BUN: 24 mg/dL — ABNORMAL HIGH (ref 8–23)
CO2: 27 mmol/L (ref 22–32)
Calcium: 9.2 mg/dL (ref 8.9–10.3)
Chloride: 99 mmol/L (ref 98–111)
Creatinine, Ser: 0.85 mg/dL (ref 0.44–1.00)
GFR, Estimated: >60 mL/min (ref >60)
Glucose, Bld: 85 mg/dL (ref 70–99)
Potassium: 4.1 mmol/L (ref 3.5–5.1)
Sodium: 133 mmol/L — ABNORMAL LOW (ref 135–145)
Total Bilirubin: 0.5 mg/dL (ref 0.3–1.2)
Total Protein: 9.9 g/dL — ABNORMAL HIGH (ref 6.5–8.1)

## 2020-11-05 LAB — FOLATE: Folate: 12.7 ng/mL (ref >5.9)

## 2020-11-05 LAB — HEPATITIS PANEL, ACUTE
HCV Ab: NONREACTIVE
Hep A IgM: NONREACTIVE
Hep B C IgM: NONREACTIVE
Hepatitis B Surface Ag: NONREACTIVE

## 2020-11-05 LAB — VITAMIN B12: Vitamin B-12: 461 pg/mL (ref 180–914)

## 2020-11-05 LAB — LACTATE DEHYDROGENASE: LDH: 300 U/L — ABNORMAL HIGH (ref 98–192)

## 2020-11-05 LAB — URIC ACID: Uric Acid, Serum: 6.7 mg/dL (ref 2.5–7.1)

## 2020-11-05 NOTE — Progress Notes (Signed)
Hematology/Oncology Consult note [facility] [phone] [phone]   Patient Care Team: [name], NP as PCP - General (Geriatric Medicine)  REFERRING PROVIDER: Brunetta Genera, MD  CHIEF COMPLAINTS/REASON FOR VISIT:  Evaluation of marginal zone lymphoma.   HISTORY OF PRESENTING ILLNESS:   Sherry Mosley is a  75 y.o.  female with PMH listed below was seen in consultation at the request of  Brunetta Genera, MD  for evaluation of marginal zone lymphoma.   Patient previously follows up with Dr.Kale and transfers her care to [facility].  Previously records review was performed by me.   Patient moved from [place] to [place] 2 years ago. 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   Lymphoma was first diagnosed in 2014, as an incidental finding of inguinal lymphadenopathy. 09/2012,excisional biopsy of right inguinal lymph node showed non hodgkin's lymphoma Observation was recommended. Per Dr.Kale's note, 12/08/2012 Surgical pathology of right groin lymph node found: Abnormal Lymphoma FISH result, loss of IGH/14q DNA sequence  repeat CT showed enlarging lymphadenopathy.  She denies any consitutional symptoms.  05/05/18 through 08/18/18 She finishes chemotherapy R- CVP x 5 [ planned 8 cycles per patient]. She stopped the treatment due to severe fatigue, and also preparing move to [place]. She did not have any cytopenias related to her treatment.  Her father was diagnosed with stage 4 Prostate Cancer in his 56's and he lived for 11 years after diagnosis. There is no other family history of cancer or blood/immunological disorders  She established care with Dr.Kale on 12/12/2018 12/29/2018 PET showed low left cervical and right pelvic hypermetabolic adenopathy,consistent with active lymphoma. ([place]) 4. Aortic Atherosclerosis (ICD10-I70.0). Sinus disease. Possible  constipation Observation was recommended.  08/06/2019 CT chest abdomen pelvis showed 1. Mild increase in abdominal retroperitoneal and right pelvic lymphadenopathy. 2. No evidence of recurrent lymphoma within the chest. At least Stage III  A repeat lymph node biopsy was recommended. 08/27/2019 right inguinal lymph node US guided biopsy showed low grade B cell lymphoma with features most suggestive of marginal zone lymphoma. Ki 67 30% Flowcytometry showed CD5-, CD 10- clonal B cell population.   She was advised possibility of watching vs local ISRT to the bulkier rt inguinal and ext iliac lymphadenopathy. Patient prefers to wait and watch.  09/15/2020 PET scan showed Left cervical lymphadenopathy progressive since PET-CT 12/29/2018 (not included on chest abdomen pelvis CT of 08/06/2019) with [place] 2 activity. Mild retrocrural lymphadenopathy in the chest is new since prior studies from 08/06/2019 and 12/29/2018 with [place] 2 range activity. Clear progression of abdominopelvic lymphadenopathy since CT of 08/06/2019, demonstrating [place] 4 level uptake.  Aortic atherosclerosis.  Patient was advised to start rituximab single agent treatment.  Patient prefers to transfer her care to [facility] which is closer to her home.  Patient has chronic insomnia, patient takes Temazepam for many years.  She started herself on this medication and he tolerates well.  She has tried trazodone previously and not able to tolerate.  Dr. Irene Limbo gave her 66-monthsupply during last visit.  Today patient was accompanied by her husband.  She denies any constitutional symptoms.  Appetite is good. She feels right inguinal adenopathy, the area is not painful  Review of Systems  Constitutional:  Negative for appetite change, chills, fatigue and fever.  HENT:   Negative for hearing loss and voice change.   Eyes:  Negative for eye problems.  Respiratory:  Negative for  chest tightness and cough.   Cardiovascular:  Negative for  chest pain.  Gastrointestinal:  Positive for diarrhea. Negative for abdominal distention, abdominal pain and blood in stool.  Endocrine: Negative for hot flashes.  Genitourinary:  Negative for difficulty urinating and frequency.   Musculoskeletal:  Negative for arthralgias.  Skin:  Negative for itching and rash.  Neurological:  Negative for extremity weakness.  Hematological:  Positive for adenopathy.  Psychiatric/Behavioral:  Negative for confusion.    MEDICAL HISTORY:  Past Medical History:  Diagnosis Date   Generalized osteoarthritis    Hyperlipidemia    Hypertension    Mood disorder ([place])    Non Hodgkin's lymphoma ([place])    Sleep disorder     SURGICAL HISTORY: Past Surgical History:  Procedure Laterality Date   ABDOMINAL HYSTERECTOMY  1997   ANKLE ARTHRODESIS Right 2012   BREAST BIOPSY Left 1969   fibroadenoma   CATARACT EXTRACTION W/ INTRAOCULAR LENS IMPLANT Bilateral    LYMPH NODE BIOPSY  2014   excisional biopsy right groin   Thumb surgery     removal of part of extensor tendon    SOCIAL HISTORY: Social History   Socioeconomic History   Marital status: Married    Spouse name: Not on file   Number of children: 2   Years of education: Not on file   Highest education level: Not on file  Occupational History   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 Son is alternate   Would accept resuscitation   No tube feeds if cognitively unaware   Social Determinants of Health   Financial Resource Strain: Not on  file  Food Insecurity: Not on file  Transportation Needs: Not on file  Physical Activity: Not on file  Stress: Not on file  Social Connections: Not on file  Intimate Partner Violence: Not on file    FAMILY HISTORY: Family History  Problem Relation Age of Onset   Alzheimer's disease Mother    Heart failure Father    Prostate cancer Father    Diabetes Maternal Grandfather    Breast cancer Neg Hx     ALLERGIES:  is allergic to cephalosporins and penicillins.  MEDICATIONS:  Current Outpatient Medications  Medication Sig Dispense Refill   acetaminophen (TYLENOL) 325 MG tablet Take 650 mg by mouth every 6 (six) hours as needed.     Ascorbic Acid (VITAMIN C) 1000 MG tablet Take 1 tablet by mouth daily.     Boswellia-Glucosamine-Vit D (OSTEO BI-FLEX ONE PER DAY PO) Take by mouth.     Cholecalciferol (VITAMIN D3) 50 MCG (2000 UT) capsule Take 1 capsule by mouth daily.     citalopram (CELEXA) 10 MG tablet Take 1 tablet (10 mg total) by mouth daily. 90 tablet 1   lisinopril (ZESTRIL) 5 MG tablet Take 1 tablet (5 mg total) by mouth daily. 90 tablet 1   Loratadine 10 MG CAPS Take 1 tablet by  mouth daily.     simvastatin (ZOCOR) 20 MG tablet Take 1 tablet (20 mg total) by mouth daily. 90 tablet 1   temazepam (RESTORIL) 15 MG capsule TAKE 1 CAPSULE BY MOUTH AT BEDTIME AS NEEDED FOR SLEEP. 30 capsule 5   vitamin E 400 UNIT capsule Take 1 capsule by mouth daily.     No current facility-administered medications for this visit.     PHYSICAL EXAMINATION: ECOG PERFORMANCE STATUS: 0 - Asymptomatic Vitals:   11/05/20 1130  BP: 128/83  Pulse: 92  Resp: 17  Temp: (!) 96.8 F (36 C)  SpO2: 97%   Filed Weights   11/05/20 1130  Weight: 146 lb (66.2 kg)    Physical Exam Constitutional:      General: She is not in acute distress. HENT:     Head: Normocephalic and atraumatic.  Eyes:     General: No scleral icterus. Cardiovascular:     Rate and Rhythm: Normal rate and regular rhythm.      Heart sounds: Normal heart sounds.  Pulmonary:     Effort: Pulmonary effort is normal. No respiratory distress.     Breath sounds: No wheezing.  Abdominal:     General: Bowel sounds are normal. There is no distension.     Palpations: Abdomen is soft.  Musculoskeletal:        General: No deformity. Normal range of motion.     Cervical back: Normal range of motion and neck supple.  Skin:    General: Skin is warm and dry.     Findings: No erythema or rash.  Neurological:     Mental Status: She is alert and oriented to person, place, and time. Mental status is at baseline.     Cranial Nerves: No cranial nerve deficit.     Coordination: Coordination normal.  Psychiatric:        Mood and Affect: Mood normal.    LABORATORY DATA:  I have reviewed the data as listed Lab Results  Component Value Date   WBC 7.1 11/05/2020   HGB 14.6 11/05/2020   HCT 43.7 11/05/2020   MCV 92.0 11/05/2020   PLT 126 (L) 11/05/2020   Recent Labs    03/03/20 1332 09/02/20 1255 11/05/20 1216  NA 138 140 133*  K 4.5 4.2 4.1  CL 104 105 99  CO2 _0 GLUCOSE 167* 87 85  BUN 19 16 24*  CREATININE 1.05* 0.99 0.85  CALCIUM 8.8* 9.2 9.2  GFRNONAA 55* 59* >60  PROT 7.7 8.6* 9.9*  ALBUMIN 3.3* 3.3* 3.7  AST _1 ALT 36 18 22  ALKPHOS 95 59 63  BILITOT 0.5 0.5 0.5   Iron/TIBC/Ferritin/ %Sat No results found for: IRON, TIBC, FERRITIN, IRONPCTSAT    RADIOGRAPHIC STUDIES: I have personally reviewed the radiological images as listed and agreed with the findings in the report. NM PET Image Restag (PS) Skull Base To Thigh  Result Date: 09/16/2020 CLINICAL DATA:  Subsequent treatment strategy for marginal zone lymphoma. EXAM: NUCLEAR MEDICINE PET SKULL BASE TO THIGH TECHNIQUE: 7.9 mCi F-18 FDG was injected intravenously. Full-ring PET imaging was performed from the skull base to thigh after the radiotracer. CT data was obtained and used for attenuation correction and anatomic localization.  Fasting blood glucose: 92 mg/dl COMPARISON:  Chest abdomen pelvis CT 08/06/2019.  PET-CT 12/29/2018 FINDINGS: Mediastinal blood pool activity: SUV max 2.4 Liver activity: SUV max 3.1 NECK: Left posterior triangle index lymph node measured on previous PET-CT at 1.4  cm short axis is 2.1 cm short axis today. SUV max = 2.4. Incidental CT findings: none CHEST: Scattered tiny lymph nodes are seen in the subpectoral and axillary regions bilaterally. No mediastinal or hilar lymphadenopathy evident on this noncontrast CT. Retrocrural lymphadenopathy seen on image 120/3 measures up to 11 mm short axis with SUV max = 2.1. These lymph nodes are progressive since 08/06/2019. No suspicious pulmonary nodules on the CT scan. Incidental CT findings: 14 mm nodular opacity identified deep posterior right costophrenic sulcus (122/3) this may be secondary to atelectasis or infection. Mild atherosclerotic calcification is noted in the wall of the thoracic aorta. Tiny hiatal hernia. ABDOMEN/PELVIS: Interval progression of abdominopelvic lymphadenopathy since CT scan of 08/06/2019 with lymphadenopathy in the central mesentery, retroperitoneal space of the abdomen, right pelvic sidewall and groin regions, right greater than left Index 1.8 cm external iliac node measured previously is now 3.4 cm short axis on image 180/3. SUV max = 3.6. Index right external iliac node measured previously at 3.8 cm short axis is now 5.2 cm (196/3) SUV max = 4.1. Nodal conglomeration measured previously in the right groin at 5.3 cm short axis is now 6.8 cm short axis (244/3). SUV max = 3.8. Spleen measures 13.9 x 8.5 x 16.2 cm (1001 cc). FDG accumulation is slightly higher in splenic parenchyma than liver with SUV max = 3.3. Incidental CT findings: Central sinus cysts again noted in both kidneys. There is mild atherosclerotic calcification of the abdominal aorta without aneurysm. Tiny hypodensity inferior right liver is stable, likely a cyst. SKELETON: No  focal hypermetabolic activity to suggest skeletal metastasis. Incidental CT findings: none IMPRESSION: 1. Left cervical lymphadenopathy progressive since PET-CT 12/29/2018 (not included on chest abdomen pelvis CT of 08/06/2019) with [place] 2 activity. 2. Mild retrocrural lymphadenopathy in the chest is new since prior studies from 08/06/2019 and 12/29/2018 with [place] 2 range activity. 3. Clear progression of abdominopelvic lymphadenopathy since CT of 08/06/2019, demonstrating [place] 4 level uptake. 4.  Aortic Atherosclerois (ICD10-170.0) Electronically Signed   By: Misty Stanley M.D.   On: 09/16/2020 06:03       ASSESSMENT & PLAN:  1. Marginal zone lymphoma ([place])   2. Thrombocytopenia ([place])   3. Other insomnia   4. Goals of care, counseling/discussion   5. Counseling regarding advance care planning and goals of care    #Likely stage III marginal zone lymphoma. I discussed with patient about the indolent disease nature and rationale of starting systemic chemotherapy treatment if patient has constitutional symptoms, organ threatening bulky disease, or cytopenia.  Right inguinal lymph node has become bulkier which potentially may cause leg swelling, pressure and discomfort. I think single agent rituximab weekly x4 is a very reasonable treatment option for low-grade non-Hodgkin's lymphoma. Discussed about the rationale and potential side effects of rituximab.  She agrees with the treatment. Discussed that goals of care is with palliative intent. I will check CBC, CMP, LDH, uric acid, acute hepatitis panel. Chemotherapy education.  Thrombocytopenia, mild.  Check B12 and folate. Patient plans to leave town for a trip to [place] for a week.  Plan to start treatment after she returns.  Insomnia, patient is on temazepam for many years.  Recommend patient to establish care with primary care provider.  She prefers information of local primary care provider offices.  Will provide.  Chronic  diarrhea, patient has appt with GI Dr.Wohl.  GI panel is negative Orders Placed This Encounter  Procedures   CBC with Differential/Platelet    Standing  Status:   Future    Number of Occurrences:   1    Standing Expiration Date:   11/05/2021   Comprehensive metabolic panel    Standing Status:   Future    Number of Occurrences:   1    Standing Expiration Date:   11/05/2021   Flow cytometry panel-leukemia/lymphoma work-up    Standing Status:   Future    Number of Occurrences:   1    Standing Expiration Date:   11/05/2021   Lactate dehydrogenase    Standing Status:   Future    Number of Occurrences:   1    Standing Expiration Date:   11/05/2021   Uric acid    Standing Status:   Future    Number of Occurrences:   1    Standing Expiration Date:   11/05/2021   Vitamin B12    Standing Status:   Future    Number of Occurrences:   1    Standing Expiration Date:   11/05/2021   Folate    Standing Status:   Future    Number of Occurrences:   1    Standing Expiration Date:   11/05/2021   Hepatitis panel, acute    Standing Status:   Future    Number of Occurrences:   1    Standing Expiration Date:   11/05/2021    All questions were answered. The patient knows to call the clinic with any problems questions or concerns.  cc Brunetta Genera, MD    Return of visit:  Thank you for this kind referral and the opportunity to participate in the care of this patient. A copy of today's note is routed to referring provider    Earlie Server, MD, PhD Hematology Oncology [place] [facility]  11/05/2020

## 2020-11-05 NOTE — Progress Notes (Signed)
Patient here for initial oncology appointment, expresses no complaints or concerns at this time.   

## 2020-11-06 ENCOUNTER — Other Ambulatory Visit: Payer: Self-pay

## 2020-11-06 ENCOUNTER — Telehealth: Payer: Self-pay

## 2020-11-06 DIAGNOSIS — C8580 Other specified types of non-Hodgkin lymphoma, unspecified site: Secondary | ICD-10-CM

## 2020-11-06 MED ORDER — TEMAZEPAM 15 MG PO CAPS: ORAL_CAPSULE | ORAL | 1 refills | Status: DC

## 2020-11-06 NOTE — Telephone Encounter (Signed)
Patient would like refill on temazepam, but she states that she needs to talk to Sherrie Mustache, NP directly about this. I informed patient that Janett Billow was still seeing patientsand still requested that receive a phone call. Medication pended and sent to Sherrie Mustache, NP for approval/refusal.

## 2020-11-06 NOTE — Telephone Encounter (Signed)
Patient informed medication was sent to pharmacy. She stated that the only concern she had was having medication called inbefore going out of town.

## 2020-11-06 NOTE — Telephone Encounter (Signed)
Rx sent to pharmacy, please call pt and notify and please have her leave a msg with you in regards to her concerns.

## 2020-11-06 NOTE — Telephone Encounter (Signed)
T/C to patient to discuss New Patient Education Class.  Spoke to patients husband and informed him patient did not have to attend chemo education class tomorrow due to she just received same drug (Rituxan) two years ago along with chemotherapy and patient is only to receive Rituxan at current time.  Informed husband patient can come to class if she would like to but it is not required.  Husband verbalized understanding.

## 2020-11-07 ENCOUNTER — Inpatient Hospital Stay: Payer: Medicare Other

## 2020-11-07 LAB — COMP PANEL: LEUKEMIA/LYMPHOMA: Immunophenotypic Profile: 11

## 2020-11-14 NOTE — Progress Notes (Signed)
Pharmacist Chemotherapy Monitoring - Initial Assessment    Anticipated start date: 11/21/20   The following has been reviewed per standard work regarding the patient's treatment regimen: The patient's diagnosis, treatment plan and drug doses, and organ/hematologic function Lab orders and baseline tests specific to treatment regimen  The treatment plan start date, drug sequencing, and pre-medications Prior authorization status  Patient's documented medication list, including drug-drug interaction screen and prescriptions for anti-emetics and supportive care specific to the treatment regimen The drug concentrations, fluid compatibility, administration routes, and timing of the medications to be used The patient's access for treatment and lifetime cumulative dose history, if applicable  The patient's medication allergies and previous infusion related reactions, if applicable   Changes made to treatment plan:  N/A  Follow up needed:  [place], [place], 11/14/2020  8:52 AM

## 2020-11-21 ENCOUNTER — Inpatient Hospital Stay: Payer: Medicare Other

## 2020-11-21 ENCOUNTER — Inpatient Hospital Stay (HOSPITAL_BASED_OUTPATIENT_CLINIC_OR_DEPARTMENT_OTHER): Payer: Medicare Other | Admitting: Oncology

## 2020-11-21 ENCOUNTER — Encounter: Payer: Self-pay | Admitting: Oncology

## 2020-11-21 VITALS — BP 113/75 | HR 94 | Temp 98.8°F | Resp 18 | Wt 145.9 lb

## 2020-11-21 VITALS — BP 108/72 | HR 92 | Temp 98.0°F | Resp 16

## 2020-11-21 DIAGNOSIS — Z79899 Other long term (current) drug therapy: Secondary | ICD-10-CM | POA: Diagnosis not present

## 2020-11-21 DIAGNOSIS — Z7189 Other specified counseling: Secondary | ICD-10-CM

## 2020-11-21 DIAGNOSIS — C8580 Other specified types of non-Hodgkin lymphoma, unspecified site: Secondary | ICD-10-CM

## 2020-11-21 DIAGNOSIS — D696 Thrombocytopenia, unspecified: Secondary | ICD-10-CM | POA: Diagnosis not present

## 2020-11-21 DIAGNOSIS — Z9221 Personal history of antineoplastic chemotherapy: Secondary | ICD-10-CM | POA: Diagnosis not present

## 2020-11-21 DIAGNOSIS — Z5112 Encounter for antineoplastic immunotherapy: Secondary | ICD-10-CM | POA: Diagnosis not present

## 2020-11-21 DIAGNOSIS — C8308 Small cell B-cell lymphoma, lymph nodes of multiple sites: Secondary | ICD-10-CM | POA: Diagnosis not present

## 2020-11-21 DIAGNOSIS — R197 Diarrhea, unspecified: Secondary | ICD-10-CM | POA: Diagnosis not present

## 2020-11-21 DIAGNOSIS — G4709 Other insomnia: Secondary | ICD-10-CM

## 2020-11-21 LAB — CBC WITH DIFFERENTIAL/PLATELET
Abs Immature Granulocytes: 0.02 10*3/uL (ref 0.00–0.07)
Basophils Absolute: 0 10*3/uL (ref 0.0–0.1)
Basophils Relative: 0 %
Eosinophils Absolute: 0.1 10*3/uL (ref 0.0–0.5)
Eosinophils Relative: 2 %
HCT: 42.5 % (ref 36.0–46.0)
Hemoglobin: 14.3 g/dL (ref 12.0–15.0)
Immature Granulocytes: 0 %
Lymphocytes Relative: 47 %
Lymphs Abs: 2.5 10*3/uL (ref 0.7–4.0)
MCH: 31.1 pg (ref 26.0–34.0)
MCHC: 33.6 g/dL (ref 30.0–36.0)
MCV: 92.4 fL (ref 80.0–100.0)
Monocytes Absolute: 0.6 10*3/uL (ref 0.1–1.0)
Monocytes Relative: 11 %
Neutro Abs: 2.1 10*3/uL (ref 1.7–7.7)
Neutrophils Relative %: 40 %
Platelets: 139 10*3/uL — ABNORMAL LOW (ref 150–400)
RBC: 4.6 MIL/uL (ref 3.87–5.11)
RDW: 13.9 % (ref 11.5–15.5)
Smear Review: NORMAL
WBC: 5.4 10*3/uL (ref 4.0–10.5)
nRBC: 0 % (ref 0.0–0.2)

## 2020-11-21 LAB — COMPREHENSIVE METABOLIC PANEL
ALT: 22 U/L (ref 0–44)
AST: 28 U/L (ref 15–41)
Albumin: 3.5 g/dL (ref 3.5–5.0)
Alkaline Phosphatase: 59 U/L (ref 38–126)
Anion gap: 8 (ref 5–15)
BUN: 22 mg/dL (ref 8–23)
CO2: 27 mmol/L (ref 22–32)
Calcium: 9.1 mg/dL (ref 8.9–10.3)
Chloride: 101 mmol/L (ref 98–111)
Creatinine, Ser: 1.04 mg/dL — ABNORMAL HIGH (ref 0.44–1.00)
GFR, Estimated: 55 mL/min — ABNORMAL LOW (ref >60)
Glucose, Bld: 116 mg/dL — ABNORMAL HIGH (ref 70–99)
Potassium: 4.1 mmol/L (ref 3.5–5.1)
Sodium: 136 mmol/L (ref 135–145)
Total Bilirubin: 0.7 mg/dL (ref 0.3–1.2)
Total Protein: 9 g/dL — ABNORMAL HIGH (ref 6.5–8.1)

## 2020-11-21 MED ORDER — ACETAMINOPHEN 325 MG PO TABS
650.0000 mg | ORAL_TABLET | Freq: Once | ORAL | Status: AC
  Administered 2020-11-21: 650 mg via ORAL
  Filled 2020-11-21: qty 2

## 2020-11-21 MED ORDER — DIPHENHYDRAMINE HCL 25 MG PO CAPS
50.0000 mg | ORAL_CAPSULE | Freq: Once | ORAL | Status: AC
  Administered 2020-11-21: 50 mg via ORAL
  Filled 2020-11-21: qty 2

## 2020-11-21 MED ORDER — METHYLPREDNISOLONE SODIUM SUCC 125 MG IJ SOLR
60.0000 mg | Freq: Every day | INTRAMUSCULAR | Status: DC
  Administered 2020-11-21: 60 mg via INTRAVENOUS
  Filled 2020-11-21: qty 2

## 2020-11-21 MED ORDER — SODIUM CHLORIDE 0.9 % IV SOLN
375.0000 mg/m2 | Freq: Once | INTRAVENOUS | Status: AC
  Administered 2020-11-21: 600 mg via INTRAVENOUS
  Filled 2020-11-21: qty 50

## 2020-11-21 MED ORDER — FAMOTIDINE 20 MG IN NS 100 ML IVPB
20.0000 mg | Freq: Once | INTRAVENOUS | Status: AC
  Administered 2020-11-21: 20 mg via INTRAVENOUS
  Filled 2020-11-21: qty 20

## 2020-11-21 MED ORDER — SODIUM CHLORIDE 0.9 % IV SOLN
Freq: Once | INTRAVENOUS | Status: AC
  Filled 2020-11-21: qty 250

## 2020-11-21 NOTE — Progress Notes (Signed)
Patient here for follow up. Pt reports that she has been having diarrhea to since June, she believes she is lactose intolerant. No other concerns voiced.

## 2020-11-21 NOTE — Progress Notes (Signed)
Hematology/Oncology progress note [facility] [phone] [phone]   Patient Care Team: [name], NP as PCP - General (Geriatric Medicine)  REFERRING PROVIDER: [name], NP  CHIEF COMPLAINTS/REASON FOR VISIT:  marginal zone lymphoma.   HISTORY OF PRESENTING ILLNESS:   Sherry Mosley is a  75 y.o.  female with PMH listed below was seen in consultation at the request of  [name], NP  for evaluation of marginal zone lymphoma.   Patient previously follows up with Dr.Kale and transfers her care to [facility].  Previously records review was performed by me.   Patient moved from [place] to [place] 2 years ago. 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   Lymphoma was first diagnosed in 2014, as an incidental finding of inguinal lymphadenopathy. 09/2012,excisional biopsy of right inguinal lymph node showed non hodgkin's lymphoma Observation was recommended. Per Dr.Kale's note, 12/08/2012 Surgical pathology of right groin lymph node found: Abnormal Lymphoma FISH result, loss of IGH/14q DNA sequence  repeat CT showed enlarging lymphadenopathy.  She denies any consitutional symptoms.  05/05/18 through 08/18/18 She finishes chemotherapy R- CVP x 5 [ planned 8 cycles per patient]. She stopped the treatment due to severe fatigue, and also preparing move to [place]. She did not have any cytopenias related to her treatment.  Her father was diagnosed with stage 4 Prostate Cancer in his 52's and he lived for 11 years after diagnosis. There is no other family history of cancer or blood/immunological disorders  She established care with Dr.Kale on 12/12/2018 12/29/2018 PET showed low left cervical and right pelvic hypermetabolic adenopathy,consistent with active lymphoma. ([place]) 4. Aortic Atherosclerosis (ICD10-I70.0). Sinus disease. Possible constipation Observation was  recommended.  08/06/2019 CT chest abdomen pelvis showed 1. Mild increase in abdominal retroperitoneal and right pelvic lymphadenopathy. 2. No evidence of recurrent lymphoma within the chest. At least Stage III  A repeat lymph node biopsy was recommended. 08/27/2019 right inguinal lymph node US guided biopsy showed low grade B cell lymphoma with features most suggestive of marginal zone lymphoma. Ki 67 30% Flowcytometry showed CD5-, CD 10- clonal B cell population.   She was advised possibility of watching vs local ISRT to the bulkier rt inguinal and ext iliac lymphadenopathy. Patient prefers to wait and watch.  09/15/2020 PET scan showed Left cervical lymphadenopathy progressive since PET-CT 12/29/2018 (not included on chest abdomen pelvis CT of 08/06/2019) with Deauville 2 activity. Mild retrocrural lymphadenopathy in the chest is new since prior studies from 08/06/2019 and 12/29/2018 with Deauville 2 range activity. Clear progression of abdominopelvic lymphadenopathy since CT of 08/06/2019, demonstrating [place]ville 4 level uptake.  Aortic atherosclerosis.  Patient was advised to start rituximab single agent treatment.  Patient prefers to transfer her care to [facility] which is closer to her home.  Patient has chronic insomnia, patient takes Temazepam for many years.  She started herself on this medication and he tolerates well.  She has tried trazodone previously and not able to tolerate.  Dr. Irene Limbo gave her 6-monthsupply during last visit.  # I discussed with patient about the indolent disease nature and rationale of starting systemic chemotherapy treatment if patient has constitutional symptoms, organ threatening bulky disease, or cytopenia.  Right inguinal lymph node has become bulkier which potentially may cause leg swelling, pressure and discomfort. I think single agent rituximab weekly x4 is a very reasonable treatment option for low-grade non-Hodgkin's lymphoma. Rational and potential side effects  were discussed with patient. she agrees with the plan.     INTERVAL HISTORY Sherry Mosley is a 75 y.o. female who has above history reviewed by me today presents for follow up visit for marginal zone lymphoma treatment.  She was accompanied by her husband.  She has no new complaints today.   Review of Systems  Constitutional:  Negative for appetite change, chills, fatigue and fever.  HENT:   Negative for hearing loss and voice change.   Eyes:  Negative for eye problems.  Respiratory:  Negative for chest tightness and cough.   Cardiovascular:  Negative for chest pain.  Gastrointestinal:  Positive for diarrhea. Negative for abdominal distention, abdominal pain and blood in stool.  Endocrine: Negative for hot flashes.  Genitourinary:  Negative for difficulty urinating and frequency.   Musculoskeletal:  Negative for arthralgias.  Skin:  Negative for itching and rash.  Neurological:  Negative for extremity weakness.  Hematological:  Positive for adenopathy.  Psychiatric/Behavioral:  Negative for confusion.    MEDICAL HISTORY:  Past Medical History:  Diagnosis Date   Generalized osteoarthritis    Hyperlipidemia    Hypertension    Mood disorder ([place])    Non Hodgkin's lymphoma ([place])    Sleep disorder     SURGICAL HISTORY: Past Surgical History:  Procedure Laterality Date   ABDOMINAL HYSTERECTOMY  1997   ANKLE ARTHRODESIS Right 2012   BREAST BIOPSY Left 1969   fibroadenoma   CATARACT EXTRACTION W/ INTRAOCULAR LENS IMPLANT Bilateral    LYMPH NODE BIOPSY  2014   excisional biopsy right groin   Thumb surgery     removal of part of extensor tendon    SOCIAL HISTORY: Social History   Socioeconomic History   Marital status: Married    Spouse name: Not on file   Number of children: 2   Years of education: Not on file   Highest education level: Not on file  Occupational History   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 Son is alternate   Would accept resuscitation   No tube feeds if cognitively unaware   Social Determinants of Health   Financial Resource Strain: Not on file  Food Insecurity: Not on file  Transportation Needs: Not on file  Physical Activity: Not on file  Stress: Not on file  Social Connections: Not on file  Intimate Partner Violence: Not on file    FAMILY HISTORY: Family History  Problem Relation Age of Onset   Alzheimer's disease Mother    Heart failure Father    Prostate cancer Father    Diabetes Maternal Grandfather    Breast cancer Neg Hx     ALLERGIES:  is allergic to cephalosporins and penicillins.  MEDICATIONS:  Current Outpatient Medications  Medication Sig Dispense Refill   acetaminophen (TYLENOL) 325 MG tablet Take 650 mg by mouth every 6 (six) hours as needed.     Ascorbic Acid (VITAMIN  C) 1000 MG tablet Take 1 tablet by mouth daily.     Boswellia-Glucosamine-Vit D (OSTEO BI-FLEX ONE PER DAY PO) Take by mouth.     Cholecalciferol (VITAMIN D3) 50 MCG (2000 UT) capsule Take 1 capsule by mouth daily.     citalopram (CELEXA) 10 MG tablet Take 1 tablet (10 mg total) by mouth daily. 90 tablet 1   lisinopril (ZESTRIL) 5 MG tablet Take 1 tablet (5 mg total) by mouth daily. 90 tablet 1   Loratadine 10 MG CAPS Take 1 tablet by mouth daily.     simvastatin (ZOCOR) 20 MG tablet Take 1 tablet (20 mg total) by mouth daily. 90 tablet 1   temazepam (RESTORIL) 15 MG capsule TAKE 1 CAPSULE BY MOUTH AT BEDTIME AS NEEDED FOR SLEEP. 30 capsule 1   vitamin E 400 UNIT capsule  Take 1 capsule by mouth daily.     No current facility-administered medications for this visit.     PHYSICAL EXAMINATION: ECOG PERFORMANCE STATUS: 0 - Asymptomatic Vitals:   11/21/20 0835  BP: 113/75  Pulse: 94  Resp: 18  Temp: 98.8 F (37.1 C)   Filed Weights   11/21/20 0835  Weight: 145 lb 14.4 oz (66.2 kg)    Physical Exam Constitutional:      General: She is not in acute distress. HENT:     Head: Normocephalic and atraumatic.  Eyes:     General: No scleral icterus. Cardiovascular:     Rate and Rhythm: Normal rate and regular rhythm.     Heart sounds: Normal heart sounds.  Pulmonary:     Effort: Pulmonary effort is normal. No respiratory distress.     Breath sounds: No wheezing.  Abdominal:     General: Bowel sounds are normal. There is no distension.     Palpations: Abdomen is soft.  Musculoskeletal:        General: No deformity. Normal range of motion.     Cervical back: Normal range of motion and neck supple.  Skin:    General: Skin is warm and dry.     Findings: No erythema or rash.  Neurological:     Mental Status: She is alert and oriented to person, place, and time. Mental status is at baseline.     Cranial Nerves: No cranial nerve deficit.     Coordination: Coordination normal.  Psychiatric:        Mood and Affect: Mood normal.   Right inguinal lymphadenopathy  LABORATORY DATA:  I have reviewed the data as listed Lab Results  Component Value Date   WBC 7.1 11/05/2020   HGB 14.6 11/05/2020   HCT 43.7 11/05/2020   MCV 92.0 11/05/2020   PLT 126 (L) 11/05/2020   Recent Labs    03/03/20 1332 09/02/20 1255 11/05/20 1216  NA 138 140 133*  K 4.5 4.2 4.1  CL 104 105 99  CO2 $Re'28 28 27  'Jvy$ GLUCOSE 167* 87 85  BUN 19 16 24*  CREATININE 1.05* 0.99 0.85  CALCIUM 8.8* 9.2 9.2  GFRNONAA 55* 59* >60  PROT 7.7 8.6* 9.9*  ALBUMIN 3.3* 3.3* 3.7  AST $Re'27 26 29  'CmF$ ALT 36 18 22  ALKPHOS 95 59 63  BILITOT 0.5 0.5 0.5    Iron/TIBC/Ferritin/ %Sat No  results found for: IRON, TIBC, FERRITIN, IRONPCTSAT    RADIOGRAPHIC STUDIES: I have personally reviewed the radiological images as listed and agreed with the findings in the report. NM PET Image Restag (PS) Skull Base To Thigh  Result  Date: 09/16/2020 CLINICAL DATA:  Subsequent treatment strategy for marginal zone lymphoma. EXAM: NUCLEAR MEDICINE PET SKULL BASE TO THIGH TECHNIQUE: 7.9 mCi F-18 FDG was injected intravenously. Full-ring PET imaging was performed from the skull base to thigh after the radiotracer. CT data was obtained and used for attenuation correction and anatomic localization. Fasting blood glucose: 92 mg/dl COMPARISON:  Chest abdomen pelvis CT 08/06/2019.  PET-CT 12/29/2018 FINDINGS: Mediastinal blood pool activity: SUV max 2.4 Liver activity: SUV max 3.1 NECK: Left posterior triangle index lymph node measured on previous PET-CT at 1.4 cm short axis is 2.1 cm short axis today. SUV max = 2.4. Incidental CT findings: none CHEST: Scattered tiny lymph nodes are seen in the subpectoral and axillary regions bilaterally. No mediastinal or hilar lymphadenopathy evident on this noncontrast CT. Retrocrural lymphadenopathy seen on image 120/3 measures up to 11 mm short axis with SUV max = 2.1. These lymph nodes are progressive since 08/06/2019. No suspicious pulmonary nodules on the CT scan. Incidental CT findings: 14 mm nodular opacity identified deep posterior right costophrenic sulcus (122/3) this may be secondary to atelectasis or infection. Mild atherosclerotic calcification is noted in the wall of the thoracic aorta. Tiny hiatal hernia. ABDOMEN/PELVIS: Interval progression of abdominopelvic lymphadenopathy since CT scan of 08/06/2019 with lymphadenopathy in the central mesentery, retroperitoneal space of the abdomen, right pelvic sidewall and groin regions, right greater than left Index 1.8 cm external iliac node measured previously is now 3.4 cm short axis on image 180/3. SUV max = 3.6. Index  right external iliac node measured previously at 3.8 cm short axis is now 5.2 cm (196/3) SUV max = 4.1. Nodal conglomeration measured previously in the right groin at 5.3 cm short axis is now 6.8 cm short axis (244/3). SUV max = 3.8. Spleen measures 13.9 x 8.5 x 16.2 cm (1001 cc). FDG accumulation is slightly higher in splenic parenchyma than liver with SUV max = 3.3. Incidental CT findings: Central sinus cysts again noted in both kidneys. There is mild atherosclerotic calcification of the abdominal aorta without aneurysm. Tiny hypodensity inferior right liver is stable, likely a cyst. SKELETON: No focal hypermetabolic activity to suggest skeletal metastasis. Incidental CT findings: none IMPRESSION: 1. Left cervical lymphadenopathy progressive since PET-CT 12/29/2018 (not included on chest abdomen pelvis CT of 08/06/2019) with Deauville 2 activity. 2. Mild retrocrural lymphadenopathy in the chest is new since prior studies from 08/06/2019 and 12/29/2018 with De[place]ville 2 range activity. 3. Clear progression of abdominopelvic lymphadenopathy since CT of 08/06/2019, demonstrating De[place]ville 4 level uptake. 4.  Aortic Atherosclerois (ICD10-170.0) Electronically Signed   By: Misty Stanley M.D.   On: 09/16/2020 06:03       ASSESSMENT & PLAN:  1. Marginal zone lymphoma ([place])   2. Thrombocytopenia ([facility])    #Likely stage III marginal zone lymphoma. Labs are reviewed and discussed with patient. 11/21/2020 proceed with cycle 1 Rituximab.  Patient is concerned about the dose of steroid used in premedication.  She has received Rituximab before and previous steroid use had worsened her insomnia symptoms.  decrease solumedrol to $RemoveBefor'60mg'SXeEqOeMmAxH$ .    Thrombocytopenia, mild and chronic.  Normal b12 and folate.   Insomnia, patient is on temazepam for many years.  Recommend patient to establish care with primary care provider.  She prefers information of local primary care provider offices.  Will provide.  Chronic diarrhea,  patient has appt with GI Dr.Wohl.  GI panel is negative    All questions were answered. The patient knows to call the clinic  with any problems questions or concerns.  cc [name], NP    Return of visit:  1 week lab Rituximab 2 weeks lab MD Rituximab  Thank you for this kind referral and the opportunity to participate in the care of this patient. A copy of today's note is routed to referring provider    Earlie Server, MD, PhD 11/21/2020

## 2020-11-21 NOTE — Patient Instructions (Signed)
[facility]  Discharge Instructions: Thank you for choosing [facility] to provide your oncology and hematology care.  If you have a lab appointment with the [facility], please go directly to the [facility] and check in at the registration area.  Wear comfortable clothing and clothing appropriate for easy access to any Portacath or PICC line.   We strive to give you quality time with your provider. You may need to reschedule your appointment if you arrive late (15 or more minutes).  Arriving late affects you and other patients whose appointments are after yours.  Also, if you miss three or more appointments without notifying the office, you may be dismissed from the clinic at the provider's discretion.      For prescription refill requests, have your pharmacy contact our office and allow 72 hours for refills to be completed.    Today you received the following chemotherapy and/or immunotherapy agents : Ruxience   To help prevent nausea and vomiting after your treatment, we encourage you to take your nausea medication as directed.  BELOW ARE SYMPTOMS THAT SHOULD BE REPORTED IMMEDIATELY: *FEVER GREATER THAN 100.4 F (38 C) OR HIGHER *CHILLS OR SWEATING *NAUSEA AND VOMITING THAT IS NOT CONTROLLED WITH YOUR NAUSEA MEDICATION *UNUSUAL SHORTNESS OF BREATH *UNUSUAL BRUISING OR BLEEDING *URINARY PROBLEMS (pain or burning when urinating, or frequent urination) *BOWEL PROBLEMS (unusual diarrhea, constipation, pain near the anus) TENDERNESS IN MOUTH AND THROAT WITH OR WITHOUT PRESENCE OF ULCERS (sore throat, sores in mouth, or a toothache) UNUSUAL RASH, SWELLING OR PAIN  UNUSUAL VAGINAL DISCHARGE OR ITCHING   Items with * indicate a potential emergency and should be followed up as soon as possible or go to the Emergency Department if any problems should occur.  Please show the CHEMOTHERAPY ALERT CARD or IMMUNOTHERAPY ALERT CARD at check-in to  the Emergency Department and triage nurse.  Should you have questions after your visit or need to cancel or reschedule your appointment, please contact [facility]  [phone] and follow the prompts.  Office hours are 8:00 a.m. to 4:30 p.m. Monday - Friday. Please note that voicemails left after 4:00 p.m. may not be returned until the following business day.  We are closed weekends and major holidays. You have access to a nurse at all times for urgent questions. Please call the main number to the clinic [phone] and follow the prompts.  For any non-urgent questions, you may also contact your provider using MyChart. We now offer e-Visits for anyone 51 and older to request care online for non-urgent symptoms. For details visit [email or web address].   Also download the MyChart app! Go to the app store, search "MyChart", open the app, select [facility], and log in with your MyChart username and password.  Due to Covid, a mask is required upon entering the hospital/clinic. If you do not have a mask, one will be given to you upon arrival. For doctor visits, patients may have 1 support person aged 63 or older with them. For treatment visits, patients cannot have anyone with them due to current Covid guidelines and our immunocompromised population.

## 2020-11-28 ENCOUNTER — Inpatient Hospital Stay: Payer: Medicare Other

## 2020-11-28 VITALS — BP 122/75 | HR 84 | Temp 97.0°F | Resp 20 | Wt 144.4 lb

## 2020-11-28 DIAGNOSIS — D696 Thrombocytopenia, unspecified: Secondary | ICD-10-CM | POA: Diagnosis not present

## 2020-11-28 DIAGNOSIS — G4709 Other insomnia: Secondary | ICD-10-CM

## 2020-11-28 DIAGNOSIS — R197 Diarrhea, unspecified: Secondary | ICD-10-CM | POA: Diagnosis not present

## 2020-11-28 DIAGNOSIS — Z7189 Other specified counseling: Secondary | ICD-10-CM

## 2020-11-28 DIAGNOSIS — C8580 Other specified types of non-Hodgkin lymphoma, unspecified site: Secondary | ICD-10-CM

## 2020-11-28 DIAGNOSIS — C8308 Small cell B-cell lymphoma, lymph nodes of multiple sites: Secondary | ICD-10-CM | POA: Diagnosis not present

## 2020-11-28 DIAGNOSIS — Z79899 Other long term (current) drug therapy: Secondary | ICD-10-CM | POA: Diagnosis not present

## 2020-11-28 DIAGNOSIS — Z5112 Encounter for antineoplastic immunotherapy: Secondary | ICD-10-CM | POA: Diagnosis not present

## 2020-11-28 DIAGNOSIS — Z9221 Personal history of antineoplastic chemotherapy: Secondary | ICD-10-CM | POA: Diagnosis not present

## 2020-11-28 LAB — CBC WITH DIFFERENTIAL/PLATELET
Abs Immature Granulocytes: 0.02 10*3/uL (ref 0.00–0.07)
Basophils Absolute: 0 10*3/uL (ref 0.0–0.1)
Basophils Relative: 0 %
Eosinophils Absolute: 0.1 10*3/uL (ref 0.0–0.5)
Eosinophils Relative: 1 %
HCT: 41.8 % (ref 36.0–46.0)
Hemoglobin: 13.9 g/dL (ref 12.0–15.0)
Immature Granulocytes: 0 %
Lymphocytes Relative: 63 %
Lymphs Abs: 5.3 10*3/uL — ABNORMAL HIGH (ref 0.7–4.0)
MCH: 30.3 pg (ref 26.0–34.0)
MCHC: 33.3 g/dL (ref 30.0–36.0)
MCV: 91.3 fL (ref 80.0–100.0)
Monocytes Absolute: 0.4 10*3/uL (ref 0.1–1.0)
Monocytes Relative: 5 %
Neutro Abs: 2.7 10*3/uL (ref 1.7–7.7)
Neutrophils Relative %: 31 %
Platelets: 129 10*3/uL — ABNORMAL LOW (ref 150–400)
RBC: 4.58 MIL/uL (ref 3.87–5.11)
RDW: 13.8 % (ref 11.5–15.5)
Smear Review: NORMAL
WBC: 8.5 10*3/uL (ref 4.0–10.5)
nRBC: 0 % (ref 0.0–0.2)

## 2020-11-28 LAB — COMPREHENSIVE METABOLIC PANEL
ALT: 42 U/L (ref 0–44)
AST: 39 U/L (ref 15–41)
Albumin: 3.3 g/dL — ABNORMAL LOW (ref 3.5–5.0)
Alkaline Phosphatase: 93 U/L (ref 38–126)
Anion gap: 9 (ref 5–15)
BUN: 23 mg/dL (ref 8–23)
CO2: 27 mmol/L (ref 22–32)
Calcium: 9.1 mg/dL (ref 8.9–10.3)
Chloride: 96 mmol/L — ABNORMAL LOW (ref 98–111)
Creatinine, Ser: 0.99 mg/dL (ref 0.44–1.00)
GFR, Estimated: 59 mL/min — ABNORMAL LOW (ref >60)
Glucose, Bld: 154 mg/dL — ABNORMAL HIGH (ref 70–99)
Potassium: 4.2 mmol/L (ref 3.5–5.1)
Sodium: 132 mmol/L — ABNORMAL LOW (ref 135–145)
Total Bilirubin: 0.9 mg/dL (ref 0.3–1.2)
Total Protein: 8.5 g/dL — ABNORMAL HIGH (ref 6.5–8.1)

## 2020-11-28 MED ORDER — DIPHENHYDRAMINE HCL 25 MG PO CAPS
50.0000 mg | ORAL_CAPSULE | Freq: Once | ORAL | Status: AC
  Administered 2020-11-28: 50 mg via ORAL
  Filled 2020-11-28: qty 2

## 2020-11-28 MED ORDER — SODIUM CHLORIDE 0.9 % IV SOLN
Freq: Once | INTRAVENOUS | Status: AC
  Filled 2020-11-28: qty 250

## 2020-11-28 MED ORDER — METHYLPREDNISOLONE SODIUM SUCC 125 MG IJ SOLR
60.0000 mg | Freq: Every day | INTRAMUSCULAR | Status: DC
  Administered 2020-11-28: 60 mg via INTRAVENOUS
  Filled 2020-11-28: qty 2

## 2020-11-28 MED ORDER — FAMOTIDINE 20 MG IN NS 100 ML IVPB
20.0000 mg | Freq: Once | INTRAVENOUS | Status: AC
  Administered 2020-11-28: 20 mg via INTRAVENOUS
  Filled 2020-11-28: qty 20

## 2020-11-28 MED ORDER — SODIUM CHLORIDE 0.9 % IV SOLN
375.0000 mg/m2 | Freq: Once | INTRAVENOUS | Status: AC
  Administered 2020-11-28: 600 mg via INTRAVENOUS
  Filled 2020-11-28: qty 50

## 2020-11-28 MED ORDER — ACETAMINOPHEN 325 MG PO TABS
650.0000 mg | ORAL_TABLET | Freq: Once | ORAL | Status: AC
  Administered 2020-11-28: 650 mg via ORAL
  Filled 2020-11-28: qty 2

## 2020-11-28 NOTE — Patient Instructions (Signed)
[facility]  Discharge Instructions: Thank you for choosing [facility] to provide your oncology and hematology care.  If you have a lab appointment with the [facility], please go directly to the [facility] and check in at the registration area.  Wear comfortable clothing and clothing appropriate for easy access to any Portacath or PICC line.   We strive to give you quality time with your provider. You may need to reschedule your appointment if you arrive late (15 or more minutes).  Arriving late affects you and other patients whose appointments are after yours.  Also, if you miss three or more appointments without notifying the office, you may be dismissed from the clinic at the provider's discretion.      For prescription refill requests, have your pharmacy contact our office and allow 72 hours for refills to be completed.    Today you received the following chemotherapy and/or immunotherapy agents: Ruxience      To help prevent nausea and vomiting after your treatment, we encourage you to take your nausea medication as directed.  BELOW ARE SYMPTOMS THAT SHOULD BE REPORTED IMMEDIATELY: *FEVER GREATER THAN 100.4 F (38 C) OR HIGHER *CHILLS OR SWEATING *NAUSEA AND VOMITING THAT IS NOT CONTROLLED WITH YOUR NAUSEA MEDICATION *UNUSUAL SHORTNESS OF BREATH *UNUSUAL BRUISING OR BLEEDING *URINARY PROBLEMS (pain or burning when urinating, or frequent urination) *BOWEL PROBLEMS (unusual diarrhea, constipation, pain near the anus) TENDERNESS IN MOUTH AND THROAT WITH OR WITHOUT PRESENCE OF ULCERS (sore throat, sores in mouth, or a toothache) UNUSUAL RASH, SWELLING OR PAIN  UNUSUAL VAGINAL DISCHARGE OR ITCHING   Items with * indicate a potential emergency and should be followed up as soon as possible or go to the Emergency Department if any problems should occur.  Please show the CHEMOTHERAPY ALERT CARD or IMMUNOTHERAPY ALERT CARD at check-in  to the Emergency Department and triage nurse.  Should you have questions after your visit or need to cancel or reschedule your appointment, please contact [facility]  [phone] and follow the prompts.  Office hours are 8:00 a.m. to 4:30 p.m. Monday - Friday. Please note that voicemails left after 4:00 p.m. may not be returned until the following business day.  We are closed weekends and major holidays. You have access to a nurse at all times for urgent questions. Please call the main number to the clinic [phone] and follow the prompts.  For any non-urgent questions, you may also contact your provider using MyChart. We now offer e-Visits for anyone 50 and older to request care online for non-urgent symptoms. For details visit [email or web address].   Also download the MyChart app! Go to the app store, search "MyChart", open the app, select [facility], and log in with your MyChart username and password.  Due to Covid, a mask is required upon entering the hospital/clinic. If you do not have a mask, one will be given to you upon arrival. For doctor visits, patients may have 1 support person aged 84 or older with them. For treatment visits, patients cannot have anyone with them due to current Covid guidelines and our immunocompromised population. Rituximab Injection What is this medication? RITUXIMAB (ri TUX i mab) is a monoclonal antibody. It is used to treat certain types of cancer like non-Hodgkin lymphoma and chronic lymphocytic leukemia. It is also used to treat rheumatoid arthritis, granulomatosis with polyangiitis, microscopic polyangiitis, and pemphigus vulgaris. This medicine may be used for other purposes; ask your  health care provider or pharmacist if you have questions. COMMON BRAND NAME(S): RIABNI, Rituxan, RUXIENCE What should I tell my care team before I take this medication? They need to know if you have any of these conditions: chest  pain heart disease infection especially a viral infection such as chickenpox, cold sores, hepatitis B, or herpes immune system problems irregular heartbeat or rhythm kidney disease low blood counts (white cells, platelets, or red cells) lung disease recent or upcoming vaccine an unusual or allergic reaction to rituximab, other medicines, foods, dyes, or preservatives pregnant or trying to get pregnant breast-feeding How should I use this medication? This medicine is injected into a vein. It is given by a health care provider in a hospital or clinic setting. A special MedGuide will be given to you before each treatment. Be sure to read this information carefully each time. Talk to your health care provider about the use of this medicine in children. While this drug may be prescribed for children as young as 6 months for selected conditions, precautions do apply. Overdosage: If you think you have taken too much of this medicine contact a poison control center or emergency room at once. NOTE: This medicine is only for you. Do not share this medicine with others. What if I miss a dose? Keep appointments for follow-up doses. It is important not to miss your dose. Call your health care provider if you are unable to keep an appointment. What may interact with this medication? Do not take this medicine with any of the following medicines: live vaccines This medicine may also interact with the following medicines: cisplatin This list may not describe all possible interactions. Give your health care provider a list of all the medicines, herbs, non-prescription drugs, or dietary supplements you use. Also tell them if you smoke, drink alcohol, or use illegal drugs. Some items may interact with your medicine. What should I watch for while using this medication? Your condition will be monitored carefully while you are receiving this medicine. You may need blood work done while you are taking this  medicine. This medicine can cause serious infusion reactions. To reduce the risk your health care provider may give you other medicines to take before receiving this one. Be sure to follow the directions from your health care provider. This medicine may increase your risk of getting an infection. Call your health care provider for advice if you get a fever, chills, sore throat, or other symptoms of a cold or flu. Do not treat yourself. Try to avoid being around people who are sick. Call your health care provider if you are around anyone with measles, chickenpox, or if you develop sores or blisters that do not heal properly. Avoid taking medicines that contain aspirin, acetaminophen, ibuprofen, naproxen, or ketoprofen unless instructed by your health care provider. These medicines may hide a fever. This medicine may cause serious skin reactions. They can happen weeks to months after starting the medicine. Contact your health care provider right away if you notice fevers or flu-like symptoms with a rash. The rash may be red or purple and then turn into blisters or peeling of the skin. Or, you might notice a red rash with swelling of the face, lips or lymph nodes in your neck or under your arms. In some patients, this medicine may cause a serious brain infection that may cause death. If you have any problems seeing, thinking, speaking, walking, or standing, tell your healthcare professional right away. If you cannot reach  your healthcare professional, urgently seek other source of medical care. Do not become pregnant while taking this medicine or for at least 12 months after stopping it. Women should inform their health care provider if they wish to become pregnant or think they might be pregnant. There is potential for serious harm to an unborn child. Talk to your health care provider for more information. Women should use a reliable form of birth control while taking this medicine and for 12 months after  stopping it. Do not breast-feed while taking this medicine or for at least 6 months after stopping it. What side effects may I notice from receiving this medication? Side effects that you should report to your health care provider as soon as possible: allergic reactions (skin rash, itching or hives; swelling of the face, lips, or tongue) diarrhea edema (sudden weight gain; swelling of the ankles, feet, hands or other unusual swelling; trouble breathing) fast, irregular heartbeat heart attack (trouble breathing; pain or tightness in the chest, neck, back or arms; unusually weak or tired) infection (fever, chills, cough, sore throat, pain or trouble passing urine) kidney injury (trouble passing urine or change in the amount of urine) liver injury (dark yellow or brown urine; general ill feeling or flu-like symptoms; loss of appetite, right upper belly pain; unusually weak or tired, yellowing of the eyes or skin) low blood pressure (dizziness; feeling faint or lightheaded, falls; unusually weak or tired) low red blood cell counts (trouble breathing; feeling faint; lightheaded, falls; unusually weak or tired) mouth sores redness, blistering, peeling, or loosening of the skin, including inside the mouth stomach pain unusual bruising or bleeding wheezing (trouble breathing with loud or whistling sounds) vomiting Side effects that usually do not require medical attention (report to your health care provider if they continue or are bothersome): headache joint pain muscle cramps, pain nausea This list may not describe all possible side effects. Call your doctor for medical advice about side effects. You may report side effects to FDA at 1-800-FDA-1088. Where should I keep my medication? This medicine is given in a hospital or clinic. It will not be stored at home. NOTE: This sheet is a summary. It may not cover all possible information. If you have questions about this medicine, talk to your  doctor, pharmacist, or health care provider.  2022 [employer] (2020-02-07 15:47:26)

## 2020-11-29 ENCOUNTER — Telehealth: Payer: Self-pay | Admitting: Nurse Practitioner

## 2020-11-29 NOTE — Telephone Encounter (Signed)
REceived call from patient's husband. Temp 100.8, chills, malaise at home. Recent rituximab infusion. Start levaquin 500 mg daily. Notify clinic for worsening symptoms.

## 2020-12-01 ENCOUNTER — Telehealth: Payer: Self-pay | Admitting: *Deleted

## 2020-12-01 ENCOUNTER — Emergency Department
Admission: EM | Admit: 2020-12-01 | Discharge: 2020-12-01 | Disposition: A | Discharge status: Home / Self Care | Payer: Medicare Other | Attending: Emergency Medicine | Admitting: Emergency Medicine

## 2020-12-01 ENCOUNTER — Other Ambulatory Visit: Payer: Self-pay

## 2020-12-01 ENCOUNTER — Emergency Department: Payer: Medicare Other

## 2020-12-01 DIAGNOSIS — Z5321 Procedure and treatment not carried out due to patient leaving prior to being seen by health care provider: Secondary | ICD-10-CM | POA: Diagnosis not present

## 2020-12-01 DIAGNOSIS — J9811 Atelectasis: Secondary | ICD-10-CM | POA: Diagnosis not present

## 2020-12-01 DIAGNOSIS — R531 Weakness: Secondary | ICD-10-CM | POA: Diagnosis not present

## 2020-12-01 DIAGNOSIS — I700 Atherosclerosis of aorta: Secondary | ICD-10-CM | POA: Diagnosis not present

## 2020-12-01 LAB — COMPREHENSIVE METABOLIC PANEL
ALT: 26 U/L (ref 0–44)
AST: 18 U/L (ref 15–41)
Albumin: 2.7 g/dL — ABNORMAL LOW (ref 3.5–5.0)
Alkaline Phosphatase: 87 U/L (ref 38–126)
Anion gap: 10 (ref 5–15)
BUN: 37 mg/dL — ABNORMAL HIGH (ref 8–23)
CO2: 23 mmol/L (ref 22–32)
Calcium: 9 mg/dL (ref 8.9–10.3)
Chloride: 98 mmol/L (ref 98–111)
Creatinine, Ser: 1.08 mg/dL — ABNORMAL HIGH (ref 0.44–1.00)
GFR, Estimated: 53 mL/min — ABNORMAL LOW (ref >60)
Glucose, Bld: 123 mg/dL — ABNORMAL HIGH (ref 70–99)
Potassium: 4.3 mmol/L (ref 3.5–5.1)
Sodium: 131 mmol/L — ABNORMAL LOW (ref 135–145)
Total Bilirubin: 1.3 mg/dL — ABNORMAL HIGH (ref 0.3–1.2)
Total Protein: 7.6 g/dL (ref 6.5–8.1)

## 2020-12-01 LAB — CBC
HCT: 47.2 % — ABNORMAL HIGH (ref 36.0–46.0)
Hemoglobin: 16.5 g/dL — ABNORMAL HIGH (ref 12.0–15.0)
MCH: 31.1 pg (ref 26.0–34.0)
MCHC: 35 g/dL (ref 30.0–36.0)
MCV: 89.1 fL (ref 80.0–100.0)
Platelets: 36 10*3/uL — ABNORMAL LOW (ref 150–400)
RBC: 5.3 MIL/uL — ABNORMAL HIGH (ref 3.87–5.11)
RDW: 14.4 % (ref 11.5–15.5)
WBC: 12.4 10*3/uL — ABNORMAL HIGH (ref 4.0–10.5)
nRBC: 0 % (ref 0.0–0.2)

## 2020-12-01 LAB — TROPONIN I (HIGH SENSITIVITY): Troponin I (High Sensitivity): 7 ng/L (ref <18)

## 2020-12-01 NOTE — Telephone Encounter (Signed)
Please postpone pts visit for 2 weeks.

## 2020-12-01 NOTE — Telephone Encounter (Signed)
We received another message about this pt. Dr.Yu recommending for pt to get covid tested today and see [facility] tomorrow: lab/NP/ IVF.   Appts will be postponed for 2 weeks, per MD.

## 2020-12-01 NOTE — Telephone Encounter (Signed)
Patient husband called reporting that patient is not doing well post her treatment Friday. Patient got on phone and reports that she has no energy at all and has been horizontal for several days, she is weak and is not eating due to lack of appetite. She does state that she is drinking fluids. She states she has never felt this bad. I explained to her that it may be tomorrow before we can see her due to lateness of the day and she states that would be fine. Please advise and contact her for [facility] appointment, possible IV fluids .

## 2020-12-01 NOTE — Telephone Encounter (Signed)
Called patient and she accepts appointment for tomorrow at 10am. Recommended her to get Covid tested per MD.

## 2020-12-01 NOTE — ED Triage Notes (Signed)
Pt in with co generalized weakness for 3 days, no co pain. Denies any other symptoms, had chemo on Friday for lymphoma, symptoms started Saturday.

## 2020-12-02 ENCOUNTER — Inpatient Hospital Stay: Payer: Medicare Other | Admitting: Oncology

## 2020-12-02 ENCOUNTER — Inpatient Hospital Stay: Payer: Medicare Other

## 2020-12-02 ENCOUNTER — Inpatient Hospital Stay (HOSPITAL_BASED_OUTPATIENT_CLINIC_OR_DEPARTMENT_OTHER): Payer: Medicare Other | Admitting: Oncology

## 2020-12-02 ENCOUNTER — Other Ambulatory Visit: Payer: Self-pay | Admitting: Lab

## 2020-12-02 ENCOUNTER — Other Ambulatory Visit: Payer: Self-pay

## 2020-12-02 ENCOUNTER — Encounter: Payer: Self-pay | Admitting: Oncology

## 2020-12-02 ENCOUNTER — Other Ambulatory Visit: Payer: Self-pay | Admitting: Oncology

## 2020-12-02 ENCOUNTER — Inpatient Hospital Stay: Payer: Medicare Other | Attending: Oncology

## 2020-12-02 VITALS — BP 107/70

## 2020-12-02 VITALS — BP 101/69 | HR 105 | Temp 98.0°F | Resp 18

## 2020-12-02 DIAGNOSIS — N179 Acute kidney failure, unspecified: Secondary | ICD-10-CM | POA: Insufficient documentation

## 2020-12-02 DIAGNOSIS — C8580 Other specified types of non-Hodgkin lymphoma, unspecified site: Secondary | ICD-10-CM | POA: Diagnosis not present

## 2020-12-02 DIAGNOSIS — C8308 Small cell B-cell lymphoma, lymph nodes of multiple sites: Secondary | ICD-10-CM | POA: Insufficient documentation

## 2020-12-02 DIAGNOSIS — Z79899 Other long term (current) drug therapy: Secondary | ICD-10-CM | POA: Insufficient documentation

## 2020-12-02 DIAGNOSIS — T8069XA Other serum reaction due to other serum, initial encounter: Secondary | ICD-10-CM

## 2020-12-02 DIAGNOSIS — D696 Thrombocytopenia, unspecified: Secondary | ICD-10-CM | POA: Diagnosis not present

## 2020-12-02 DIAGNOSIS — R5383 Other fatigue: Secondary | ICD-10-CM | POA: Diagnosis not present

## 2020-12-02 LAB — CBC WITH DIFFERENTIAL/PLATELET
Abs Immature Granulocytes: 0.02 10*3/uL (ref 0.00–0.07)
Basophils Absolute: 0 10*3/uL (ref 0.0–0.1)
Basophils Relative: 0 %
Eosinophils Absolute: 0 10*3/uL (ref 0.0–0.5)
Eosinophils Relative: 0 %
HCT: 40 % (ref 36.0–46.0)
Hemoglobin: 13.4 g/dL (ref 12.0–15.0)
Immature Granulocytes: 0 %
Lymphocytes Relative: 40 %
Lymphs Abs: 3.6 10*3/uL (ref 0.7–4.0)
MCH: 30.3 pg (ref 26.0–34.0)
MCHC: 33.5 g/dL (ref 30.0–36.0)
MCV: 90.5 fL (ref 80.0–100.0)
Monocytes Absolute: 1.3 10*3/uL — ABNORMAL HIGH (ref 0.1–1.0)
Monocytes Relative: 14 %
Neutro Abs: 4 10*3/uL (ref 1.7–7.7)
Neutrophils Relative %: 46 %
Platelets: 44 10*3/uL — ABNORMAL LOW (ref 150–400)
RBC: 4.42 MIL/uL (ref 3.87–5.11)
RDW: 14.6 % (ref 11.5–15.5)
Smear Review: NORMAL
WBC: 8.9 10*3/uL (ref 4.0–10.5)
nRBC: 0 % (ref 0.0–0.2)

## 2020-12-02 LAB — BASIC METABOLIC PANEL
Anion gap: 7 (ref 5–15)
BUN: 34 mg/dL — ABNORMAL HIGH (ref 8–23)
CO2: 25 mmol/L (ref 22–32)
Calcium: 7.9 mg/dL — ABNORMAL LOW (ref 8.9–10.3)
Chloride: 99 mmol/L (ref 98–111)
Creatinine, Ser: 1.02 mg/dL — ABNORMAL HIGH (ref 0.44–1.00)
GFR, Estimated: 57 mL/min — ABNORMAL LOW (ref >60)
Glucose, Bld: 89 mg/dL (ref 70–99)
Potassium: 3.8 mmol/L (ref 3.5–5.1)
Sodium: 131 mmol/L — ABNORMAL LOW (ref 135–145)

## 2020-12-02 LAB — PATHOLOGIST SMEAR REVIEW

## 2020-12-02 LAB — URINALYSIS, COMPLETE (UACMP) WITH MICROSCOPIC
Bacteria, UA: NONE SEEN
Bilirubin Urine: NEGATIVE
Glucose, UA: NEGATIVE mg/dL
Hgb urine dipstick: NEGATIVE
Ketones, ur: 5 mg/dL — AB
Leukocytes,Ua: NEGATIVE
Nitrite: NEGATIVE
Protein, ur: NEGATIVE mg/dL
Specific Gravity, Urine: 1.016 (ref 1.005–1.030)
pH: 5 (ref 5.0–8.0)

## 2020-12-02 LAB — HEPATIC FUNCTION PANEL
ALT: 18 U/L (ref 0–44)
AST: 12 U/L — ABNORMAL LOW (ref 15–41)
Albumin: 2.2 g/dL — ABNORMAL LOW (ref 3.5–5.0)
Alkaline Phosphatase: 64 U/L (ref 38–126)
Bilirubin, Direct: 0.3 mg/dL — ABNORMAL HIGH (ref 0.0–0.2)
Indirect Bilirubin: 0.4 mg/dL (ref 0.3–0.9)
Total Bilirubin: 0.7 mg/dL (ref 0.3–1.2)
Total Protein: 6.2 g/dL — ABNORMAL LOW (ref 6.5–8.1)

## 2020-12-02 MED ORDER — METHYLPREDNISOLONE 4 MG PO TBPK: ORAL_TABLET | ORAL | 0 refills | Status: DC

## 2020-12-02 MED ORDER — SODIUM CHLORIDE 0.9 % IV SOLN
INTRAVENOUS | Status: DC
  Filled 2020-12-02 (×2): qty 250

## 2020-12-02 MED ORDER — SODIUM CHLORIDE 0.9 % IV SOLN
10.0000 mg | Freq: Once | INTRAVENOUS | Status: AC
  Administered 2020-12-02: 10 mg via INTRAVENOUS
  Filled 2020-12-02: qty 10

## 2020-12-02 NOTE — Progress Notes (Signed)
Hematology/Oncology progress note [facility] [phone] [phone]   Patient Care Team: [name], NP as PCP - General (Geriatric Medicine)  REFERRING PROVIDER: [name], NP  CHIEF COMPLAINTS/REASON FOR VISIT:  marginal zone lymphoma.   HISTORY OF PRESENTING ILLNESS:   Sherry Mosley is a  75 y.o.  female with PMH listed below was seen in consultation at the request of  [name], NP  for evaluation of marginal zone lymphoma.   Patient previously follows up with Dr.Kale and transfers her care to [facility] cancer center.  Previously records review was performed by me.   Patient moved from [place] to [place] 2 years ago. 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   Lymphoma was first diagnosed in 2014, as an incidental finding of inguinal lymphadenopathy. 09/2012,excisional biopsy of right inguinal lymph node showed non hodgkin's lymphoma Observation was recommended. Per Dr.Kale's note, 12/08/2012 Surgical pathology of right groin lymph node found: Abnormal Lymphoma FISH result, loss of IGH/14q DNA sequence  repeat CT showed enlarging lymphadenopathy.  She denies any consitutional symptoms.  05/05/18 through 08/18/18 She finishes chemotherapy R- CVP x 5 [ planned 8 cycles per patient]. She stopped the treatment due to severe fatigue, and also preparing move to [place]. She did not have any cytopenias related to her treatment.  Her father was diagnosed with stage 4 Prostate Cancer in his 52's and he lived for 11 years after diagnosis. There is no other family history of cancer or blood/immunological disorders  She established care with Dr.Kale on 12/12/2018 12/29/2018 PET showed low left cervical and right pelvic hypermetabolic adenopathy,consistent with active lymphoma. ([place]) 4. Aortic Atherosclerosis (ICD10-I70.0). Sinus disease. Possible constipation Observation was  recommended.  08/06/2019 CT chest abdomen pelvis showed 1. Mild increase in abdominal retroperitoneal and right pelvic lymphadenopathy. 2. No evidence of recurrent lymphoma within the chest. At least Stage III  A repeat lymph node biopsy was recommended. 08/27/2019 right inguinal lymph node US guided biopsy showed low grade B cell lymphoma with features most suggestive of marginal zone lymphoma. Ki 67 30% Flowcytometry showed CD5-, CD 10- clonal B cell population.   She was advised possibility of watching vs local ISRT to the bulkier rt inguinal and ext iliac lymphadenopathy. Patient prefers to wait and watch.  09/15/2020 PET scan showed Left cervical lymphadenopathy progressive since PET-CT 12/29/2018 (not included on chest abdomen pelvis CT of 08/06/2019) with De[place]ville 2 activity. Mild retrocrural lymphadenopathy in the chest is new since prior studies from 08/06/2019 and 12/29/2018 with De[place]ville 2 range activity. Clear progression of abdominopelvic lymphadenopathy since CT of 08/06/2019, demonstrating [place] 4 level uptake.  Aortic atherosclerosis.  Patient was advised to start rituximab single agent treatment.  Patient prefers to transfer her care to [facility] which is closer to her home.  Patient has chronic insomnia, patient takes Temazepam for many years.  She started herself on this medication and he tolerates well.  She has tried trazodone previously and not able to tolerate.  Dr. Irene Limbo gave her 6-monthsupply during last visit.  # I discussed with patient about the indolent disease nature and rationale of starting systemic chemotherapy treatment if patient has constitutional symptoms, organ threatening bulky disease, or cytopenia.  Right inguinal lymph node has become bulkier which potentially may cause leg swelling, pressure and discomfort. I think single agent rituximab weekly x4 is a very reasonable treatment option for low-grade non-Hodgkin's lymphoma. Rational and potential side effects  were discussed with patient. she agrees with the plan.     INTERVAL HISTORY Sherry Mosley is a 75 y.o. female who has above history reviewed by me today presents for acute visit for fever, rash, fatigue and weakness  she was accompanied by her husband.  Patient is status post weekly rituximab x2.  Her second dose was on 11/28/2020. Patient and husband reported history of development of rash on her cheek prior to the second dose of rituximab. 1 day after second dose of rituximab, patient developed fever, fatigue and weakness.  Patient called cancer center and talk to on-call provider Beckey Rutter and was prescribed empiric Levaquin.  Antibiotics did not help her symptoms.  Patient reports decreased appetite, very mild nausea, no vomiting, worsening of fatigue and weakness.  Also developed rash on her trunk, left groin and arms.  She presented to ER and had blood work done.  She left without being seen due to long waiting time Patient was advised to come to cancer center for evaluation and management of her symptoms Patient denies any cough, nasal congestion, shortness of breath, urinary symptoms.  She took her home testing for COVID-19 and was negative.  No acute bleeding events  Review of Systems  Constitutional:  Positive for appetite change and fatigue. Negative for chills and fever.  HENT:   Negative for hearing loss and voice change.   Eyes:  Negative for eye problems.  Respiratory:  Negative for chest tightness and cough.   Cardiovascular:  Negative for chest pain.  Gastrointestinal:  Positive for diarrhea. Negative for abdominal distention, abdominal pain and blood in stool.  Endocrine: Negative for hot flashes.  Genitourinary:  Negative for difficulty urinating and frequency.   Musculoskeletal:  Negative for arthralgias.  Skin:  Positive for rash. Negative for itching.  Neurological:  Negative for extremity weakness.  Hematological:  Positive for adenopathy.  Psychiatric/Behavioral:   Negative for confusion.    MEDICAL HISTORY:  Past Medical History:  Diagnosis Date   Generalized osteoarthritis    Hyperlipidemia    Hypertension    Mood disorder ([place])    Non Hodgkin's lymphoma ([place])    Sleep disorder     SURGICAL HISTORY: Past Surgical History:  Procedure Laterality Date   ABDOMINAL HYSTERECTOMY  1997   ANKLE ARTHRODESIS Right 2012   BREAST BIOPSY Left 1969   fibroadenoma   CATARACT EXTRACTION W/ INTRAOCULAR LENS IMPLANT Bilateral    LYMPH NODE BIOPSY  2014   excisional biopsy right groin   Thumb surgery     removal of part of extensor tendon    SOCIAL HISTORY: Social History   Socioeconomic History   Marital status: Married    Spouse name: Not on file   Number of children: 2   Years of education: Not on file   Highest education level: Not on file  Occupational History   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 Son  is alternate   Would accept resuscitation   No tube feeds if cognitively unaware   Social Determinants of Health   Financial Resource Strain: Not on file  Food Insecurity: Not on file  Transportation Needs: Not on file  Physical Activity: Not on file  Stress: Not on file  Social Connections: Not on file  Intimate Partner Violence: Not on file    FAMILY HISTORY: Family History  Problem Relation Age of Onset   Alzheimer's disease Mother    Heart failure Father    Prostate cancer Father    Diabetes Maternal Grandfather    Breast cancer Neg Hx     ALLERGIES:  is  allergic to cephalosporins and penicillins.  MEDICATIONS:  Current Outpatient Medications  Medication Sig Dispense Refill   acetaminophen (TYLENOL) 325 MG tablet Take 650 mg by mouth every 6 (six) hours as needed.     Ascorbic Acid (VITAMIN C) 1000 MG tablet Take 1 tablet by mouth daily.     Boswellia-Glucosamine-Vit D (OSTEO BI-FLEX ONE PER DAY PO) Take by mouth.     Cholecalciferol (VITAMIN D3) 50 MCG (2000 UT) capsule Take 1 capsule by mouth daily.     citalopram (CELEXA) 10 MG tablet Take 1 tablet (10 mg total) by mouth daily. 90 tablet 1   lisinopril (ZESTRIL) 5 MG tablet Take 1 tablet (5 mg total) by mouth daily. 90 tablet 1   Loratadine 10 MG CAPS Take 1 tablet by mouth daily.     methylPREDNISolone (MEDROL DOSEPAK) 4 MG TBPK tablet Day 1 take 6 tablets, Day 2 5 take 5 tablets, Day 3 take 4 tablets, Day 4 take 3 tablets,Day 5 take 2 tablets,Day 6 take 1 tablet and stop. 21 tablet 0   simvastatin (ZOCOR) 20 MG tablet Take 1 tablet (20 mg total) by mouth daily. 90 tablet 1   temazepam (RESTORIL) 15 MG capsule TAKE 1 CAPSULE BY MOUTH AT BEDTIME AS NEEDED FOR SLEEP. 30 capsule 1   vitamin E 400 UNIT capsule Take 1 capsule by mouth daily.     levofloxacin (LEVAQUIN) 500 MG tablet Take 500 mg by mouth daily.     No current facility-administered medications for this visit.     PHYSICAL EXAMINATION: ECOG PERFORMANCE STATUS: 2 - Symptomatic, <50% confined to bed Vitals:   12/02/20 0937  BP: 101/69  Pulse: (!) 105  Resp: 18  Temp: 98 F (36.7 C)  SpO2: 96%   There were no vitals filed for this visit.   Physical Exam Constitutional:      General: She is not in acute distress. HENT:     Head: Normocephalic and atraumatic.  Eyes:     General: No scleral icterus. Cardiovascular:     Rate and Rhythm: Normal rate and regular rhythm.     Heart sounds: Normal heart sounds.  Pulmonary:     Effort: Pulmonary effort is normal. No respiratory distress.     Breath sounds: No wheezing.   Abdominal:     General: Bowel sounds are normal. There is no distension.     Palpations: Abdomen is soft.  Musculoskeletal:        General: No deformity. Normal range of motion.     Cervical back: Normal range of motion and neck supple.  Skin:    General: Skin is warm and dry.     Findings: Rash present. No erythema.     Comments: Maculopapular rash on her forehead, cheek, anterior chest wall, left arm and left groin area.  Neurological:     Mental Status: She is alert and oriented to person, place, and time. Mental status is at baseline.     Cranial Nerves: No cranial nerve deficit.     Coordination: Coordination normal.  Psychiatric:        Mood and Affect: Mood normal.   Right inguinal lymphadenopathy    LABORATORY DATA:  I have reviewed the data as listed Lab Results  Component Value Date   WBC 8.9 12/02/2020   HGB 13.4 12/02/2020   HCT 40.0 12/02/2020   MCV 90.5 12/02/2020   PLT 44 (L) 12/02/2020   Recent Labs    11/28/20 0831 12/01/20 2058 12/02/20 1148  NA 132* 131* 131*  K 4.2 4.3 3.8  CL 96* 98 99  CO2 $Re'27 23 25  'yXR$ GLUCOSE 154* 123* 89  BUN 23 37* 34*  CREATININE 0.99 1.08* 1.02*  CALCIUM 9.1 9.0 7.9*  GFRNONAA 59* 53* 57*  PROT 8.5* 7.6 6.2*  ALBUMIN 3.3* 2.7* 2.2*  AST 39 18 12*  ALT 42 26 18  ALKPHOS 93 87 64  BILITOT 0.9 1.3* 0.7  BILIDIR  --   --  0.3*  IBILI  --   --  0.4    Iron/TIBC/Ferritin/ %Sat No results found for: IRON, TIBC, FERRITIN, IRONPCTSAT    RADIOGRAPHIC STUDIES: I have personally reviewed the radiological images as listed and agreed with the findings in the report. DG Chest 2 View  Result Date: 12/01/2020 CLINICAL DATA:  Weakness x3 days. EXAM: CHEST - 2 VIEW COMPARISON:  None. FINDINGS: Very mild atelectasis is seen within the left lung base. There is no evidence of acute infiltrate, pleural effusion or pneumothorax. The heart size and mediastinal contours are within normal limits. There is moderate severity calcification of  the aortic arch. The visualized skeletal structures are unremarkable. IMPRESSION: Very mild left basilar atelectasis. Electronically Signed   By: Virgina Norfolk M.D.   On: 12/01/2020 21:29   NM PET Image Restag (PS) Skull Base To Thigh  Result Date: 09/16/2020 CLINICAL DATA:  Subsequent treatment strategy for marginal zone lymphoma. EXAM: NUCLEAR MEDICINE PET SKULL BASE TO THIGH TECHNIQUE: 7.9 mCi F-18 FDG was injected intravenously. Full-ring PET imaging was performed from the skull base to thigh after the radiotracer. CT data was obtained and used for attenuation correction and anatomic localization. Fasting blood glucose: 92 mg/dl COMPARISON:  Chest abdomen pelvis CT 08/06/2019.  PET-CT 12/29/2018 FINDINGS: Mediastinal blood pool activity: SUV max 2.4 Liver activity: SUV max 3.1 NECK: Left posterior triangle index lymph node measured on previous PET-CT at 1.4 cm short axis is 2.1 cm short axis today. SUV max = 2.4. Incidental CT findings: none CHEST: Scattered tiny lymph nodes are seen in the subpectoral and axillary regions bilaterally. No mediastinal or hilar lymphadenopathy evident on this noncontrast CT. Retrocrural lymphadenopathy seen on image 120/3 measures up to 11 mm short axis with SUV max = 2.1. These lymph nodes are progressive since 08/06/2019. No suspicious pulmonary nodules on the CT scan. Incidental CT findings: 14 mm nodular opacity identified deep posterior right costophrenic sulcus (122/3) this may be secondary to atelectasis or infection. Mild atherosclerotic calcification is noted in the wall of the thoracic aorta. Tiny hiatal hernia. ABDOMEN/PELVIS: Interval progression of abdominopelvic lymphadenopathy since CT scan of 08/06/2019 with lymphadenopathy in the central mesentery, retroperitoneal space of the abdomen, right pelvic sidewall and groin regions, right greater than left Index 1.8 cm external iliac node measured previously is now 3.4 cm short axis on  image 180/3. SUV max = 3.6.  Index right external iliac node measured previously at 3.8 cm short axis is now 5.2 cm (196/3) SUV max = 4.1. Nodal conglomeration measured previously in the right groin at 5.3 cm short axis is now 6.8 cm short axis (244/3). SUV max = 3.8. Spleen measures 13.9 x 8.5 x 16.2 cm (1001 cc). FDG accumulation is slightly higher in splenic parenchyma than liver with SUV max = 3.3. Incidental CT findings: Central sinus cysts again noted in both kidneys. There is mild atherosclerotic calcification of the abdominal aorta without aneurysm. Tiny hypodensity inferior right liver is stable, likely a cyst. SKELETON: No focal hypermetabolic activity to suggest skeletal metastasis. Incidental CT findings: none IMPRESSION: 1. Left cervical lymphadenopathy progressive since PET-CT 12/29/2018 (not included on chest abdomen pelvis CT of 08/06/2019) with De[place]ville 2 activity. 2. Mild retrocrural lymphadenopathy in the chest is new since prior studies from 08/06/2019 and 12/29/2018 with [place] 2 range activity. 3. Clear progression of abdominopelvic lymphadenopathy since CT of 08/06/2019, demonstrating [place] 4 level uptake. 4.  Aortic Atherosclerois (ICD10-170.0) Electronically Signed   By: Misty Stanley M.D.   On: 09/16/2020 06:03       ASSESSMENT & PLAN:  1. Marginal zone lymphoma ([place])   2. Thrombocytopenia ([facility])   3. Other fatigue   4. AKI (acute kidney injury) ([place])   5. Serum sickness due to drug, initial encounter    #stage III marginal zone lymphoma. Status post weekly rituximab x2. Hold additional treatment. See below.  #Fever, fatigue,Thrombocytopenia, skin rash Check CBC, CMP, UA and urine culture. Labs reviewed and discussed with patient. Thrombocytopenia, platelet count was 36,000 yesterday.  Today count has improved to 44,000. CXR showed atelectasis. UA today is negative. Culture is pending. She is afebrile Smear showedIncrease proportion of atypical lymphocytes.  No circulating blast,  Schistocytes. Clinically,I suspect  she has developed acute serum sickness due to Rituximab. Other etiologies acute viral infection, levaquin induced thrombocytopenia, although her other symptoms developed prior to levaquin treatment. I advise her to stop Levequin.   Patient was given dexamethasone 10mg  x 1 today.  Advise her to use benadryl for iching and rash.  Medrol dose pack x 1 , 21 tablets, 6 days course Rx sent to her pharmacy.   AKI, likely due to decreased oral intake/dehydration.  IVF 1L NS x 1.  I offered her to have daily hydration session. Patient wants to defer and she will call if she needs.  She will follow up with in 3 days for follow up of her symptoms.    All questions were answered. The patient knows to call the clinic with any problems questions or concerns.  cc [name], NP    Follow up in 3 days.    Earlie Server, MD, PhD 12/02/2020

## 2020-12-02 NOTE — Progress Notes (Signed)
Pt presents to [facility]. States that she feels very weak and tired. She reports mild nausea but no vomiting. Also reports poor appetite. She is currently taking Levaquin. Denies any more fever since Saturday. Denies urinary symptoms. She reports that she went to the ED last night and had labs drawn, but waited 3 hours and did not see a provider, so they decided to leave.

## 2020-12-02 NOTE — Progress Notes (Signed)
Pt reports feeling much better after receiving IVF and dexamethasone. Pt will call us tomorrow if she feels that she needs additional fluids.

## 2020-12-03 LAB — URINE CULTURE

## 2020-12-05 ENCOUNTER — Encounter: Payer: Self-pay | Admitting: Oncology

## 2020-12-05 ENCOUNTER — Inpatient Hospital Stay: Payer: Medicare Other

## 2020-12-05 ENCOUNTER — Inpatient Hospital Stay (HOSPITAL_BASED_OUTPATIENT_CLINIC_OR_DEPARTMENT_OTHER): Payer: Medicare Other | Admitting: Oncology

## 2020-12-05 ENCOUNTER — Other Ambulatory Visit: Payer: Self-pay

## 2020-12-05 VITALS — BP 157/93 | HR 98 | Resp 18

## 2020-12-05 VITALS — BP 142/93 | HR 111 | Temp 97.4°F | Resp 18 | Wt 145.0 lb

## 2020-12-05 DIAGNOSIS — C8580 Other specified types of non-Hodgkin lymphoma, unspecified site: Secondary | ICD-10-CM | POA: Diagnosis not present

## 2020-12-05 DIAGNOSIS — R5383 Other fatigue: Secondary | ICD-10-CM

## 2020-12-05 DIAGNOSIS — G4709 Other insomnia: Secondary | ICD-10-CM

## 2020-12-05 DIAGNOSIS — C8308 Small cell B-cell lymphoma, lymph nodes of multiple sites: Secondary | ICD-10-CM | POA: Diagnosis not present

## 2020-12-05 DIAGNOSIS — T8069XA Other serum reaction due to other serum, initial encounter: Secondary | ICD-10-CM

## 2020-12-05 DIAGNOSIS — Z79899 Other long term (current) drug therapy: Secondary | ICD-10-CM | POA: Diagnosis not present

## 2020-12-05 DIAGNOSIS — T8069XD Other serum reaction due to other serum, subsequent encounter: Secondary | ICD-10-CM

## 2020-12-05 DIAGNOSIS — D696 Thrombocytopenia, unspecified: Secondary | ICD-10-CM

## 2020-12-05 DIAGNOSIS — Z7189 Other specified counseling: Secondary | ICD-10-CM

## 2020-12-05 LAB — COMPREHENSIVE METABOLIC PANEL
ALT: 16 U/L (ref 0–44)
AST: 19 U/L (ref 15–41)
Albumin: 2.9 g/dL — ABNORMAL LOW (ref 3.5–5.0)
Alkaline Phosphatase: 65 U/L (ref 38–126)
Anion gap: 7 (ref 5–15)
BUN: 21 mg/dL (ref 8–23)
CO2: 29 mmol/L (ref 22–32)
Calcium: 8.8 mg/dL — ABNORMAL LOW (ref 8.9–10.3)
Chloride: 99 mmol/L (ref 98–111)
Creatinine, Ser: 0.84 mg/dL (ref 0.44–1.00)
GFR, Estimated: >60 mL/min (ref >60)
Glucose, Bld: 95 mg/dL (ref 70–99)
Potassium: 3.5 mmol/L (ref 3.5–5.1)
Sodium: 135 mmol/L (ref 135–145)
Total Bilirubin: 0.7 mg/dL (ref 0.3–1.2)
Total Protein: 7.6 g/dL (ref 6.5–8.1)

## 2020-12-05 LAB — CBC WITH DIFFERENTIAL/PLATELET
Abs Immature Granulocytes: 0.04 10*3/uL (ref 0.00–0.07)
Basophils Absolute: 0 10*3/uL (ref 0.0–0.1)
Basophils Relative: 1 %
Eosinophils Absolute: 0.1 10*3/uL (ref 0.0–0.5)
Eosinophils Relative: 1 %
HCT: 44.8 % (ref 36.0–46.0)
Hemoglobin: 14.8 g/dL (ref 12.0–15.0)
Immature Granulocytes: 1 %
Lymphocytes Relative: 48 %
Lymphs Abs: 3.3 10*3/uL (ref 0.7–4.0)
MCH: 30.1 pg (ref 26.0–34.0)
MCHC: 33 g/dL (ref 30.0–36.0)
MCV: 91.2 fL (ref 80.0–100.0)
Monocytes Absolute: 0.6 10*3/uL (ref 0.1–1.0)
Monocytes Relative: 10 %
Neutro Abs: 2.6 10*3/uL (ref 1.7–7.7)
Neutrophils Relative %: 39 %
Platelets: 110 10*3/uL — ABNORMAL LOW (ref 150–400)
RBC: 4.91 MIL/uL (ref 3.87–5.11)
RDW: 14.3 % (ref 11.5–15.5)
WBC: 6.6 10*3/uL (ref 4.0–10.5)
nRBC: 0 % (ref 0.0–0.2)

## 2020-12-05 MED ORDER — SODIUM CHLORIDE 0.9 % IV SOLN
INTRAVENOUS | Status: DC
  Filled 2020-12-05 (×2): qty 250

## 2020-12-05 NOTE — Progress Notes (Signed)
Received 1 L NS for hydration. Discharged to home. Accompanied by spouse.

## 2020-12-05 NOTE — Progress Notes (Signed)
Hematology/Oncology progress note [facility] [phone] [phone]   Patient Care Team: Sherry Chandler, NP as PCP - General (Geriatric Medicine)  REFERRING PROVIDER: [name], NP  CHIEF COMPLAINTS/REASON FOR VISIT:  marginal zone lymphoma.   HISTORY OF PRESENTING ILLNESS:   Sherry Mosley is a  75 y.o.  female with PMH listed below was seen in consultation at the request of  Sherry Chandler, NP  for evaluation of marginal zone lymphoma.   Patient previously follows up with SherryKale and transfers her care to [facility].  Previously records review was performed by me.   Patient moved from [place] to [place] 2 years ago. 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   Lymphoma was first diagnosed in 2014, as an incidental finding of inguinal lymphadenopathy. 09/2012,excisional biopsy of right inguinal lymph node showed non hodgkin's lymphoma Observation was recommended. Per SherryKale's note, 12/08/2012 Surgical pathology of right groin lymph node found: Abnormal Lymphoma FISH result, loss of IGH/14q DNA sequence  repeat CT showed enlarging lymphadenopathy.  She denies any consitutional symptoms.  05/05/18 through 08/18/18 She finishes chemotherapy R- CVP x 5 [ planned 8 cycles per patient]. She stopped the treatment due to severe fatigue, and also preparing move to [place]. She did not have any cytopenias related to her treatment.  Her father was diagnosed with stage 4 Prostate Cancer in his 52's and he lived for 11 years after diagnosis. There is no other family history of cancer or blood/immunological disorders  She established care with SherryKale on 12/12/2018 12/29/2018 PET showed low left cervical and right pelvic hypermetabolic adenopathy,consistent with active lymphoma. ([place]) 4. Aortic Atherosclerosis (ICD10-I70.0). Sinus disease. Possible constipation Observation was  recommended.  08/06/2019 CT chest abdomen pelvis showed 1. Mild increase in abdominal retroperitoneal and right pelvic lymphadenopathy. 2. No evidence of recurrent lymphoma within the chest. At least Stage III  A repeat lymph node biopsy was recommended. 08/27/2019 right inguinal lymph node US guided biopsy showed low grade B cell lymphoma with features most suggestive of marginal zone lymphoma. Ki 67 30% Flowcytometry showed CD5-, CD 10- clonal B cell population.   She was advised possibility of watching vs local ISRT to the bulkier rt inguinal and ext iliac lymphadenopathy. Patient prefers to wait and watch.  09/15/2020 PET scan showed Left cervical lymphadenopathy progressive since PET-CT 12/29/2018 (not included on chest abdomen pelvis CT of 08/06/2019) with Deauville 2 activity. Mild retrocrural lymphadenopathy in the chest is new since prior studies from 08/06/2019 and 12/29/2018 with Deauville 2 range activity. Clear progression of abdominopelvic lymphadenopathy since CT of 08/06/2019, demonstrating [place]ville 4 level uptake.  Aortic atherosclerosis.  Patient was advised to start rituximab single agent treatment.  Patient prefers to transfer her care to [facility] which is closer to her home.  Patient has chronic insomnia, patient takes Temazepam for many years.  She started herself on this medication and he tolerates well.  She has tried trazodone previously and not able to tolerate.  Sherry Mosley gave her 6-monthsupply during last visit.  # I discussed with patient about the indolent disease nature and rationale of starting systemic chemotherapy treatment if patient has constitutional symptoms, organ threatening bulky disease, or cytopenia.  Right inguinal lymph node has become bulkier which potentially may cause leg swelling, pressure and discomfort. I think single agent rituximab weekly x4 is a very reasonable treatment option for low-grade non-Hodgkin's lymphoma. Rational and potential side effects  were discussed with patient. she agrees with the plan.     INTERVAL HISTORY Sherry Mosley is a 75 y.o. female who has above history reviewed by me today presents for follow up visit for fever, rash, fatigue and weakness  she was accompanied by her husband.  Patient is status post weekly rituximab x2.  Her second dose was on 11/28/2020. No more fever episodes.  No nausea, vomiting,  cough. She feels slightly better, appetite is still poor.  She did not take Medrol Dosepak.  She applied topical triamcinolones  and rash on her left arm has improved. She also got Dexamethasone IV earlier this week.  She still has some redness on her face.    Review of Systems  Constitutional:  Positive for appetite change and fatigue. Negative for chills and fever.  HENT:   Negative for hearing loss and voice change.   Eyes:  Negative for eye problems.  Respiratory:  Negative for chest tightness and cough.   Cardiovascular:  Negative for chest pain.  Gastrointestinal:  Positive for diarrhea. Negative for abdominal distention, abdominal pain and blood in stool.  Endocrine: Negative for hot flashes.  Genitourinary:  Negative for difficulty urinating and frequency.   Musculoskeletal:  Negative for arthralgias.  Skin:  Positive for rash. Negative for itching.  Neurological:  Negative for extremity weakness.  Hematological:  Positive for adenopathy.  Psychiatric/Behavioral:  Negative for confusion.    MEDICAL HISTORY:  Past Medical History:  Diagnosis Date   Generalized osteoarthritis    Hyperlipidemia    Hypertension    Mood disorder ([place])    Non Hodgkin's lymphoma ([place])    Sleep disorder     SURGICAL HISTORY: Past Surgical History:  Procedure Laterality Date   ABDOMINAL HYSTERECTOMY  1997   ANKLE ARTHRODESIS Right 2012   BREAST BIOPSY Left 1969   fibroadenoma   CATARACT EXTRACTION W/ INTRAOCULAR LENS IMPLANT Bilateral    LYMPH NODE BIOPSY  2014   excisional biopsy right groin   Thumb surgery      removal of part of extensor tendon    SOCIAL HISTORY: Social History   Socioeconomic History   Marital status: Married    Spouse name: Not on file   Number of children: 2   Years of education: Not on file   Highest education level: Not on file  Occupational History   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 Son is alternate   Would accept resuscitation   No tube feeds if cognitively unaware   Social Determinants of Health   Financial Resource Strain: Not on file  Food Insecurity: Not on file  Transportation Needs: Not on file  Physical Activity: Not on file  Stress: Not on file  Social Connections: Not on file  Intimate Partner Violence: Not on file    FAMILY HISTORY: Family History  Problem Relation Age of Onset   Alzheimer's disease Mother  Heart failure Father    Prostate cancer Father    Diabetes Maternal Grandfather    Breast cancer Neg Hx     ALLERGIES:  is allergic to cephalosporins and penicillins.  MEDICATIONS:  Current Outpatient Medications  Medication Sig Dispense Refill   acetaminophen (TYLENOL) 325 MG tablet Take 650 mg by mouth every 6 (six) hours as needed.     Ascorbic Acid (VITAMIN C) 1000 MG tablet Take 1 tablet by mouth daily.     Boswellia-Glucosamine-Vit D (OSTEO BI-FLEX ONE PER DAY PO) Take by mouth.     Cholecalciferol (VITAMIN D3) 50 MCG (2000 UT) capsule Take 1 capsule by mouth daily.     citalopram (CELEXA) 10 MG tablet Take 1 tablet (10 mg total) by mouth daily. 90 tablet 1    lisinopril (ZESTRIL) 5 MG tablet Take 1 tablet (5 mg total) by mouth daily. 90 tablet 1   Loratadine 10 MG CAPS Take 1 tablet by mouth daily.     simvastatin (ZOCOR) 20 MG tablet Take 1 tablet (20 mg total) by mouth daily. 90 tablet 1   temazepam (RESTORIL) 15 MG capsule TAKE 1 CAPSULE BY MOUTH AT BEDTIME AS NEEDED FOR SLEEP. 30 capsule 1   vitamin E 400 UNIT capsule Take 1 capsule by mouth daily.     methylPREDNISolone (MEDROL DOSEPAK) 4 MG TBPK tablet Day 1 take 6 tablets, Day 2 5 take 5 tablets, Day 3 take 4 tablets, Day 4 take 3 tablets,Day 5 take 2 tablets,Day 6 take 1 tablet and stop. 21 tablet 0   No current facility-administered medications for this visit.   Facility-Administered Medications Ordered in Other Visits  Medication Dose Route Frequency Provider Last Rate Last Admin   0.9 %  sodium chloride infusion   Intravenous Continuous Earlie Server, MD 999 mL/hr at 12/05/20 0928 New Bag at 12/05/20 0928     PHYSICAL EXAMINATION: ECOG PERFORMANCE STATUS: 2 - Symptomatic, <50% confined to bed Vitals:   12/05/20 0835  BP: (!) 142/93  Pulse: (!) 111  Resp: 18  Temp: (!) 97.4 F (36.3 C)  SpO2: 97%   Filed Weights   12/05/20 0835  Weight: 145 lb (65.8 kg)     Physical Exam Constitutional:      General: She is not in acute distress. HENT:     Head: Normocephalic and atraumatic.  Eyes:     General: No scleral icterus. Cardiovascular:     Rate and Rhythm: Normal rate and regular rhythm.     Heart sounds: Normal heart sounds.  Pulmonary:     Effort: Pulmonary effort is normal. No respiratory distress.     Breath sounds: No wheezing.  Abdominal:     General: Bowel sounds are normal. There is no distension.     Palpations: Abdomen is soft.  Musculoskeletal:        General: No deformity. Normal range of motion.     Cervical back: Normal range of motion and neck supple.  Skin:    General: Skin is warm and dry.     Findings: Rash present. No erythema.     Comments:  Maculopapular rash on her cheek.  Neurological:     Mental Status: She is alert and oriented to person, place, and time. Mental status is at baseline.     Cranial Nerves: No cranial nerve deficit.     Coordination: Coordination normal.  Psychiatric:        Mood and Affect: Mood normal.   Right inguinal lymphadenopathy  LABORATORY DATA:  I have reviewed the data as listed Lab Results  Component Value Date   WBC 6.6 12/05/2020   HGB 14.8 12/05/2020   HCT 44.8 12/05/2020   MCV 91.2 12/05/2020   PLT 110 (L) 12/05/2020   Recent Labs    12/01/20 2058 12/02/20 1148 12/05/20 0815  NA 131* 131* 135  K 4.3 3.8 3.5  CL 98 99 99  CO2 _0 GLUCOSE 123* 89 95  BUN 37* 34* 21  CREATININE 1.08* 1.02* 0.84  CALCIUM 9.0 7.9* 8.8*  GFRNONAA 53* 57* >60  PROT 7.6 6.2* 7.6  ALBUMIN 2.7* 2.2* 2.9*  AST 18 12* 19  ALT _1 ALKPHOS 87 64 65  BILITOT 1.3* 0.7 0.7  BILIDIR  --  0.3*  --   IBILI  --  0.4  --     Iron/TIBC/Ferritin/ %Sat No results found for: IRON, TIBC, FERRITIN, IRONPCTSAT    RADIOGRAPHIC STUDIES: I have personally reviewed the radiological images as listed and agreed with the findings in the report. DG Chest 2 View  Result Date: 12/01/2020 CLINICAL DATA:  Weakness x3 days. EXAM: CHEST - 2 VIEW COMPARISON:  None. FINDINGS: Very mild atelectasis is seen within the left lung base. There is no evidence of acute infiltrate, pleural effusion or pneumothorax. The heart size and mediastinal contours are within normal limits. There is moderate severity calcification of the aortic arch. The visualized skeletal structures are unremarkable. IMPRESSION: Very mild left basilar atelectasis. Electronically Signed   By: Virgina Norfolk M.D.   On: 12/01/2020 21:29   NM PET Image Restag (PS) Skull Base To Thigh  Result Date: 09/16/2020 CLINICAL DATA:  Subsequent treatment strategy for marginal zone lymphoma. EXAM: NUCLEAR MEDICINE PET SKULL BASE TO THIGH TECHNIQUE: 7.9 mCi  F-18 FDG was injected intravenously. Full-ring PET imaging was performed from the skull base to thigh after the radiotracer. CT data was obtained and used for attenuation correction and anatomic localization. Fasting blood glucose: 92 mg/dl COMPARISON:  Chest abdomen pelvis CT 08/06/2019.  PET-CT 12/29/2018 FINDINGS: Mediastinal blood pool activity: SUV max 2.4 Liver activity: SUV max 3.1 NECK: Left posterior triangle index lymph node measured on previous PET-CT at 1.4 cm short axis is 2.1 cm short axis today. SUV max = 2.4. Incidental CT findings: none CHEST: Scattered tiny lymph nodes are seen in the subpectoral and axillary regions bilaterally. No mediastinal or hilar lymphadenopathy evident on this noncontrast CT. Retrocrural lymphadenopathy seen on image 120/3 measures up to 11 mm short axis with SUV max = 2.1. These lymph nodes are progressive since 08/06/2019. No suspicious pulmonary nodules on the CT scan. Incidental CT findings: 14 mm nodular opacity identified deep posterior right costophrenic sulcus (122/3) this may be secondary to atelectasis or infection. Mild atherosclerotic calcification is noted in the wall of the thoracic aorta. Tiny hiatal hernia. ABDOMEN/PELVIS: Interval progression of abdominopelvic lymphadenopathy since CT scan of 08/06/2019 with lymphadenopathy in the central mesentery, retroperitoneal space of the abdomen, right pelvic sidewall and groin regions, right greater than left Index 1.8 cm external iliac node measured previously is now 3.4 cm short axis on image 180/3. SUV max = 3.6. Index right external iliac node measured previously at 3.8 cm short axis is now 5.2 cm (196/3) SUV max = 4.1. Nodal conglomeration measured previously in the right groin at 5.3 cm short axis is now 6.8 cm short axis (244/3). SUV max = 3.8. Spleen measures 13.9 x 8.5 x 16.2 cm (1001 cc).  FDG accumulation is slightly higher in splenic parenchyma than liver with SUV max = 3.3. Incidental CT findings:  Central sinus cysts again noted in both kidneys. There is mild atherosclerotic calcification of the abdominal aorta without aneurysm. Tiny hypodensity inferior right liver is stable, likely a cyst. SKELETON: No focal hypermetabolic activity to suggest skeletal metastasis. Incidental CT findings: none IMPRESSION: 1. Left cervical lymphadenopathy progressive since PET-CT 12/29/2018 (not included on chest abdomen pelvis CT of 08/06/2019) with Deauville 2 activity. 2. Mild retrocrural lymphadenopathy in the chest is new since prior studies from 08/06/2019 and 12/29/2018 with De[place] 2 range activity. 3. Clear progression of abdominopelvic lymphadenopathy since CT of 08/06/2019, demonstrating [place] 4 level uptake. 4.  Aortic Atherosclerois (ICD10-170.0) Electronically Signed   By: Misty Stanley M.D.   On: 09/16/2020 06:03       ASSESSMENT & PLAN:  1. Thrombocytopenia ([place])   2. Marginal zone lymphoma ([facility])   3. Other fatigue   4. Serum sickness due to drug, subsequent encounter    #stage III marginal zone lymphoma. Status post weekly rituximab x2. Hold additional treatment. See below.  #Fever, fatigue,Thrombocytopenia, skin rash Labs are reviewed and discussed with patient. Thrombocytopenia has improved.  Clinically, she is doing slightly better.  Appetite is still poor Rash has improved , not totally resolved.  Recommend her to try medrol dosepak. Rx was sent after last visit.   # Tachycardia,  Regula rhythm.  Probably due to decreased oral intake.  Recommend IVF 1L NS x 2.   # AKI resolved.   Anticipate her to continue improved in the next few days.  # Marginal zone lymphoma.  She did not tolerate Rituximab  Will hold rest planned treatment.   Follow up in 2 months. All questions were answered. The patient knows to call the clinic with any problems questions or concerns.  cc Sherry Chandler, NP      Earlie Server, MD, PhD 12/05/2020

## 2020-12-16 ENCOUNTER — Ambulatory Visit
Admission: RE | Admit: 2020-12-16 | Discharge: 2020-12-16 | Disposition: A | Discharge status: Home / Self Care | Payer: Medicare Other | Source: Ambulatory Visit | Attending: Nurse Practitioner | Admitting: Nurse Practitioner

## 2020-12-16 ENCOUNTER — Other Ambulatory Visit: Payer: Self-pay

## 2020-12-16 DIAGNOSIS — Z1231 Encounter for screening mammogram for malignant neoplasm of breast: Secondary | ICD-10-CM | POA: Insufficient documentation

## 2020-12-18 ENCOUNTER — Encounter: Payer: Self-pay | Admitting: Gastroenterology

## 2020-12-18 ENCOUNTER — Other Ambulatory Visit: Payer: Self-pay

## 2020-12-18 ENCOUNTER — Ambulatory Visit: Payer: Medicare Other | Admitting: Gastroenterology

## 2020-12-18 VITALS — BP 118/72 | HR 102 | Ht 63.0 in | Wt 141.6 lb

## 2020-12-18 DIAGNOSIS — R197 Diarrhea, unspecified: Secondary | ICD-10-CM

## 2020-12-18 NOTE — Progress Notes (Signed)
Gastroenterology Consultation  Referring Provider:     Brunetta Genera, MD Primary Care Physician:  [name], NP Primary Gastroenterologist:  Dr. Allen Norris     Reason for Consultation:     Diarrhea        HPI:   Sherry Mosley is a 75 y.o. y/o female referred for consultation & management of Diarrhea by Dr. Dewaine Oats, Carlos American, NP.  This patient comes in today with a report of diarrhea.  She states the diarrhea started approximately in July and at that time she reports the diarrhea to be only loose bowel movements without any gas or abdominal pain associated with it. The loose bowel movements are not explosive and she did try to stop dairy products but did not notice any difference.  The diarrhea does not wake her up from a sleep. The patient reports that she had a colonoscopy 3 years ago that was normal.  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 The patient denies any black stools or bloody stools.  She also denies any unexplained weight loss fevers chills nausea or vomiting.  Past Medical History:  Diagnosis Date   Generalized osteoarthritis    Hyperlipidemia    Hypertension    Mood disorder ([place])    Non Hodgkin's lymphoma ([place])    Sleep disorder     Past Surgical History:  Procedure Laterality Date   ABDOMINAL HYSTERECTOMY  1997   ANKLE ARTHRODESIS Right 2012   BREAST BIOPSY Left 1969   fibroadenoma   CATARACT EXTRACTION W/ INTRAOCULAR LENS IMPLANT Bilateral    LYMPH NODE BIOPSY  2014   excisional biopsy right groin   Thumb surgery     removal of part of extensor tendon    Prior to Admission medications   Medication Sig Start Date End Date Taking? Authorizing Provider  Ascorbic Acid (VITAMIN C) 1000 MG tablet Take 1 tablet by mouth daily.   Yes [provider]  Boswellia-Glucosamine-Vit D (OSTEO BI-FLEX ONE PER DAY PO) Take by mouth.   Yes [provider]  Cholecalciferol (VITAMIN D3) 50 MCG (2000 UT) capsule Take 1  capsule by mouth daily.   Yes [provider]  citalopram (CELEXA) 10 MG tablet Take 1 tablet (10 mg total) by mouth daily. 10/30/20  Yes [name], NP  lisinopril (ZESTRIL) 5 MG tablet Take 1 tablet (5 mg total) by mouth daily. 10/30/20  Yes [name], NP  Loratadine 10 MG CAPS Take 1 tablet by mouth daily.   Yes [provider]  simvastatin (ZOCOR) 20 MG tablet Take 1 tablet (20 mg total) by mouth daily. 10/30/20  Yes [name], NP  temazepam (RESTORIL) 15 MG capsule TAKE 1 CAPSULE BY MOUTH AT BEDTIME AS NEEDED FOR SLEEP. 11/06/20  Yes [name], NP  vitamin E 400 UNIT capsule Take 1 capsule by mouth daily.   Yes [provider]  acetaminophen (TYLENOL) 325 MG tablet Take 650 mg by mouth every 6 (six) hours as needed. Patient not taking: Reported on 12/18/2020    [provider]    Family History  Problem Relation Age of Onset   Alzheimer's disease Mother    Heart failure Father    Prostate cancer Father    Diabetes Maternal Grandfather    Breast cancer Neg Hx      Social History   Tobacco Use   Smoking status: Never   Smokeless tobacco: Never  Vaping Use   Vaping  Use: Never used  Substance Use Topics   Alcohol use: Not Currently    Comment: Rare   Drug use: Never    Allergies as of 12/18/2020 - Review Complete 12/18/2020  Allergen Reaction Noted   Cephalosporins  12/12/2018   Penicillins  12/12/2018    Review of Systems:    All systems reviewed and negative except where noted in HPI.   Physical Exam:  BP 118/72 (BP Location: Right Arm, Patient Position: Sitting, Cuff Size: Normal)   Pulse (!) 102   Ht 5\' 3"  (1.6 m)   Wt 141 lb 9.6 oz (64.2 kg)   BMI 25.08 kg/m  No LMP recorded. Patient has had a hysterectomy. General:   Alert,  Well-developed, well-nourished, pleasant and cooperative in NAD Head:  Normocephalic and atraumatic. Eyes:  Sclera clear, no icterus.   Conjunctiva pink. Ears:  Normal  auditory acuity. Neck:  Supple; no masses or thyromegaly. Lungs:  Respirations even and unlabored.  Clear throughout to auscultation.   No wheezes, crackles, or rhonchi. No acute distress. Heart:  Regular rate and rhythm; no murmurs, clicks, rubs, or gallops. Abdomen:  Normal bowel sounds.  No bruits.  Soft, non-tender and non-distended without masses, hepatosplenomegaly or hernias noted.  No guarding or rebound tenderness.  Negative Carnett sign.   Rectal:  Deferred.  Pulses:  Normal pulses noted. Extremities:  No clubbing or edema.  No cyanosis. Neurologic:  Alert and oriented x3;  grossly normal neurologically. Skin:  Intact without significant lesions or rashes.  No jaundice. Lymph Nodes:  No significant cervical adenopathy. Psych:  Alert and cooperative. Normal mood and affect.  Imaging Studies: DG Chest 2 View  Result Date: 12/01/2020 CLINICAL DATA:  Weakness x3 days. EXAM: CHEST - 2 VIEW COMPARISON:  None. FINDINGS: Very mild atelectasis is seen within the left lung base. There is no evidence of acute infiltrate, pleural effusion or pneumothorax. The heart size and mediastinal contours are within normal limits. There is moderate severity calcification of the aortic arch. The visualized skeletal structures are unremarkable. IMPRESSION: Very mild left basilar atelectasis. Electronically Signed   By: Virgina Norfolk M.D.   On: 12/01/2020 21:29    Assessment and Plan:   Sherry Mosley is a 75 y.o. y/o female who comes in today with a history of diarrhea that she reports to be soft stools but not watery. The patient has had a colonoscopy roughly 3 years ago and has been tried on a milk free diet without any help.  There are no way symptoms such as the diarrhea waking her up from sleep unexplained weight loss, black stools, bloody stools or unexplained weight loss.  The patient has been told that the differential diagnosis includes microscopic colitis with Crohn's disease or ulcerative colitis  being much less likely as well as cancer being less likely.  The patient has also been told that an infection should not last this long therefore an infection is also unlikely.  The patient has elected to try Imodium and titrated to response opposed to undergoing any procedures which may include a colonoscopy at this time.  She has been told that if her symptoms do not improve then she should contact me and at that time we will reconsider doing a colonoscopy.  The patient has been explained the plan and agrees with it.    Lucilla Lame, MD. Marval Regal    Note: This dictation was prepared with Dragon dictation along with smaller phrase technology. Any transcriptional errors that result from this process  are unintentional.

## 2021-01-06 ENCOUNTER — Other Ambulatory Visit: Payer: Self-pay | Admitting: Nurse Practitioner

## 2021-01-06 DIAGNOSIS — C8580 Other specified types of non-Hodgkin lymphoma, unspecified site: Secondary | ICD-10-CM

## 2021-01-06 MED ORDER — TEMAZEPAM 15 MG PO CAPS: ORAL_CAPSULE | ORAL | 1 refills | Status: DC

## 2021-01-08 ENCOUNTER — Other Ambulatory Visit: Payer: Self-pay

## 2021-01-08 ENCOUNTER — Telehealth: Payer: Self-pay | Admitting: *Deleted

## 2021-01-08 DIAGNOSIS — D696 Thrombocytopenia, unspecified: Secondary | ICD-10-CM

## 2021-01-08 NOTE — Telephone Encounter (Signed)
Patient called asking to be referred back to Dr Irene Limbo in [place] stating that she had never wanted to be transferred to [facility], but had to due to needing treatment and [facility] not having availability at that time. She states that they do now have availability and she would like to go back to [place], but was told that we would have to agree to it and refer her back

## 2021-01-09 ENCOUNTER — Telehealth: Payer: Self-pay | Admitting: Hematology

## 2021-01-09 ENCOUNTER — Ambulatory Visit: Payer: Medicare Other | Admitting: Hematology

## 2021-01-09 NOTE — Telephone Encounter (Signed)
Scheduled appt per 11/10 referral. Called pt, no answer. Left msg with appt date and time.

## 2021-01-12 ENCOUNTER — Encounter: Payer: Self-pay | Admitting: Hematology

## 2021-01-12 NOTE — Telephone Encounter (Signed)
Ok to transfer back to Dr. Irene Limbo per Dr. [name]. Looks like she already has and appt with Dr. Irene Limbo on 11/16

## 2021-01-14 ENCOUNTER — Inpatient Hospital Stay: Payer: Medicare Other | Attending: Hematology | Admitting: Hematology

## 2021-01-14 ENCOUNTER — Other Ambulatory Visit: Payer: Self-pay

## 2021-01-14 VITALS — BP 113/60 | HR 107 | Temp 97.2°F | Resp 20 | Wt 143.7 lb

## 2021-01-14 DIAGNOSIS — C8580 Other specified types of non-Hodgkin lymphoma, unspecified site: Secondary | ICD-10-CM | POA: Diagnosis not present

## 2021-01-14 DIAGNOSIS — Z8572 Personal history of non-Hodgkin lymphomas: Secondary | ICD-10-CM | POA: Insufficient documentation

## 2021-01-14 DIAGNOSIS — Z7189 Other specified counseling: Secondary | ICD-10-CM | POA: Diagnosis not present

## 2021-01-15 ENCOUNTER — Telehealth: Payer: Self-pay | Admitting: Hematology

## 2021-01-15 NOTE — Telephone Encounter (Signed)
Scheduled per 11/16 los, message has been left with pt

## 2021-01-19 ENCOUNTER — Other Ambulatory Visit: Payer: Medicare Other

## 2021-01-20 ENCOUNTER — Encounter: Payer: Self-pay | Admitting: Hematology

## 2021-01-26 NOTE — Addendum Note (Signed)
Addended by: Sullivan Lone on: 01/26/2021 05:41 PM   Modules accepted: Orders

## 2021-01-26 NOTE — Progress Notes (Addendum)
HEMATOLOGY/ONCOLOGY CLINIC NOTE  Date of Service: .01/14/2021   Patient Care Team: [name], NP as PCP - General (Geriatric Medicine)  CHIEF COMPLAINTS/PURPOSE OF CONSULTATION:  Non-Hodgkin's Lymphoma  ONCOLOGIC Hx:  B-cell lymphoproliferative disorder, NOS, diagnosed originally by fine needle aspiration of right groin lymph node on 10/25/12  S/p excisional biopsy of right inguinal lymph node on 12/08/12 with finding of B cell lymphoproliferative disorder with indistinct phenotype.  PET and CT scans performed on 12/21/12 consistent with clinical stage disease with adenopathy in both inguinal and femoral areas and left posterior triangle in the neck and probably the left axilla.  Completion of five cycles of CVP-R from 05/05/18 through 08/18/18.   12/08/2012 Surgical pathology of right groin lymph node found: Abnormal Lymphoma FISH result, loss of IGH/14q DNA sequence   HISTORY OF PRESENTING ILLNESS:   Sherry Mosley is a wonderful 75 y.o. female who has been referred to [place] by Dr Seth Bake for evaluation and management of Non-Hodgkin's Lymphoma. The pt reports that she is doing well overall.  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. She has recently moved to [place] on 11/07/2018. She has not had the chance to begin care with a PCP.   The pt reports that she had Pancreatitis in 09/2012 which send her to the ER. Pt also notes that she had a rash that began in 08/2012. Her son, who is also a physician, thought that she had passing viral Pancreatitis due to her symptoms. She then had inguinal lymphadenopathy that was incidentally noted but was not causing problems. She was diagnosed with Non-Hodgkin's Lymphoma in 09/2012 after an excisional biopsy of right inguinal lymph node and a PET/CT Scan. They were following her until 06/2018 when they did a repeat CT scan and found enlarging lymph nodes. Pt denies having any symptoms at that time. They then began  R-CVP in March due to the pt's insistance but she began feeling fatigued and was in bed for >50% of the time. She chose to stop treatment after fifth cycle due to her moving and knowing that she would not be able to do so with the severe fatigue. Her last treatment was in June. She does not currently feel any enlarged lymph nodes but she notes that she doesn't examine herself often. Pt also had some peripheral neuropathy with R-CVP which has since resolved. She denies any transfusion needs or infection issues during treatments. Pt did have some insomnia with Prednisone at 100 mg so she was lowered to 60 mg which was much better. She did not have any cytopenias related to her treatment.  Pt is on Lisinopril 37m for her Essential HTN and Simvastatin 236mfor HLD. Pt has continued to stay up to date with her age-appropriate cancer screenings. Her father was diagnosed with stage 4 Prostate Cancer in his 7050'snd he lived for 11 years after diagnosis. There is no other family history of cancer or blood/immunological disorders. Pt has not been on any other immunosuppressive drugs and has never had any rheumatological issues. Pt has never smoked and does not drink outside of social situations.   Most recent lab results (09/12/2018) of CBC is as follows: all values are WNL except for WBC at 4.7K, ANC at 2612, Hgb at 13.4, PLTs at 202K.  On review of systems, pt denies oral thrush, abdominal pain, change in bowel habits, fever, chills, drenching night sweats, skin rashes, fatigue, numbness/tingling in her fingers and any other symptoms.  On PMHx the pt reports HTN, HLD, Pancreatitis (09/2012). On Social Hx the pt reports she is a non-smoker, doesn't drink outside of social situations. On Family Hx the pt reports a father Dx with Prostate Cancer in his 58's.  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 We are joined  today by her husband. The patient's last visit with me was on 09/06/2020.   Patient transferred care to Dr.Zhou [name] at [facility] since she lives in [place].  She had proceeded with Rituxan as planned for treatment of her symptomatic marginal zone lymphoma with bulky pelvic/right inguinal disease. She unfortunately had fevers rash and chills and concerns for serum sickness-like reaction after her second weekly dose of Rituxan after which Rituxan was discontinued.  The plan was to follow-up with the patient in a couple of months with repeat scans.  Patient chooses to transfer her care back to me.  Patient notes that she is still having significant enlargement of her lymph nodes in her right groin with pressure and discomfort. We discussed the pros and cons of systemic therapy including switching to [facility] based regimen versus ibrutinib versus palliative radiation to the symptomatic right pelvic and right inguinal adenopathy and holding off on systemic therapy at this time.  We decided to get a CT of the abdomen and pelvis to reevaluate the status of her remaining disease.  We discussed that if doing the predominant actionable disease is her bulky disease in the right pelvis and right groin we should refer her to radiation oncology for consideration of palliative radiation to this area and can hold off on systemic therapy at this time. We discussed that the 2 cycles of Rituxan might produce some response as well.  Currently the patient notes no fevers no chills no night sweats.  Still having some intermittent diarrhea for which she follows with gastroenterology in [place].  No black stools no blood in the stools. No other rashes at this time. No abdominal pain or distention. 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 Son is alternate   Would accept resuscitation   No tube feeds if cognitively unaware   Social Determinants of Health   Financial Resource Strain: Not on file  Food Insecurity: Not on file  Transportation Needs: Not on  file  Physical Activity: Not on file  Stress: Not on file  Social Connections: Not on file  Intimate Partner Violence: Not on file    FAMILY HISTORY: Family History  Problem Relation Age of Onset   Alzheimer's disease Mother    Heart failure Father    Prostate cancer Father    Diabetes Maternal Grandfather    Breast cancer Neg Hx     ALLERGIES:  is allergic to cephalosporins and penicillins.  PCN - significant rash  Cephalosporins - significant rash   MEDICATIONS:  Current Outpatient Medications  Medication Sig Dispense Refill   acetaminophen (TYLENOL) 325 MG tablet Take 650 mg by mouth every 6 (six) hours as needed. (Patient not taking: Reported on 12/18/2020)     Ascorbic Acid (VITAMIN C) 1000 MG tablet Take 1 tablet by mouth daily.     Boswellia-Glucosamine-Vit D (OSTEO BI-FLEX ONE PER DAY  PO) Take by mouth.     Cholecalciferol (VITAMIN D3) 50 MCG (2000 UT) capsule Take 1 capsule by mouth daily.     citalopram (CELEXA) 10 MG tablet Take 1 tablet (10 mg total) by mouth daily. 90 tablet 1   lisinopril (ZESTRIL) 5 MG tablet Take 1 tablet (5 mg total) by mouth daily. 90 tablet 1   Loratadine 10 MG CAPS Take 1 tablet by mouth daily.     simvastatin (ZOCOR) 20 MG tablet Take 1 tablet (20 mg total) by mouth daily. 90 tablet 1   temazepam (RESTORIL) 15 MG capsule TAKE 1 CAPSULE BY MOUTH AT BEDTIME AS NEEDED FOR SLEEP. 30 capsule 1   vitamin E 400 UNIT capsule Take 1 capsule by mouth daily.     No current facility-administered medications for this visit.  Lisinopril 17m -  HTN  Simvastatin 284m- HLD  REVIEW OF SYSTEMS:   .10 Point review of Systems was done is negative except as noted above.   PHYSICAL EXAMINATION: ECOG PERFORMANCE STATUS: 1 - Symptomatic but completely ambulatory  Vitals:   01/14/21 1355  BP: 113/60  Pulse: (!) 107  Resp: 20  Temp: (!) 97.2 F (36.2 C)  SpO2: 99%    Filed Weights   01/14/21 1355  Weight: 143 lb 11.2 oz (65.2 kg)    .Body mass index is 25.46 kg/m.    . Marland KitchenENERAL:alert, in no acute distress and comfortable SKIN: no acute rashes, no significant lesions EYES: conjunctiva are pink and non-injected, sclera anicteric OROPHARYNX: MMM, no exudates, no oropharyngeal erythema or ulceration NECK: supple, no JVD LYMPH: Small palpable lymph node in cervical region on the left, bulky right inguinal lymphadenopathy LUNGS: clear to auscultation b/l with normal respiratory effort HEART: regular rate & rhythm ABDOMEN:  normoactive bowel sounds , non tender, not distended. Extremity: no pedal edema PSYCH: alert & oriented x 3 with fluent speech NEURO: no focal motor/sensory deficits   LABORATORY DATA:  I have reviewed the data as listed  . CBC Latest Ref Rng & Units 12/05/2020 12/02/2020 12/01/2020  WBC 4.0 - 10.5 K/uL 6.6 8.9 12.4(H)   Hemoglobin 12.0 - 15.0 g/dL 14.8 13.4 16.5(H)  Hematocrit 36.0 - 46.0 % 44.8 40.0 47.2(H)  Platelets 150 - 400 K/uL 110(L) 44(L) 36(L)    CMP Latest Ref Rng & Units 12/05/2020 12/02/2020 12/01/2020  Glucose 70 - 99 mg/dL 95 89 123(H)  BUN 8 - 23 mg/dL 21 34(H) 37(H)  Creatinine 0.44 - 1.00 mg/dL 0.84 1.02(H) 1.08(H)  Sodium 135 - 145 mmol/L 135 131(L) 131(L)  Potassium 3.5 - 5.1 mmol/L  3.5 3.8 4.3  Chloride 98 - 111 mmol/L 99 99 98  CO2 22 - 32 mmol/L 29 25 23   Calcium 8.9 - 10.3 mg/dL 8.8(L) 7.9(L) 9.0  Total Protein 6.5 - 8.1 g/dL 7.6 6.2(L) 7.6  Total Bilirubin 0.3 - 1.2 mg/dL 0.7 0.7 1.3(H)  Alkaline Phos 38 - 126 U/L 65 64 87  AST 15 - 41 U/L 19 12(L) 18  ALT 0 - 44 U/L 16 18 26    SURGICAL PATHOLOGY  CASE: [id number]  PATIENT: Brandin Hitsman  Surgical Pathology Report   Specimen Submitted:  A. Lymph node, right inguinal   Clinical History: History of low grade B cell lymphoma, now with right  inguinal lymphadenopathy, post US guided BX   DIAGNOSIS:  A. LYMPH NODE, RIGHT INGUINAL; ULTRASOUND-GUIDED BIOPSY:  - LOW-GRADE B-CELL LYMPHOMA WITH FEATURES MOST SUGGESTIVE OF MARGINAL  ZONE LYMPHOMA.   Comment:  Sections demonstrate small to medium sized lymphocytes with monocytoid  features.  Scattered plasma cells are present. Residual normal lymphoid  architecture is not identified.  Material was submitted for flow cytometric analysis which revealed a  monoclonal kappa B cell population that was negative for CD5, CD23, and  CD10 (see scanned report in [facility]).  A panel of immunohistochemical stains was performed with the following  pattern of immunoreactivity:  CD20: Positive in neoplastic lymphocytes  CD3: Highlights background T cells in a distribution similar to CD5  CD5: Highlights background T cells in a distribution similar to CD3  CD23: Highlights focal residual dendritic meshworks  CD10: Negative in neoplastic lymphocytes  BCL-2: Positive in neoplastic lymphocytes   BCL 6: Negative in neoplastic lymphocytes  Cyclin D1: Negative in neoplastic lymphocytes  Sox 11: Negative in neoplastic lymphocytes  Kappa-ISH: Positive in neoplastic lymphocytes  Lambda-ISH: Negative in neoplastic lymphocytes  Ki-67: Approximately 30%  The histologic features in conjunction with the immunophenotypic  findings support the diagnosis of a low-grade mature B-cell lymphoma  most suggestive of marginal zone lymphoma.   Sox 11 IHC and kappa / lambda ISH slides were prepared by [facility] for [employer], [place], [place].  The remaining IHC slides were prepared by [facility] for Molecular  Biology and Pathology, [place], [place]. All controls stained appropriately.   This test was developed and its performance characteristics determined  by [facility]. It has not been cleared or approved by the [place] Food and Drug  Administration. The FDA does not require this test to go through  premarket FDA review. This test is used for clinical purposes. It should  not be regarded as investigational or for research. This laboratory is  certified under the Clinical Laboratory Improvement Amendments (CLIA) as  qualified to perform high complexity clinical laboratory testing.   GROSS DESCRIPTION:  A. Labeled: Right inguinal lymph node  Received: The specimen is received fresh on saline soaked gauze.  Tissue fragment(s): Multiple  Size: Range from 0.5 to 1.9 cm in length and 0.1 cm in diameter  Description: Received are multiple cores and fragments of tan-white soft  tissue.  1 touch prep with Diff-Quik stain is performed.  Representative  sections are submitted for flow cytometry in RPMI.  The remainder of the  specimen is submitted in cassettes 1-4 with 1 core per cassette.    Final Diagnosis performed by Quay Burow, MD.   Electronically signed  08/31/2019 1:38:09PM  The electronic signature indicates that the named Attending Pathologist  has evaluated the  specimen  Technical component performed at [facility], [address], [place],  [place] [address] Lab: [phone] Dir: Rush Farmer, MD, MMM   Professional component performed at [facility], [place], [place], [place] [address] Lab: [phone]  Dir: Dellia Nims. Reuel Derby, MD   RADIOGRAPHIC STUDIES: I have personally reviewed the radiological images as listed and agreed with the findings in the report. No results found.  ASSESSMENT & PLAN:  75 y.o. with   1) Atleast Stage IIIA Low grade B cell Non Hodgkins Lymphoma -consistent with Marginal zone lymphoma S/p R-CVP x  five cycles from 05/05/18 through 08/18/18.  Patient with progression of disease with bulky abdominal pelvic disease with significant right groin bulky adenopathy. Patient was started on weekly Rituxan and received 2 total weekly treatments on 11/21/2020 and 11/28/2020 and developed serum sickness like reaction which is why it was discontinued.  2) Serum sickness-like reaction to Rituxan.  PLAN: -Dr. Shanon Brow has chosen to transfer her oncologic care back to [place]. -At this time we would not recommend proceeding with additional Rituxan on account of her serum sickness like significant reaction to her second weekly dose of Rituxan. -We discussed options of consideration of alternative systemic therapies such as Bendamustine plus Gazyva versus the use of a BTK inhibitor such as Ibrutinib or acalabrutinib versus lenalidomide plus Gazyva. -Alternatively we discussed that since her symptoms are predominantly from bulky right inguinal/pelvic lymphadenopathy we could refer her to radiation oncology for consideration of palliative radiation to this area. -Would recommend getting a CT of the abdomen pelvis to reevaluate the extent of her abdominal pelvic disease to have a baseline prior to consideration of palliative radiation. -Patient prefers to go with palliative radiation at this time and hold off on  additional systemic therapy at this time which is reasonable if there is no other organ threatening disease noted on CT scan. -Continue follow-up with Dr. Lucilla Lame, MD for GI workup for management of diarrhea.   FOLLOW UP: Labs in 5 days CT abd/pelvis in 5 days Referral to radiation oncology for consideration of palliative RT to to inguinal and rt external iliac bulky lymphadenopathy  . The total time spent in the appointment was 30 minutes and more than 50% was on counseling and direct patient cares.   All of the patient's questions were answered with apparent satisfaction. The patient knows to call the clinic with any problems, questions or concerns.   Sullivan Lone MD [place] AAHIVMS [facility] Hematology/Oncology Physician [facility]

## 2021-01-28 ENCOUNTER — Ambulatory Visit (HOSPITAL_BASED_OUTPATIENT_CLINIC_OR_DEPARTMENT_OTHER)
Admission: RE | Admit: 2021-01-28 | Discharge: 2021-01-28 | Disposition: A | Discharge status: Home / Self Care | Payer: Medicare Other | Source: Ambulatory Visit | Attending: Hematology | Admitting: Hematology

## 2021-01-28 ENCOUNTER — Other Ambulatory Visit: Payer: Self-pay

## 2021-01-28 ENCOUNTER — Encounter (HOSPITAL_BASED_OUTPATIENT_CLINIC_OR_DEPARTMENT_OTHER): Payer: Self-pay

## 2021-01-28 DIAGNOSIS — C8580 Other specified types of non-Hodgkin lymphoma, unspecified site: Secondary | ICD-10-CM | POA: Diagnosis not present

## 2021-01-28 DIAGNOSIS — I700 Atherosclerosis of aorta: Secondary | ICD-10-CM | POA: Diagnosis not present

## 2021-01-28 DIAGNOSIS — N1330 Unspecified hydronephrosis: Secondary | ICD-10-CM | POA: Diagnosis not present

## 2021-01-28 LAB — POCT I-STAT CREATININE: Creatinine, Ser: 1 mg/dL (ref 0.44–1.00)

## 2021-01-28 MED ORDER — IOHEXOL 300 MG/ML  SOLN
100.0000 mL | Freq: Once | INTRAMUSCULAR | Status: AC | PRN
  Administered 2021-01-28: 100 mL via INTRAVENOUS

## 2021-02-02 ENCOUNTER — Ambulatory Visit: Payer: Medicare Other | Admitting: Hematology

## 2021-02-03 ENCOUNTER — Other Ambulatory Visit: Payer: Medicare Other

## 2021-02-03 ENCOUNTER — Ambulatory Visit: Payer: Medicare Other | Admitting: Oncology

## 2021-02-03 NOTE — Progress Notes (Signed)
Lymphoma Location(s) / Histology:  Stage IIIA Low grade B cell Non Hodgkins Lymphoma -consistent with Marginal zone lymphoma  CT A/P w/ Contrast 01/28/2021 --IMPRESSION: Slight progression of extensive bulky abdominal and pelvic lymphadenopathy as described above. Persistent splenomegaly. Mild bilateral hydronephrosis likely due to the bulky retroperitoneal lymphadenopathy. Aortic atherosclerosis.  Biopsies revealed:  08/27/2019 DIAGNOSIS:  A. LYMPH NODE, RIGHT INGUINAL; ULTRASOUND-GUIDED BIOPSY:  - LOW-GRADE B-CELL LYMPHOMA WITH FEATURES MOST SUGGESTIVE OF MARGINAL ZONE LYMPHOMA.  Comment:  Sections demonstrate small to medium sized lymphocytes with monocytoid  features.  Scattered plasma cells are present. Residual normal lymphoid  architecture is not identified. Material was submitted for flow cytometric analysis which revealed a monoclonal kappa B cell population that was negative for CD5, CD23, and CD10 (see scanned report in [facility]).  A panel of immunohistochemical stains was performed with the following  pattern of immunoreactivity:  CD20: Positive in neoplastic lymphocytes  CD3: Highlights background T cells in a distribution similar to CD5  CD5: Highlights background T cells in a distribution similar to CD3  CD23: Highlights focal residual dendritic meshworks  CD10: Negative in neoplastic lymphocytes  BCL-2: Positive in neoplastic lymphocytes  BCL 6: Negative in neoplastic lymphocytes  Cyclin D1: Negative in neoplastic lymphocytes  Sox 11: Negative in neoplastic lymphocytes  Kappa-ISH: Positive in neoplastic lymphocytes  Lambda-ISH: Negative in neoplastic lymphocytes  Ki-67: Approximately 30%  The histologic features in conjunction with the immunophenotypic findings support the diagnosis of a low-grade mature B-cell lymphoma most suggestive of marginal zone lymphoma.   Past/Anticipated interventions by medical oncology, if any:  Under care of Dr. Sullivan Lone 01/14/2021 PLAN:  At this time we would not recommend proceeding with additional Rituxan on account of her serum sickness like significant reaction to her second weekly dose of Rituxan. We discussed options of consideration of alternative systemic therapies such as Bendamustine plus Gazyva versus the use of a BTK inhibitor such as Ibrutinib or acalabrutinib versus lenalidomide plus Gazyva. Alternatively we discussed that since her symptoms are predominantly from bulky right inguinal/pelvic lymphadenopathy we could refer her to radiation oncology for consideration of palliative radiation to this area. Would recommend getting a CT of the abdomen pelvis to reevaluate the extent of her abdominal pelvic disease to have a baseline prior to consideration of palliative radiation. Patient prefers to go with palliative radiation at this time and hold off on additional systemic therapy at this time which is reasonable if there is no other organ threatening disease noted on CT scan. Continue follow-up with Dr. Lucilla Lame, MD for GI workup for management of diarrhea FOLLOW UP: Labs in 5 days CT abd/pelvis in 5 days Referral to radiation oncology for consideration of palliative RT to to inguinal and rt external iliac bulky lymphadenopathy  Weight changes, if any, over the past 6 months: Patient denies  Recurrent fevers, or drenching night sweats, if any: Patient denies  SAFETY ISSUES: Prior radiation? No Pacemaker/ICD? No Possible current pregnancy? No--hysterectomy Is the patient on methotrexate? No  Current Complaints / other details:  Nothing else of note

## 2021-02-03 NOTE — Progress Notes (Signed)
Radiation Oncology         [phone] ________________________________  Initial Outpatient Consultation by telephone.  The patient opted for telemedicine to maximize safety during the pandemic.  MyChart video was not obtainable.   Name: Sherry Mosley MRN: [id number]  Date: 02/04/2021  DOB: Jul 08, 1945  [id number], Sherry American, NP  Sherry Genera, MD   REFERRING PHYSICIAN: Brunetta Genera, MD  DIAGNOSIS:    ICD-10-CM   1. 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 On 12/08/2012, the patient underwent a biopsy of the right inguinal lymph node which revealed abnormal lymphoma with loss of IGH/14q DNA sequence. CT of the chest abdomen and pelvis on 05/01/2018 showed progression of LAD in the abdomen and pelvis concerning for progression of disease. Subsequently, the patient began R-CVP in March of 2020 though ceased treatment after the 5th cycle in June due to significant fatigue and neuropathy. She had a mild response with improved adenopathy but no resolution on interim imaging in May 2020. 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 The patient was followed in [place] until 06/2018.  Accordingly, the patient established care with Dr. Irene Limbo on 12/12/2018 once she settled in [place]. Given that the patient was asymptomatic, Dr. Irene Limbo advised the patient to remain on observation  with monitoring labs and routine follow-ups.   Restaging PET ordered by Dr. Irene Limbo on 12/29/2018 revealed low left cervical and right pelvic hypermetabolic adenopathy, consistent with active lymphoma.   CT of the chest abdomen and pelvis on 08/06/2019 revealed a mild increase in abdominal retroperitoneal and right pelvic lymphadenopathy. No evidence of recurrent lymphoma within the chest was appreciated.   Given findings on PET, Dr. Irene Limbo recommended repeat lymph node biopsy to confirm lymphoma presence in the cervical and inguinal areas.   Biopsy of the right inguinal lymph node on 08/27/2019 revealed low grade b-cell lymphoma, with features most suggestive of marginal zone lymphoma.   Restaging PET on 09/15/2020 showed progression of left cervical lymphadenopathy since PET-CT on 12/29/2018. New mild retrocrural lymphadenopathy was also appreciated in the chest since prior studies from 08/06/2019 and 12/29/2018. Lastly, clear progression of abdominopelvic lymphadenopathy was visualized since CT on 08/06/2019, demonstrating [place] 4 level uptake.  Given progression of disease, the patient consulted with Dr. [name] [facility]) on 11/05/20 ( to be closer to home) who recommended single agent rituximab weekly x4. Patient received her first infusion on 11/21/20.   Shortly after beginning treatment, the patient presented to the ED on 12/01/20 with generalized weakness for 3 days. The patient was evaluated in triage and left without further evaluation due to a long wait time. The following day, the patient presented to Dr. [name] with acute onset of fever, rash, fatigue, loss of appetite, and weakness beginning the day after her first infusion. Following evaluation, Dr. [name] suspected that the patient developed acute serum sickness due to Rituximab. Accordingly, the patient was prescribed dexamethasone and medrol dose  pack, and instructed to take benadryl for itching and rash.  During follow-up with Dr.  [name] on 12/05/20, the patient reported resolution of her symptoms other than loss of appetite.   The patient opted to transition care back to Dr. Irene Limbo on 01/14/21. During this visit, the patient reported that she is still having significant enlargement of her lymph nodes in her right groin with pressure and discomfort. Dr. Irene Limbo recommended further imaging to to reevaluate the status of her remaining disease.  CT of the abdomen and pelvis on 01/28/21 demonstrated slight progression of extensive bulky abdominal and pelvic lymphadenopathy. Mild bilateral hydronephrosis was also appreciated, likely due to the bulky retroperitoneal lymphadenopathy.  Of note: Bilateral screening mammogram on 06/06/2019 revealed a possible asymmetry in the right breast. Diagnostic right breast mammogram on 07/05/2019 revealed no mammographic evidence of malignancy. Bilateral screening mammogram on 12/26/20 showed no mammographic evidence of malignancy.    Past/Anticipated interventions by medical oncology, if any:  Under care of Dr. Sullivan Lone 01/14/2021 PLAN:  At this time we would not recommend proceeding with additional Rituxan on account of her serum sickness like significant reaction to her second weekly dose of Rituxan. We discussed options of consideration of alternative systemic therapies such as Bendamustine plus Gazyva versus the use of a BTK inhibitor such as Ibrutinib or acalabrutinib versus lenalidomide plus Gazyva. Alternatively we discussed that since her symptoms are predominantly from bulky right inguinal/pelvic lymphadenopathy we could refer her to radiation oncology for consideration of palliative radiation to this area. Would recommend getting a CT of the abdomen pelvis to reevaluate the extent of her abdominal pelvic disease to have a baseline prior to consideration of palliative radiation. Patient prefers to go with palliative radiation at this time and hold off on additional systemic therapy at this  time which is reasonable if there is no other organ threatening disease noted on CT scan. Continue follow-up with Dr. Lucilla Lame, MD for GI workup for management of diarrhea FOLLOW UP: Labs in 5 days CT abd/pelvis in 5 days Referral to radiation oncology for consideration of palliative RT to to inguinal and rt external iliac bulky lymphadenopathy  Weight changes, if any, over the past 6 months: Patient denies  Recurrent fevers, or drenching night sweats, if any: Patient denies  SAFETY ISSUES: Prior radiation? No Pacemaker/ICD? No Possible current pregnancy? No--hysterectomy Is the patient on methotrexate? 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    PAST SURGICAL HISTORY: Past Surgical History:  Procedure Laterality Date   ABDOMINAL HYSTERECTOMY  1997   ANKLE ARTHRODESIS Right 2012   BREAST BIOPSY Left 1969   fibroadenoma   CATARACT EXTRACTION W/ INTRAOCULAR LENS IMPLANT Bilateral    LYMPH NODE BIOPSY  2014   excisional biopsy right groin   Thumb surgery     removal of part of extensor tendon    FAMILY HISTORY: family history includes Alzheimer's disease in her mother; Diabetes in her maternal grandfather; Heart failure in her father; Prostate cancer in her father. father was diagnosed with stage 4 Prostate Cancer in his 66's and he lived for 11 years after diagnosis  SOCIAL HISTORY:  reports that she has never smoked. She has never used  smokeless tobacco. She reports that she does not currently use alcohol. She reports that she does not use drugs.  ALLERGIES: Cephalosporins and Penicillins  MEDICATIONS:  Current Outpatient Medications  Medication  Sig Dispense Refill   acetaminophen (TYLENOL) 325 MG tablet Take 650 mg by mouth every 6 (six) hours as needed. (Patient not taking: Reported on 12/18/2020)     Ascorbic Acid (VITAMIN C) 1000 MG tablet Take 1 tablet by mouth daily.     Boswellia-Glucosamine-Vit D (OSTEO BI-FLEX ONE PER DAY PO) Take by mouth.     Cholecalciferol (VITAMIN D3) 50 MCG (2000 UT) capsule Take 1 capsule by mouth daily.     citalopram (CELEXA) 10 MG tablet Take 1 tablet (10 mg total) by mouth daily. 90 tablet 1   lisinopril (ZESTRIL) 5 MG tablet Take 1 tablet (5 mg total) by mouth daily. 90 tablet 1   Loratadine 10 MG CAPS Take 1 tablet by mouth daily.     simvastatin (ZOCOR) 20 MG tablet Take 1 tablet (20 mg total) by mouth daily. 90 tablet 1   temazepam (RESTORIL) 15 MG capsule TAKE 1 CAPSULE BY MOUTH AT BEDTIME AS NEEDED FOR SLEEP. 30 capsule 1   vitamin E 400 UNIT capsule Take 1 capsule by mouth daily.     No current facility-administered medications for this encounter.    REVIEW OF SYSTEMS:  Notable for that above.   PHYSICAL EXAM:  vitals were not taken for this visit.   General: Alert and oriented, in no acute distress     LABORATORY DATA:  Lab Results  Component Value Date   WBC 6.6 12/05/2020   HGB 14.8 12/05/2020   HCT 44.8 12/05/2020   MCV 91.2 12/05/2020   PLT 110 (L) 12/05/2020   CMP     Component Value Date/Time   NA 135 12/05/2020 0815   K 3.5 12/05/2020 0815   CL 99 12/05/2020 0815   CO2 29 12/05/2020 0815   GLUCOSE 95 12/05/2020 0815   BUN 21 12/05/2020 0815   CREATININE 1.00 01/28/2021 1616   CREATININE 0.99 09/02/2020 1255   CALCIUM 8.8 (L) 12/05/2020 0815   PROT 7.6 12/05/2020 0815   ALBUMIN 2.9 (L) 12/05/2020 0815   AST 19 12/05/2020 0815   AST 26 09/02/2020 1255   ALT 16 12/05/2020 0815   ALT 18 09/02/2020 1255   ALKPHOS 65 12/05/2020 0815   BILITOT 0.7 12/05/2020 0815   BILITOT 0.5 09/02/2020 1255   GFRNONAA >60 12/05/2020 0815   GFRNONAA 59 (L) 09/02/2020 1255    GFRAA >60 08/06/2019 1056         RADIOGRAPHY: CT Abdomen Pelvis W Contrast  Result Date: 01/28/2021 CLINICAL DATA:  Marginal zone lymphoma with enlarging groin mass. EXAM: CT ABDOMEN AND PELVIS WITH CONTRAST TECHNIQUE: Multidetector CT imaging of the abdomen and pelvis was performed using the standard protocol following bolus administration of intravenous contrast. CONTRAST:  11m OMNIPAQUE IOHEXOL 300 MG/ML  SOLN COMPARISON:  CT scan 08/06/2019 and PET-CT 09/15/2020 FINDINGS: Lower chest: The lung bases are clear of an acute process. Patchy areas of subsegmental atelectasis. The heart is normal in size. No pericardial effusion. Enlarging retrocrural lymph nodes are noted. 11.5 mm node between the esophagus and aorta on image 9/2 measures 11.5 mm and previously measured 8 mm. Right paravertebral node on image 10/2 measures 11 mm and previously measured 10 mm. Right para-aortic node on image 15/2 measures 15.5 mm and previously measured 10 mm. Hepatobiliary: No hepatic lesions or intrahepatic biliary dilatation.  Stable right hepatic lobe cyst. The gallbladder is unremarkable. No common bile duct dilatation. Pancreas: No mass, inflammation or ductal dilatation. Spleen: Persistent splenomegaly. The spleen measures 16.5 x 13.8 x 8.8 cm. No focal splenic lesions. Adrenals/Urinary Tract: The adrenal glands and kidneys are stable. No worrisome renal lesions. Mild bilateral hydronephrosis likely due to the bulky retroperitoneal lymphadenopathy. The bladder is unremarkable. Stomach/Bowel: The stomach, duodenum, small bowel and colon are unremarkable. The bowel is displaced in some areas due to bulky adenopathy but no mass lesions or obstructive findings. Vascular/Lymphatic: The aorta demonstrates stable scattered atherosclerotic calcifications. No aneurysm or dissection. The major venous structures are patent. There is mass effect on the IVC by portal adenopathy. Extensive bulky abdominal and pelvic  lymphadenopathy. This includes gastrohepatic ligament, celiac axis, periportal, mesenteric and retroperitoneal adenopathy in the abdomen and extensive right-sided pelvic and inguinal adenopathy Portal lymph node with mass effect on the IVC has a maximum short axis diameter of 22 mm. This was 11 mm on the prior PET-CT. Retroperitoneal nodal disease measures a maximum of 7.5 x 6.0 cm on image 37/2. This previously measured 7.2 x 4.5 cm. Bulky right-sided pelvic adenopathy. The largest node in the external iliac area has a maximum short axis diameter of 5.3 cm and was previously 5.2 cm. Large right inguinal nodal mass measures 8.3 x 7.5 cm and previously measured 8.3 x 6.8 cm. Reproductive: Surgically absent. Other: No free pelvic fluid collections. Musculoskeletal: No significant bony findings. IMPRESSION: 1. Slight progression of extensive bulky abdominal and pelvic lymphadenopathy as described above. 2. Persistent splenomegaly. 3. Mild bilateral hydronephrosis likely due to the bulky retroperitoneal lymphadenopathy. 4. Aortic atherosclerosis. Aortic Atherosclerosis (ICD10-I70.0). Electronically Signed   By: Marijo Sanes M.D.   On: 01/28/2021 16:46      IMPRESSION/PLAN: Progressive stage III marginal zone lymphoma with bulky adenopathy in the abdomen and pelvis  Today, I talked to the patient about the findings and work-up thus far.  I have personally reviewed her images and I shared the results with her over the phone. We discussed the patient's diagnosis and general treatment for this, highlighting the role of palliative radiotherapy in the management.  We discussed the available radiation techniques, and focused on the details of logistics and delivery.     We discussed the risks, benefits, and side effects of radiotherapy.  I believe we have a good chance that palliating her symptoms (and improving her hydronephrosis) if we treat the bulky adenopathy in the retroperitoneum and pelvis.  Side effects may  include but not necessarily be limited to: Skin irritation, fatigue, nausea, diarrhea; no guarantees of treatment were given. A consent form will be reviewed and signed when I see her in person for treatment planning.  She is enthusiastic about proceeding. The patient was encouraged to ask questions that I answered to the best of my ability.  Tentatively we will plan to pursue a course of 20 Gray in 10 fractions although I did acknowledge that sometimes this type of lymphoma is highly radiosensitive and can shrink down substantially to lower doses.  We discussed the potential advantage of giving [place] for durable local control.  We will finalize her dose and fractionation when I see her in person and she is can certainly get a 1 week regimen of radiation if that is significantly easier for her.   This encounter was provided by telemedicine platform; patient desired telemedicine during pandemic precautions.  MyChart video was not available and therefore telephone was used. The patient  has given verbal consent for this type of encounter and has been advised to only accept a meeting of this type in a secure network environment. On date of service, in total, I spent 55 minutes on this encounter.   The attendants for this meeting include Eppie Gibson  and [name] During the encounter, Eppie Gibson was located at [facility] Radiation Oncology Department.  Avynn Klassen was located at home.     __________________________________________   Eppie Gibson, MD  This document serves as a record of services personally performed by Eppie Gibson, MD. It was created on her behalf by Roney Mans, a trained medical scribe. The creation of this record is based on the scribe's personal observations and the provider's statements to them. This document has been checked and approved by the attending provider.

## 2021-02-04 ENCOUNTER — Ambulatory Visit
Admission: RE | Admit: 2021-02-04 | Discharge: 2021-02-04 | Disposition: A | Discharge status: Home / Self Care | Payer: Medicare Other | Source: Ambulatory Visit | Attending: Radiation Oncology | Admitting: Radiation Oncology

## 2021-02-04 ENCOUNTER — Encounter: Payer: Self-pay | Admitting: Radiation Oncology

## 2021-02-04 DIAGNOSIS — C8580 Other specified types of non-Hodgkin lymphoma, unspecified site: Secondary | ICD-10-CM

## 2021-02-06 ENCOUNTER — Other Ambulatory Visit: Payer: Self-pay | Admitting: Hematology

## 2021-03-03 ENCOUNTER — Ambulatory Visit: Payer: Medicare Other | Admitting: Radiation Oncology

## 2021-03-03 ENCOUNTER — Encounter: Payer: Self-pay | Admitting: Radiation Oncology

## 2021-03-03 NOTE — Progress Notes (Signed)
Radiation appts are getting cancelled due to change of plans per Dr. Irene Limbo, who will start systemic therapy due to burden of disease on recent imaging. Dr Irene Limbo has discussed plan with Dr. Shanon Brow. -----------------------------------  Eppie Gibson, MD

## 2021-03-05 ENCOUNTER — Telehealth: Payer: Self-pay | Admitting: Hematology

## 2021-03-05 NOTE — Telephone Encounter (Signed)
Scheduled per 01/05 scheduled message, patient has been called and notified. °

## 2021-03-06 ENCOUNTER — Inpatient Hospital Stay: Payer: Medicare Other | Attending: Hematology | Admitting: Hematology

## 2021-03-06 ENCOUNTER — Other Ambulatory Visit: Payer: Self-pay

## 2021-03-06 DIAGNOSIS — Z7189 Other specified counseling: Secondary | ICD-10-CM

## 2021-03-06 DIAGNOSIS — C8580 Other specified types of non-Hodgkin lymphoma, unspecified site: Secondary | ICD-10-CM

## 2021-03-06 DIAGNOSIS — Z9221 Personal history of antineoplastic chemotherapy: Secondary | ICD-10-CM | POA: Insufficient documentation

## 2021-03-06 DIAGNOSIS — Z8572 Personal history of non-Hodgkin lymphomas: Secondary | ICD-10-CM | POA: Insufficient documentation

## 2021-03-08 ENCOUNTER — Encounter: Payer: Self-pay | Admitting: Hematology

## 2021-03-08 MED ORDER — IBRUTINIB 140 MG PO CAPS
420.0000 mg | ORAL_CAPSULE | Freq: Every day | ORAL | 0 refills | Status: DC
  Filled 2021-03-08: qty 90, 30d supply, fill #0

## 2021-03-08 NOTE — Progress Notes (Signed)
HEMATOLOGY/ONCOLOGY CLINIC NOTE  Date of Service: .03/06/2021   Patient Care Team: Sherry Chandler, NP as PCP - General (Geriatric Medicine)  CHIEF COMPLAINTS/PURPOSE OF CONSULTATION:  Relapsed marginal zone lymphoma  ONCOLOGIC Hx:  B-cell lymphoproliferative disorder, NOS, diagnosed originally by fine needle aspiration of right groin lymph node on 10/25/12  S/p excisional biopsy of right inguinal lymph node on 12/08/12 with finding of B cell lymphoproliferative disorder with indistinct phenotype.  PET and CT scans performed on 12/21/12 consistent with clinical stage disease with adenopathy in both inguinal and femoral areas and left posterior triangle in the neck and probably the left axilla.  Completion of five cycles of CVP-R from 05/05/18 through 08/18/18.   12/08/2012 Surgical pathology of right groin lymph node found: Abnormal Lymphoma FISH result, loss of IGH/14q DNA sequence   HISTORY OF PRESENTING ILLNESS:   Sherry Mosley is a wonderful 76 y.o. female who has been referred to [place] by Dr Sherry Mosley for evaluation and management of Non-Hodgkin's Lymphoma. The pt reports that she is doing well overall.  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 She has recently moved to [place] on 11/07/2018. She has not had the chance to begin care with a PCP.   The pt reports that she had Pancreatitis in 09/2012 which send her to the ER. Pt also notes that she had a rash that began in 08/2012. Her son, who is also a physician, thought that she had passing viral Pancreatitis due to her symptoms. She then had inguinal lymphadenopathy that was incidentally noted but was not causing problems. She was diagnosed with Non-Hodgkin's Lymphoma in 09/2012 after an excisional biopsy of right inguinal lymph node and a PET/CT Scan. They were following her until 06/2018 when they did a repeat CT scan and found enlarging lymph nodes. Pt denies having any symptoms at that time. They  then began R-CVP in March due to the pt's insistance but she began feeling fatigued and was in bed for >50% of the time. She chose to stop treatment after fifth cycle due to her moving and knowing that she would not be able to do so with the severe fatigue. Her last treatment was in June. She does not currently feel any enlarged lymph nodes but she notes that she doesn't examine herself often. Pt also had some peripheral neuropathy with R-CVP which has since resolved. She denies any transfusion needs or infection issues during treatments. Pt did have some insomnia with Prednisone at 100 mg so she was lowered to 60 mg which was much better. She did not have any cytopenias related to her treatment.  Pt is on Lisinopril 39m for her Essential HTN and Simvastatin 272mfor HLD. Pt has continued to stay up to date with her age-appropriate cancer screenings. Her father was diagnosed with stage 4 Prostate Cancer in his 701'snd he lived for 11 years after diagnosis. There is no other family history of cancer or blood/immunological disorders. Pt has not been on any other immunosuppressive drugs and has never had any rheumatological issues. Pt has never smoked and does not drink outside of social situations.   Most recent lab results (09/12/2018) of CBC is as follows: all values are WNL except for WBC at 4.7K, ANC at 2612, Hgb at 13.4, PLTs at 202K.  On review of systems, pt denies oral thrush, abdominal pain, change in bowel habits, fever, chills, drenching night sweats, skin rashes, fatigue, numbness/tingling in her fingers and any other symptoms.  On PMHx the pt reports HTN, HLD, Pancreatitis (09/2012). On Social Hx the pt reports she is a non-smoker, doesn't drink outside of social situations. On Family Hx the pt reports a father Dx with Prostate Cancer in his 107's.  INTERVAL HISTORY:   .I connected with Sherry Mosley on 03/08/21 at 12:00 PM EST by telephone visit and verified that I am speaking with the  correct person using two identifiers.   I discussed the limitations, risks, security and privacy concerns of performing an evaluation and management service by telemedicine and the availability of in-person appointments. I also discussed with the patient that there may be a patient responsible charge related to this service. The patient expressed understanding and agreed to proceed.   Other persons participating in the visit and their role in the encounter: None  Patients location: Home Providers location: [place]  Chief Complaint: Follow-up for continued evaluation and management of marginal zone lymphoma.  I called Dr. Salome Mosley to follow-up on her previous discussion last month on next line of treatment for her marginal zone lymphoma. Just before the Christmas holidays we had discussed her CT abdomen and pelvis with contrast which showed slight progression of her extensive bulky abdominal and pelvic lymphadenopathy and persistent splenomegaly.  Starting to develop some mild bilateral hydronephrosis due to bulky retroperitoneal lymphadenopathy but with no change in her creatinine or ureteric colic.  Her second line Rituxan monotherapy was discontinued due to a serum sickness-like reaction with her second weekly dose of Rituxan.  We had discussed third line treatment options with Bendamustine/Gazyva versus oral ibrutinib.  We discussed pros and cons of each treatment approach and she had initially decided to proceed with Bendamustine and Gazyva which we were in the process of setting up to start treatment in January after her vacation as per her wishes. During her vacation time she had discussion with her son who is an ER physician and other colleagues and notes that she prefers to take a milder approach to treatment and prefers to go with an oral treatment regimen. We discussed in detail the pros and cons of oral ibrutinib and she prefers to start this as her third line  treatment option and hold off on Bendamustine and Gazyva. She also prefers to hold off on any consideration of palliative radiation at this time and wants to see how she responds to the ibrutinib.  No fevers no chills no new focal symptoms since our last phone discussion in December 2022. No new leg swelling. No new fatigue. No other acute new symptoms.  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 Son is alternate   Would accept resuscitation   No tube feeds if cognitively unaware   Social Determinants of Health   Financial Resource Strain: Not on file  Food Insecurity: Not on file  Transportation Needs: Not on file  Physical Activity: Not on file  Stress: Not on file  Social Connections: Not on file  Intimate Partner Violence: Not on file    FAMILY HISTORY: Family History  Problem Relation Age of Onset   Alzheimer's disease Mother    Heart failure Father    Prostate cancer Father     Diabetes Maternal Grandfather    Breast cancer Neg Hx     ALLERGIES:  is allergic to cephalosporins and penicillins.  PCN - significant rash  Cephalosporins - significant rash   MEDICATIONS:  Current Outpatient Medications  Medication Sig Dispense Refill   ibrutinib (IMBRUVICA) 140 MG capsul Take 3 capsules (420 mg total) by mouth daily. 90 capsule 0   acetaminophen (TYLENOL) 325 MG tablet Take 650 mg by mouth every 6 (six) hours as needed. (Patient not taking: Reported on 12/18/2020)     Ascorbic Acid (VITAMIN C) 1000 MG tablet Take 1 tablet by mouth daily.     Boswellia-Glucosamine-Vit D (OSTEO BI-FLEX ONE PER DAY PO) Take by mouth.     Cholecalciferol (VITAMIN D3) 50 MCG (2000 UT) capsule Take 1 capsule by mouth daily.     citalopram (CELEXA) 10 MG tablet Take 1 tablet (10 mg total) by mouth daily. 90 tablet 1   lisinopril (ZESTRIL) 5 MG tablet Take 1 tablet (5 mg total) by mouth daily. 90 tablet 1   Loratadine 10 MG CAPS Take 1 tablet by mouth daily.     simvastatin (ZOCOR) 20 MG tablet Take 1 tablet (20 mg total) by mouth daily. 90 tablet 1   temazepam (RESTORIL) 15 MG capsule TAKE 1 CAPSULE BY MOUTH AT BEDTIME AS NEEDED FOR SLEEP. 30 capsule 1   vitamin E 400 UNIT capsule Take 1 capsule by mouth daily.     No current facility-administered medications for this visit.  Lisinopril 52m -  HTN  Simvastatin 258m- HLD  REVIEW OF SYSTEMS:  .10 Point review of Systems was done is negative except as noted above.    PHYSICAL EXAMINATION: Telemedicine visit  LABORATORY DATA:  I have reviewed the data as listed  . CBC Latest Ref Rng & Units 12/05/2020 12/02/2020 12/01/2020  WBC 4.0 - 10.5 K/uL 6.6 8.9 12.4(H)  Hemoglobin 12.0 - 15.0 g/dL 14.8 13.4 16.5(H)  Hematocrit 36.0 - 46.0 % 44.8 40.0 47.2(H)  Platelets 150 - 400 K/uL 110(L) 44(L) 36(L)    CMP Latest Ref Rng & Units 01/28/2021 12/05/2020 12/02/2020  Glucose 70 - 99 mg/dL - 95 89  BUN 8 - 23 mg/dL - 21 34(H)  Creatinine  0.44 - 1.00 mg/dL 1.00 0.84 1.02(H)  Sodium 135 - 145 mmol/L - 135 131(L)  Potassium 3.5 - 5.1 mmol/L - 3.5 3.8  Chloride 98 - 111 mmol/L - 99 99  CO2 22 - 32 mmol/L - 29 25  Calcium 8.9 - 10.3 mg/dL - 8.8(L) 7.9(L)  Total Protein 6.5 - 8.1 g/dL - 7.6 6.2(L)  Total Bilirubin 0.3 - 1.2 mg/dL - 0.7 0.7  Alkaline Phos 38 - 126 U/L - 65 64  AST 15 -  41 U/L - 19 12(L)  ALT 0 - 44 U/L - 16 18   SURGICAL PATHOLOGY  CASE: [id number]  PATIENT: Sherry Mosley  Surgical Pathology Report   Specimen Submitted:  A. Lymph node, right inguinal   Clinical History: History of low grade B cell lymphoma, now with right  inguinal lymphadenopathy, post US guided BX   DIAGNOSIS:  A. LYMPH NODE, RIGHT INGUINAL; ULTRASOUND-GUIDED BIOPSY:  - LOW-GRADE B-CELL LYMPHOMA WITH FEATURES MOST SUGGESTIVE OF MARGINAL  ZONE LYMPHOMA.   Comment:  Sections demonstrate small to medium sized lymphocytes with monocytoid  features.  Scattered plasma cells are present. Residual normal lymphoid  architecture is not identified.  Material was submitted for flow cytometric analysis which revealed a  monoclonal kappa B cell population that was negative for CD5, CD23, and  CD10 (see scanned report in [facility]).  A panel of immunohistochemical stains was performed with the following  pattern of immunoreactivity:  CD20: Positive in neoplastic lymphocytes  CD3: Highlights background T cells in a distribution similar to CD5  CD5: Highlights background T cells in a distribution similar to CD3  CD23: Highlights focal residual dendritic meshworks  CD10: Negative in neoplastic lymphocytes  BCL-2: Positive in neoplastic lymphocytes  BCL 6: Negative in neoplastic lymphocytes  Cyclin D1: Negative in neoplastic lymphocytes  Sox 11: Negative in neoplastic lymphocytes  Kappa-ISH: Positive in neoplastic lymphocytes  Lambda-ISH: Negative in neoplastic lymphocytes  Ki-67: Approximately 30%  The histologic features in conjunction with  the immunophenotypic  findings support the diagnosis of a low-grade mature B-cell lymphoma  most suggestive of marginal zone lymphoma.   Sox 11 IHC and kappa / lambda ISH slides were prepared by [place] for [employer], [place], [place].  The remaining IHC slides were prepared by [facility] for Molecular  Biology and Pathology, [place], [place]. All controls stained appropriately.   This test was developed and its performance characteristics determined  by [facility]. It has not been cleared or approved by the [place] Food and Drug  Administration. The FDA does not require this test to go through  premarket FDA review. This test is used for clinical purposes. It should  not be regarded as investigational or for research. This laboratory is  certified under the Clinical Laboratory Improvement Amendments (CLIA) as  qualified to perform high complexity clinical laboratory testing.   GROSS DESCRIPTION:  A. Labeled: Right inguinal lymph node  Received: The specimen is received fresh on saline soaked gauze.  Tissue fragment(s): Multiple  Size: Range from 0.5 to 1.9 cm in length and 0.1 cm in diameter  Description: Received are multiple cores and fragments of tan-white soft  tissue.  1 touch prep with Diff-Quik stain is performed.  Representative  sections are submitted for flow cytometry in RPMI.  The remainder of the  specimen is submitted in cassettes 1-4 with 1 core per cassette.    Final Diagnosis performed by Quay Burow, MD.   Electronically signed  08/31/2019 1:38:09PM  The electronic signature indicates that the named Attending Pathologist  has evaluated the specimen  Technical component performed at [facility], [address], [place],  [place] [address] Lab: [phone] Dir: Rush Farmer, MD, MMM   Professional component performed at [facility], [facility], [place], [place], [place] [address] Lab: [phone]  Dir: Dellia Nims. Reuel Derby, MD    RADIOGRAPHIC STUDIES: I have personally reviewed the radiological images as listed and agreed with the findings in the report. No results found.  ASSESSMENT &  PLAN:  76 y.o. with   1) Atleast Stage IIIA Low grade B cell Non Hodgkins Lymphoma -consistent with Marginal zone lymphoma S/p R-CVP x  five cycles from 05/05/18 through 08/18/18.  Patient with progression of disease with bulky abdominal pelvic disease with significant right groin bulky adenopathy. Patient was started on weekly Rituxan and received 2 total weekly treatments on 11/21/2020 and 11/28/2020 and developed serum sickness like reaction which is why it was discontinued.  2) Serum sickness-like reaction to Rituxan. (2nd weekly dose)  PLAN: -I had discussed her CT abdomen pelvis results from 01/28/2021 with Dr. Shanon Mosley on the phone prior to the holidays in December and considering the bulky lymphadenopathy and early bilateral hydronephrosis we had decided to pursue Bendamustine/Gazyva chemotherapy after the holidays starting in January 2023.  We had also discussed option for oral treatment with ibrutinib/BTK inhibitors. -She has also seen Dr. Eppie Gibson and was offered the option of palliative radiation which was held when she had opted for combination Bendamustine and Gazyva chemoimmunotherapy. -Over the holidays Dr. Shanon Mosley talked to other colleagues and her son who is also physician and changed her mind and would like to pursue treatment with oral Ibrutinib instead and see how each she responds prior to deciding on palliative radiation or other more aggressive treatments. -She currently has no focal new symptoms or constitutional symptoms and has been feeling well.  Good p.o. intake and energy levels. -We discussed the potential adverse effects of ibrutinib including risk of bleeding, atrial fibrillation, hypertension etc. -The standard dose with marginal zone lymphoma would be 560 mg p.o. daily but given age and previous treatment  tolerances we shall start at Ibrutinib 420 mg p.o. daily and then progressed to 560 mg p.o. daily if she tolerates it well for the first month. -She will need to come in for labs prior to starting the Ibrutinib. -I have messaged our oral chemotherapy pharmacist Leron Croak to follow-up with her regarding additional medication counseling and medication authorization.  FOLLOW UP: Patient to come in for labs within a week Will need to start ibrutinib after labs as long as labs are unchanged. MD visit with Dr. Irene Limbo 2 weeks after starting p.o. ibrutinib for toxicity check.  . The total time spent in the appointment was 32 minutes again reviewing her CT scan on the phone, discussion of treatment options and the pros and cons on each treatment option, role of radiation therapy, details about ibrutinib treatment and goals of treatment, ordering ibrutinib and coordination of care.    All of the patient's questions were answered with apparent satisfaction. The patient knows to call the clinic with any problems, questions or concerns.   Sullivan Lone MD [place] AAHIVMS [facility] Hematology/Oncology Physician [facility]

## 2021-03-09 ENCOUNTER — Encounter: Payer: Self-pay | Admitting: Hematology

## 2021-03-09 ENCOUNTER — Telehealth: Payer: Self-pay | Admitting: Hematology

## 2021-03-09 ENCOUNTER — Other Ambulatory Visit (HOSPITAL_COMMUNITY): Payer: Self-pay

## 2021-03-09 ENCOUNTER — Telehealth: Payer: Self-pay

## 2021-03-09 ENCOUNTER — Ambulatory Visit: Payer: Medicare Other | Admitting: Radiation Oncology

## 2021-03-09 ENCOUNTER — Telehealth: Payer: Self-pay | Admitting: Pharmacist

## 2021-03-09 DIAGNOSIS — C8580 Other specified types of non-Hodgkin lymphoma, unspecified site: Secondary | ICD-10-CM

## 2021-03-09 NOTE — Telephone Encounter (Signed)
Left message with follow-up appointments per 1/6 los. °

## 2021-03-09 NOTE — Telephone Encounter (Signed)
Oral Oncology Patient Advocate Encounter  Prior Authorization for Sherry Mosley has been approved.    PA# [id number]  Effective dates: 03/09/21 through 02/28/22  Patients co-pay is $3447  Oral Oncology Clinic will continue to follow.   [place] Patient [name] Phone [phone] Fax [phone] 03/09/2021 11:11 AM

## 2021-03-09 NOTE — Telephone Encounter (Signed)
Oral Oncology Patient Advocate Encounter   Received notification from [employer] that prior authorization for Imbruvica is required.   PA submitted on CoverMyMeds Key [id number] Status is pending   Oral Oncology Clinic will continue to follow.  [place] Patient [place] Phone [phone] Fax [phone] 03/09/2021 8:46 AM

## 2021-03-09 NOTE — Telephone Encounter (Signed)
Oral Oncology Pharmacist Encounter  Received new prescription for Imbruvica (ibrutinib) for the treatment of relapsed marginal zone lymphoma, planned duration until disease progression or unacceptable drug toxicity.  CBC w/ Diff and CMP from 12/05/20 assessed, noted pltc of 110 K/uL. Patient will have repeat labs prior to starting Imbruvica. Prescription dose and frequency assessed per Dr. Irene Limbo plan is for patient to start on dose reduction of 420 mg/d for first month, and then if tolerated increase to target dose of 560 mg/d.   Current medication list in Epic reviewed, DDIs with Imbruvica identified: Category C DDI between Imbruvica and Citalopram and well as Imbruvica and Vitamin E due to risk of increase risk of bleeding when using concomitant agents with antiplatelet properties. No change in therapy warranted at this time. Recommend monitoring for s/sx of bleeding once starting therapy.   Evaluated chart and no patient barriers to medication adherence noted.   Patient agreement for treatment documented in MD note on 03/06/21.  Prescription has been e-scribed to the [facility] for benefits analysis and approval.  Oral Oncology Clinic will continue to follow for insurance authorization, copayment issues, initial counseling and start date.  Leron Croak, PharmD, BCPS Hematology/Oncology Clinical Pharmacist [place] [place] and [place] [phone] 03/09/2021 8:45 AM

## 2021-03-10 ENCOUNTER — Telehealth: Payer: Self-pay

## 2021-03-10 ENCOUNTER — Other Ambulatory Visit: Payer: Self-pay

## 2021-03-10 ENCOUNTER — Telehealth: Payer: Self-pay | Admitting: Pharmacist

## 2021-03-10 ENCOUNTER — Inpatient Hospital Stay: Payer: Medicare Other

## 2021-03-10 ENCOUNTER — Other Ambulatory Visit (HOSPITAL_COMMUNITY): Payer: Self-pay

## 2021-03-10 ENCOUNTER — Ambulatory Visit (INDEPENDENT_AMBULATORY_CARE_PROVIDER_SITE_OTHER): Payer: Medicare Other | Admitting: Nurse Practitioner

## 2021-03-10 ENCOUNTER — Encounter: Payer: Self-pay | Admitting: Nurse Practitioner

## 2021-03-10 ENCOUNTER — Encounter: Payer: Self-pay | Admitting: Hematology

## 2021-03-10 ENCOUNTER — Ambulatory Visit: Payer: Medicare Other

## 2021-03-10 DIAGNOSIS — G479 Sleep disorder, unspecified: Secondary | ICD-10-CM | POA: Diagnosis not present

## 2021-03-10 DIAGNOSIS — E2839 Other primary ovarian failure: Secondary | ICD-10-CM | POA: Diagnosis not present

## 2021-03-10 DIAGNOSIS — Z8572 Personal history of non-Hodgkin lymphomas: Secondary | ICD-10-CM | POA: Diagnosis not present

## 2021-03-10 DIAGNOSIS — C8580 Other specified types of non-Hodgkin lymphoma, unspecified site: Secondary | ICD-10-CM

## 2021-03-10 DIAGNOSIS — Z9221 Personal history of antineoplastic chemotherapy: Secondary | ICD-10-CM | POA: Diagnosis not present

## 2021-03-10 DIAGNOSIS — Z0000 Encounter for general adult medical examination without abnormal findings: Secondary | ICD-10-CM | POA: Diagnosis not present

## 2021-03-10 DIAGNOSIS — D696 Thrombocytopenia, unspecified: Secondary | ICD-10-CM

## 2021-03-10 LAB — CBC WITH DIFFERENTIAL/PLATELET
Abs Immature Granulocytes: 0.01 10*3/uL (ref 0.00–0.07)
Basophils Absolute: 0 10*3/uL (ref 0.0–0.1)
Basophils Relative: 0 %
Eosinophils Absolute: 0 10*3/uL (ref 0.0–0.5)
Eosinophils Relative: 1 %
HCT: 39.4 % (ref 36.0–46.0)
Hemoglobin: 13.1 g/dL (ref 12.0–15.0)
Immature Granulocytes: 0 %
Lymphocytes Relative: 26 %
Lymphs Abs: 1.6 10*3/uL (ref 0.7–4.0)
MCH: 30.7 pg (ref 26.0–34.0)
MCHC: 33.2 g/dL (ref 30.0–36.0)
MCV: 92.3 fL (ref 80.0–100.0)
Monocytes Absolute: 0.9 10*3/uL (ref 0.1–1.0)
Monocytes Relative: 14 %
Neutro Abs: 3.6 10*3/uL (ref 1.7–7.7)
Neutrophils Relative %: 59 %
Platelets: 128 10*3/uL — ABNORMAL LOW (ref 150–400)
RBC: 4.27 MIL/uL (ref 3.87–5.11)
RDW: 13.9 % (ref 11.5–15.5)
WBC: 6.1 10*3/uL (ref 4.0–10.5)
nRBC: 0 % (ref 0.0–0.2)

## 2021-03-10 LAB — COMPREHENSIVE METABOLIC PANEL
ALT: 13 U/L (ref 0–44)
AST: 23 U/L (ref 15–41)
Albumin: 3.3 g/dL — ABNORMAL LOW (ref 3.5–5.0)
Alkaline Phosphatase: 55 U/L (ref 38–126)
Anion gap: 6 (ref 5–15)
BUN: 21 mg/dL (ref 8–23)
CO2: 28 mmol/L (ref 22–32)
Calcium: 9 mg/dL (ref 8.9–10.3)
Chloride: 104 mmol/L (ref 98–111)
Creatinine, Ser: 0.93 mg/dL (ref 0.44–1.00)
GFR, Estimated: >60 mL/min (ref >60)
Glucose, Bld: 102 mg/dL — ABNORMAL HIGH (ref 70–99)
Potassium: 4.1 mmol/L (ref 3.5–5.1)
Sodium: 138 mmol/L (ref 135–145)
Total Bilirubin: 0.5 mg/dL (ref 0.3–1.2)
Total Protein: 8.5 g/dL — ABNORMAL HIGH (ref 6.5–8.1)

## 2021-03-10 LAB — LACTATE DEHYDROGENASE: LDH: 322 U/L — ABNORMAL HIGH (ref 98–192)

## 2021-03-10 MED ORDER — TEMAZEPAM 15 MG PO CAPS: ORAL_CAPSULE | ORAL | 3 refills | Status: DC

## 2021-03-10 MED ORDER — IBRUTINIB 140 MG PO CAPS
420.0000 mg | ORAL_CAPSULE | Freq: Every day | ORAL | 0 refills | Status: DC
  Filled 2021-03-10 – 2021-03-11 (×2): qty 90, 30d supply, fill #0

## 2021-03-10 NOTE — Telephone Encounter (Signed)
Sherry Mosley, doi are scheduled for a virtual visit with your provider today.    Just as we do with appointments in the office, we must obtain your consent to participate.  Your consent will be active for this visit and any virtual visit you may have with one of our providers in the next 365 days.    If you have a MyChart account, I can also send a copy of this consent to you electronically.  All virtual visits are billed to your insurance company just like a traditional visit in the office.  As this is a virtual visit, video technology does not allow for your provider to perform a traditional examination.  This may limit your provider's ability to fully assess your condition.  If your provider identifies any concerns that need to be evaluated in person or the need to arrange testing such as labs, EKG, etc, we will make arrangements to do so.    Although advances in technology are sophisticated, we cannot ensure that it will always work on either your end or our end.  If the connection with a video visit is poor, we may have to switch to a telephone visit.  With either a video or telephone visit, we are not always able to ensure that we have a secure connection.   I need to obtain your verbal consent now.   Are you willing to proceed with your visit today?   Sherry Mosley has provided verbal consent on 03/10/2021 for a virtual visit (video or telephone).   Carroll Kinds, CMA 03/10/2021  11:47 AM

## 2021-03-10 NOTE — Progress Notes (Signed)
This service is provided via telemedicine  No vital signs collected/recorded due to the encounter was a telemedicine visit.   Location of patient (ex: home, work):  Home  Patient consents to a telephone visit:  Yes, see encounter dated 03/10/2021  Location of the provider (ex: office, home):  [place]  Name of any referring provider:  N/A  Names of all persons participating in the telemedicine service and their role in the encounter:  Sherrie Mustache, Nurse Practitioner, Carroll Kinds, CMA, and patient.   Time spent on call:  11 minutes with medical assistant

## 2021-03-10 NOTE — Telephone Encounter (Signed)
Oral Oncology Patient Advocate Encounter  Was successful in securing patient a $8100 grant from [employer] to provide copayment coverage for Imbruvica.  This will keep the out of pocket expense at $0.     [employer] ID: [id number]  The billing information is as follows and has been shared with [place]: [id number] PCN: [id number] Member ID: [id number] Group ID: [id number] Dates of Eligibility: 02/08/21 through 02/07/22  Fund:  [place] Patient [place] Phone [phone] Fax [phone] 03/10/2021 2:51 PM

## 2021-03-10 NOTE — Telephone Encounter (Signed)
Oral Oncology Pharmacist Encounter  Met with patient while in clinic today to complete application for Wynetta Emery and [facility] Patient [facility] in an effort to reduce the patient's out of pocket expense for Imbruvica to $0.  Application completed and faxed to: [phone]  JJPAF phone number for follow up is: [phone]  This encounter will be updated until final determination.   Leron Croak, PharmD, BCPS Hematology/Oncology Clinical Pharmacist [facility] [phone] 03/10/2021 3:18 PM

## 2021-03-10 NOTE — Progress Notes (Signed)
Subjective:   Sherry Mosley is a 76 y.o. female who presents for Medicare Annual (Subsequent) preventive examination.  Review of Systems     Cardiac Risk Factors include: advanced age (>36men, >32 women);hypertension;dyslipidemia     Objective:    There were no vitals filed for this visit. There is no height or weight on file to calculate BMI.  Advanced Directives 03/10/2021 02/04/2021 12/05/2020 11/21/2020 11/05/2020 10/14/2020 08/09/2019  Does Patient Have a Medical Advance Directive? No Yes No No No Yes No  Type of Advance Directive - Out of facility DNR (pink MOST or yellow form) - - - Press photographer;Living will;Out of facility DNR (pink MOST or yellow form) -  Does patient want to make changes to medical advance directive? - - - - - No - Patient declined -  Copy of [place] in Chart? - - - - - No - copy requested -  Would patient like information on creating a medical advance directive? - No - Patient declined No - Patient declined - Yes (MAU/Ambulatory/Procedural Areas - Information given) - Yes (MAU/Ambulatory/Procedural Areas - Information given)    Current Medications (verified) Outpatient Encounter Medications as of 03/10/2021  Medication Sig   acetaminophen (TYLENOL) 325 MG tablet Take 650 mg by mouth every 6 (six) hours as needed.   Ascorbic Acid (VITAMIN C) 1000 MG tablet Take 1 tablet by mouth daily.   [place]-Glucosamine-Vit D (OSTEO BI-FLEX ONE PER DAY PO) Take by mouth.   Cholecalciferol (VITAMIN D3) 50 MCG (2000 UT) capsule Take 1 capsule by mouth daily.   citalopram (CELEXA) 10 MG tablet Take 1 tablet (10 mg total) by mouth daily.   lisinopril (ZESTRIL) 5 MG tablet Take 1 tablet (5 mg total) by mouth daily.   Loratadine 10 MG CAPS Take 1 tablet by mouth daily.   simvastatin (ZOCOR) 20 MG tablet Take 1 tablet (20 mg total) by mouth daily.   vitamin E 400 UNIT capsule Take 1 capsule by mouth daily.   [DISCONTINUED] temazepam (RESTORIL)  15 MG capsule TAKE 1 CAPSULE BY MOUTH AT BEDTIME AS NEEDED FOR SLEEP.   temazepam (RESTORIL) 15 MG capsule TAKE 1 CAPSULE BY MOUTH AT BEDTIME AS NEEDED FOR SLEEP.   [DISCONTINUED] ibrutinib (IMBRUVICA) 140 MG capsul Take 3 capsules (420 mg total) by mouth daily.   No facility-administered encounter medications on file as of 03/10/2021.    Allergies (verified) Cephalosporins and Penicillins   History: Past Medical History:  Diagnosis Date   Generalized osteoarthritis    Hyperlipidemia    Hypertension    Mood disorder ([place])    Non Hodgkin's lymphoma ([place])    Sleep disorder    Past Surgical History:  Procedure Laterality Date   ABDOMINAL HYSTERECTOMY  1997   ANKLE ARTHRODESIS Right 2012   BREAST BIOPSY Left 1969   fibroadenoma   CATARACT EXTRACTION W/ INTRAOCULAR LENS IMPLANT Bilateral    LYMPH NODE BIOPSY  2014   excisional biopsy right groin   Thumb surgery     removal of part of extensor tendon   Family History  Problem Relation Age of Onset   Alzheimer's disease Mother    Heart failure Father    Prostate cancer Father    Diabetes Maternal Grandfather    Breast cancer Neg Hx    Social History   Socioeconomic History   Marital status: Married    Spouse name: Not on file   Number of children: 2   Years of education: Not on  file   Highest education level: Not on file  Occupational History   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 Son is alternate   Would accept resuscitation   No tube feeds if cognitively  unaware   Social Determinants of Health   Financial Resource Strain: Not on file  Food Insecurity: Not on file  Transportation Needs: Not on file  Physical Activity: Not on file  Stress: Not on file  Social Connections: Not on file    Tobacco Counseling Counseling given: Not Answered   Clinical Intake:  Pre-visit preparation completed: Yes  Pain : No/denies pain     BMI - recorded: 25 Nutritional Status: BMI 25 -29 Overweight Nutritional Risks: None Diabetes: No  How often do you need to have someone help you when you read instructions, pamphlets, or other written materials from your doctor or pharmacy?: 1 - Never  Diabetic?no         Activities of Daily Living In your present state of health, do you have any difficulty performing the following activities: 03/10/2021  Hearing? N  Vision? N  Difficulty concentrating or making decisions? N  Walking or climbing stairs? N  Dressing or bathing? N  Doing errands, shopping? N  Preparing Food and eating ? N  Using the Toilet? N  In the past six months, have you accidently leaked urine? N  Do you have problems with loss of bowel control? N  Managing your Medications? N  Managing your Finances? N  Housekeeping or managing your Housekeeping? N  Some recent data might be hidden    Patient Care Team: [name], NP as PCP - General (Geriatric Medicine)  Indicate any recent Medical Services you may have received from other than [facility] providers in the past year (date may be approximate).     Assessment:   This is a routine wellness examination for Jazari.  Hearing/Vision screen Hearing Screening - Comments:: Patient has no hearing problems Vision Screening - Comments:: Patient wears glasses or contacts. Patient had last eye exam a year ago. Patient can't remember doctor's name  Dietary issues and exercise activities discussed: Current Exercise Habits: Home exercise routine, Type of exercise: walking, Time  (Minutes): 10, Frequency (Times/Week): 7, Weekly Exercise (Minutes/Week): 70   Goals Addressed   None    Depression Screen PHQ 2/9 Scores 03/10/2021 02/05/2019  PHQ - 2 Score 0 0    Fall Risk Fall Risk  03/10/2021 10/14/2020  Falls in the past year? 0 0  Number falls in past yr: 0 0  Injury with Fall? 0 0  Risk for fall due to : No Fall Risks No Fall Risks  Follow up Falls evaluation completed Falls evaluation completed    [place]:  Any stairs in or around the home? No  If so, are there any without handrails? No  Home free of loose throw rugs in walkways, pet  beds, electrical cords, etc? Yes  Adequate lighting in your home to reduce risk of falls? Yes   ASSISTIVE DEVICES UTILIZED TO PREVENT FALLS:  Life alert? No  Use of a cane, walker or w/c? No  Grab bars in the bathroom? Yes  Shower chair or bench in shower? Yes  Elevated toilet seat or a handicapped toilet? No   TIMED UP AND GO:  Was the test performed? No .    Cognitive Function:     6CIT Screen 03/10/2021  What Year? 0 points  What month? 0 points  What time? 0 points  Count back from 20 0 points  Months in reverse 0 points  Repeat phrase 0 points  Total Score 0    Immunizations Immunization History  Administered Date(s) Administered   Influenza-Unspecified 10/31/2018, 12/04/2019   Moderna Sars-Covid-2 Vaccination 04/09/2019, 05/07/2019, 12/11/2019   Pneumococcal Conjugate-13 12/24/2014   Pneumococcal Polysaccharide-23 07/11/2012   Tdap 12/31/2006, 02/13/2020   Zoster, Live 01/30/2019    TDAP status: Up to date  Flu Vaccine status: Up to date  Pneumococcal vaccine status: Up to date  Covid-19 vaccine status: Completed vaccines  Qualifies for Shingles Vaccine? Yes   Zostavax completed Yes   Shingrix Completed?: Yes  Screening Tests Health Maintenance  Topic Date Due   Zoster Vaccines- Shingrix (1 of 2) Never done   DEXA SCAN  Never done   COVID-19  Vaccine (4 - Booster for Moderna series) 02/05/2020   INFLUENZA VACCINE  09/29/2020   TETANUS/TDAP  02/12/2030   Pneumonia Vaccine 77+ Years old  Completed   Hepatitis C Screening  Completed   HPV VACCINES  Aged Out    Health Maintenance  Health Maintenance Due  Topic Date Due   Zoster Vaccines- Shingrix (1 of 2) Never done   DEXA SCAN  Never done   COVID-19 Vaccine (4 - Booster for Moderna series) 02/05/2020   INFLUENZA VACCINE  09/29/2020    Colorectal cancer screening: No longer required.   Mammogram status: Completed yearly. Repeat every year  Bone Density status: Ordered today. Pt provided with contact info and advised to call to schedule appt.  Lung Cancer Screening: (Low Dose CT Chest recommended if Age 21-80 years, 30 pack-year currently smoking OR have quit w/in 15years.) does not qualify.   Lung Cancer Screening Referral: nb=a  Additional Screening:  Hepatitis C Screening: does qualify; Completed   Vision Screening: Recommended annual ophthalmology exams for early detection of glaucoma and other disorders of the eye. Is the patient up to date with their annual eye exam?  Yes  Who is the provider or what is the name of the office in which the patient attends annual eye exams? unsure If pt is not established with a provider, would they like to be referred to a provider to establish care? No .   Dental Screening: Recommended annual dental exams for proper oral hygiene  Community Resource Referral / Chronic Care Management: CRR required this visit?  No   CCM required this visit?  No      Plan:     I have personally reviewed and noted the following in the patients chart:   Medical and social history Use of alcohol, tobacco or illicit drugs  Current medications and supplements including opioid prescriptions.  Functional ability and status Nutritional status Physical activity Advanced directives List of other physicians Hospitalizations, surgeries, and  ER visits in previous 12 months Vitals Screenings to include cognitive, depression, and falls Referrals and appointments  In addition,  I have reviewed and discussed with patient certain preventive protocols, quality metrics, and best practice recommendations. A written personalized care plan for preventive services as well as general preventive health recommendations were provided to patient.     [name], NP   03/10/2021    Virtual Visit via Telephone Note  I connected withNAME@ on 03/10/21 at 11:30 AM EST by telephone and verified that I am speaking with the correct person using two identifiers.  Location: Patient: home Provider: [facility]    I discussed the limitations, risks, security and privacy concerns of performing an evaluation and management service by telephone and the availability of in person appointments. I also discussed with the patient that there may be a patient responsible charge related to this service. The patient expressed understanding and agreed to proceed.   I discussed the assessment and treatment plan with the patient. The patient was provided an opportunity to ask questions and all were answered. The patient agreed with the plan and demonstrated an understanding of the instructions.   The patient was advised to call back or seek an in-person evaluation if the symptoms worsen or if the condition fails to improve as anticipated.  I provided 15 minutes of non-face-to-face time during this encounter.  [employer]. Harle Battiest Avs printed and mailed

## 2021-03-10 NOTE — Patient Instructions (Signed)
Sherry Mosley , Thank you for taking time to come for your Medicare Wellness Visit. I appreciate your ongoing commitment to your health goals. Please review the following plan we discussed and let me know if I can assist you in the future.   Screening recommendations/referrals: Colonoscopy aged out Mammogram up to date Bone Density ordered today Recommended yearly ophthalmology/optometry visit for glaucoma screening and checkup Recommended yearly dental visit for hygiene and checkup  Vaccinations: Influenza vaccine up to date Pneumococcal vaccine up to date Tdap vaccine up to date Shingles vaccine up to date    Advanced directives: please bring documents to your next visit.   Conditions/risks identified: advance age. Hyperlipidemia, hypertension.   Next appointment: yearly for AWV   Preventive Care 78 Years and Older, Female Preventive care refers to lifestyle choices and visits with your health care provider that can promote health and wellness. What does preventive care include? A yearly physical exam. This is also called an annual well check. Dental exams once or twice a year. Routine eye exams. Ask your health care provider how often you should have your eyes checked. Personal lifestyle choices, including: Daily care of your teeth and gums. Regular physical activity. Eating a healthy diet. Avoiding tobacco and drug use. Limiting alcohol use. Practicing safe sex. Taking low-dose aspirin every day. Taking vitamin and mineral supplements as recommended by your health care provider. What happens during an annual well check? The services and screenings done by your health care provider during your annual well check will depend on your age, overall health, lifestyle risk factors, and family history of disease. Counseling  Your health care provider may ask you questions about your: Alcohol use. Tobacco use. Drug use. Emotional well-being. Home and relationship  well-being. Sexual activity. Eating habits. History of falls. Memory and ability to understand (cognition). Work and work Statistician. Reproductive health. Screening  You may have the following tests or measurements: Height, weight, and BMI. Blood pressure. Lipid and cholesterol levels. These may be checked every 5 years, or more frequently if you are over 62 years old. Skin check. Lung cancer screening. You may have this screening every year starting at age 78 if you have a 30-pack-year history of smoking and currently smoke or have quit within the past 15 years. Fecal occult blood test (FOBT) of the stool. You may have this test every year starting at age 78. Flexible sigmoidoscopy or colonoscopy. You may have a sigmoidoscopy every 5 years or a colonoscopy every 10 years starting at age 78. Hepatitis C blood test. Hepatitis B blood test. Sexually transmitted disease (STD) testing. Diabetes screening. This is done by checking your blood sugar (glucose) after you have not eaten for a while (fasting). You may have this done every 1-3 years. Bone density scan. This is done to screen for osteoporosis. You may have this done starting at age 78. Mammogram. This may be done every 1-2 years. Talk to your health care provider about how often you should have regular mammograms. Talk with your health care provider about your test results, treatment options, and if necessary, the need for more tests. Vaccines  Your health care provider may recommend certain vaccines, such as: Influenza vaccine. This is recommended every year. Tetanus, diphtheria, and acellular pertussis (Tdap, Td) vaccine. You may need a Td booster every 10 years. Zoster vaccine. You may need this after age 78. Pneumococcal 13-valent conjugate (PCV13) vaccine. One dose is recommended after age 78. Pneumococcal polysaccharide (PPSV23) vaccine. One dose is recommended after  age 86. Talk to your health care provider about which  screenings and vaccines you need and how often you need them. This information is not intended to replace advice given to you by your health care provider. Make sure you discuss any questions you have with your health care provider. Document Released: 03/14/2015 Document Revised: 11/05/2015 Document Reviewed: 12/17/2014 Elsevier Interactive Patient Education  2017 [employer] Prevention in the Home Falls can cause injuries. They can happen to people of all ages. There are many things you can do to make your home safe and to help prevent falls. What can I do on the outside of my home? Regularly fix the edges of walkways and driveways and fix any cracks. Remove anything that might make you trip as you walk through a door, such as a raised step or threshold. Trim any bushes or trees on the path to your home. Use bright outdoor lighting. Clear any walking paths of anything that might make someone trip, such as rocks or tools. Regularly check to see if handrails are loose or broken. Make sure that both sides of any steps have handrails. Any raised decks and porches should have guardrails on the edges. Have any leaves, snow, or ice cleared regularly. Use sand or salt on walking paths during winter. Clean up any spills in your garage right away. This includes oil or grease spills. What can I do in the bathroom? Use night lights. Install grab bars by the toilet and in the tub and shower. Do not use towel bars as grab bars. Use non-skid mats or decals in the tub or shower. If you need to sit down in the shower, use a plastic, non-slip stool. Keep the floor dry. Clean up any water that spills on the floor as soon as it happens. Remove soap buildup in the tub or shower regularly. Attach bath mats securely with double-sided non-slip rug tape. Do not have throw rugs and other things on the floor that can make you trip. What can I do in the bedroom? Use night lights. Make sure that you have a  light by your bed that is easy to reach. Do not use any sheets or blankets that are too big for your bed. They should not hang down onto the floor. Have a firm chair that has side arms. You can use this for support while you get dressed. Do not have throw rugs and other things on the floor that can make you trip. What can I do in the kitchen? Clean up any spills right away. Avoid walking on wet floors. Keep items that you use a lot in easy-to-reach places. If you need to reach something above you, use a strong step stool that has a grab bar. Keep electrical cords out of the way. Do not use floor polish or wax that makes floors slippery. If you must use wax, use non-skid floor wax. Do not have throw rugs and other things on the floor that can make you trip. What can I do with my stairs? Do not leave any items on the stairs. Make sure that there are handrails on both sides of the stairs and use them. Fix handrails that are broken or loose. Make sure that handrails are as long as the stairways. Check any carpeting to make sure that it is firmly attached to the stairs. Fix any carpet that is loose or worn. Avoid having throw rugs at the top or bottom of the stairs. If you do  have throw rugs, attach them to the floor with carpet tape. Make sure that you have a light switch at the top of the stairs and the bottom of the stairs. If you do not have them, ask someone to add them for you. What else can I do to help prevent falls? Wear shoes that: Do not have high heels. Have rubber bottoms. Are comfortable and fit you well. Are closed at the toe. Do not wear sandals. If you use a stepladder: Make sure that it is fully opened. Do not climb a closed stepladder. Make sure that both sides of the stepladder are locked into place. Ask someone to hold it for you, if possible. Clearly mark and make sure that you can see: Any grab bars or handrails. First and last steps. Where the edge of each step  is. Use tools that help you move around (mobility aids) if they are needed. These include: Canes. Walkers. Scooters. Crutches. Turn on the lights when you go into a dark area. Replace any light bulbs as soon as they burn out. Set up your furniture so you have a clear path. Avoid moving your furniture around. If any of your floors are uneven, fix them. If there are any pets around you, be aware of where they are. Review your medicines with your doctor. Some medicines can make you feel dizzy. This can increase your chance of falling. Ask your doctor what other things that you can do to help prevent falls. This information is not intended to replace advice given to you by your health care provider. Make sure you discuss any questions you have with your health care provider. Document Released: 12/12/2008 Document Revised: 07/24/2015 Document Reviewed: 03/22/2014 Elsevier Interactive Patient Education  2017 [employer].

## 2021-03-11 ENCOUNTER — Other Ambulatory Visit (HOSPITAL_COMMUNITY): Payer: Self-pay

## 2021-03-11 ENCOUNTER — Ambulatory Visit: Payer: Medicare Other

## 2021-03-11 NOTE — Telephone Encounter (Signed)
Oral Chemotherapy Pharmacist Encounter  I spoke with patient for overview of: Imbruvica (ibrutinib) for the treatment of relapsed marginal zone lymphoma, planned duration until disease progression or unacceptable toxicity.   Counseled patient on administration, dosing, side effects, monitoring, drug-food interactions, safe handling, storage, and disposal.  Patient will take Imbruvica 140mg  capsules, 3 capsules (420mg  total) by mouth once daily. If tolerated patient may increase up to target dose of 560 mg/d with second cycle of medication per Dr. Irene Limbo. Repeat labs from 03/10/21 reviewed by Dr. Irene Limbo and patient is OK to proceed with therapy at this time.   Patient will take Imbruvica at approximately the same time each day with a full glass of water and maintain adequate hydration throughout the day.  Patient knows to avoid grapefruit or grapefruit juice while on therapy with Imbruvica.  Imbruvica start date: 03/14/21  Adverse effects include but are not limited to: bruising, decreased blood counts, N/V, diarrhea, musculoskeletal pain, arthralgias, peripheral edema, and hemorrhage.    Patient will obtain anti diarrheal and alert the office of 4 or more loose stools above baseline.  Reviewed with patient importance of keeping a medication schedule and plan for any missed doses. No barriers to medication adherence identified.  Medication reconciliation performed and medication/allergy list updated. Discussed slight increase in bleed risk between Imbruvica, citalopram and vitamin E - patient is aware to monitor for s/sx of bleeding/unusual bruising.  Insurance authorization for Kate Sable has been obtained. Test claim at the pharmacy revealed copayment [id number] for 1st fill of Imbruvica. Fatima Sanger obtained for patient bringing out of pocket cost to $0 for first fill.  This will ship from the [facility] on 03/11/21 to deliver to patient's home on 03/13/21.  Patient informed the pharmacy  will reach out 5-7 days prior to needing next fill of Imbruvica to coordinate continued medication acquisition to prevent break in therapy.  All questions answered.  Ms. Latulippe voiced understanding and appreciation.   Medication education handout placed in mail for patient. Patient knows to call the office with questions or concerns. Oral Chemotherapy Clinic phone number provided to patient.   Leron Croak, PharmD, BCPS Hematology/Oncology Clinical Pharmacist [facility] [place] [phone] 03/11/2021 10:25 AM

## 2021-03-11 NOTE — Telephone Encounter (Signed)
Oral Chemotherapy Pharmacist Encounter   Attempted to reach patient to provide update and offer for initial counseling on oral medication: Imbruvica (ibrutinib).   No answer. Left voicemail for patient to call back to discuss details of medication acquisition and initial counseling session.  Leron Croak, PharmD, BCPS Hematology/Oncology Clinical Pharmacist [facility] [place] [phone] 03/11/2021 9:56 AM

## 2021-03-12 ENCOUNTER — Ambulatory Visit: Payer: Medicare Other

## 2021-03-13 ENCOUNTER — Ambulatory Visit: Payer: Medicare Other

## 2021-03-13 ENCOUNTER — Other Ambulatory Visit: Payer: Medicare Other

## 2021-03-16 ENCOUNTER — Ambulatory Visit: Payer: Medicare Other

## 2021-03-17 ENCOUNTER — Ambulatory Visit: Payer: Medicare Other

## 2021-03-18 ENCOUNTER — Ambulatory Visit: Payer: Medicare Other

## 2021-03-19 ENCOUNTER — Ambulatory Visit: Payer: Medicare Other

## 2021-03-20 ENCOUNTER — Ambulatory Visit: Payer: Medicare Other

## 2021-03-27 ENCOUNTER — Inpatient Hospital Stay (HOSPITAL_BASED_OUTPATIENT_CLINIC_OR_DEPARTMENT_OTHER): Payer: Medicare Other | Admitting: Hematology

## 2021-03-27 ENCOUNTER — Other Ambulatory Visit: Payer: Self-pay

## 2021-03-27 ENCOUNTER — Inpatient Hospital Stay: Payer: Medicare Other

## 2021-03-27 VITALS — BP 121/67 | HR 93 | Temp 97.7°F | Resp 18 | Wt 142.6 lb

## 2021-03-27 DIAGNOSIS — C8580 Other specified types of non-Hodgkin lymphoma, unspecified site: Secondary | ICD-10-CM | POA: Diagnosis not present

## 2021-03-27 DIAGNOSIS — D696 Thrombocytopenia, unspecified: Secondary | ICD-10-CM | POA: Diagnosis not present

## 2021-03-27 DIAGNOSIS — R233 Spontaneous ecchymoses: Secondary | ICD-10-CM | POA: Diagnosis not present

## 2021-03-27 DIAGNOSIS — Z9221 Personal history of antineoplastic chemotherapy: Secondary | ICD-10-CM | POA: Diagnosis not present

## 2021-03-27 DIAGNOSIS — Z8572 Personal history of non-Hodgkin lymphomas: Secondary | ICD-10-CM | POA: Diagnosis not present

## 2021-03-27 LAB — CMP (CANCER CENTER ONLY)
ALT: 20 U/L (ref 0–44)
AST: 24 U/L (ref 15–41)
Albumin: 3.3 g/dL — ABNORMAL LOW (ref 3.5–5.0)
Alkaline Phosphatase: 62 U/L (ref 38–126)
Anion gap: 5 (ref 5–15)
BUN: 25 mg/dL — ABNORMAL HIGH (ref 8–23)
CO2: 30 mmol/L (ref 22–32)
Calcium: 8.7 mg/dL — ABNORMAL LOW (ref 8.9–10.3)
Chloride: 104 mmol/L (ref 98–111)
Creatinine: 0.98 mg/dL (ref 0.44–1.00)
GFR, Estimated: 59 mL/min — ABNORMAL LOW (ref >60)
Glucose, Bld: 115 mg/dL — ABNORMAL HIGH (ref 70–99)
Potassium: 3.9 mmol/L (ref 3.5–5.1)
Sodium: 139 mmol/L (ref 135–145)
Total Bilirubin: 0.5 mg/dL (ref 0.3–1.2)
Total Protein: 7.9 g/dL (ref 6.5–8.1)

## 2021-03-27 LAB — CBC WITH DIFFERENTIAL/PLATELET
Abs Immature Granulocytes: 0 10*3/uL (ref 0.00–0.07)
Basophils Absolute: 0 10*3/uL (ref 0.0–0.1)
Basophils Relative: 0 %
Eosinophils Absolute: 0 10*3/uL (ref 0.0–0.5)
Eosinophils Relative: 0 %
HCT: 41.8 % (ref 36.0–46.0)
Hemoglobin: 13.7 g/dL (ref 12.0–15.0)
Lymphocytes Relative: 79 %
Lymphs Abs: 10.1 10*3/uL — ABNORMAL HIGH (ref 0.7–4.0)
MCH: 30.5 pg (ref 26.0–34.0)
MCHC: 32.8 g/dL (ref 30.0–36.0)
MCV: 93.1 fL (ref 80.0–100.0)
Monocytes Absolute: 0.4 10*3/uL (ref 0.1–1.0)
Monocytes Relative: 3 %
Neutro Abs: 2.3 10*3/uL (ref 1.7–7.7)
Neutrophils Relative %: 18 %
Platelets: 109 10*3/uL — ABNORMAL LOW (ref 150–400)
RBC: 4.49 MIL/uL (ref 3.87–5.11)
RDW: 13.8 % (ref 11.5–15.5)
Smear Review: NORMAL
WBC: 12.8 10*3/uL — ABNORMAL HIGH (ref 4.0–10.5)
nRBC: 0 % (ref 0.0–0.2)

## 2021-03-27 LAB — LACTATE DEHYDROGENASE: LDH: 114 U/L (ref 98–192)

## 2021-03-30 ENCOUNTER — Ambulatory Visit: Payer: Medicare Other | Admitting: Hematology

## 2021-04-01 ENCOUNTER — Telehealth: Payer: Self-pay | Admitting: Hematology

## 2021-04-01 NOTE — Telephone Encounter (Signed)
Scheduled follow-up appointment per 1/27 los. Patient is aware. °

## 2021-04-02 ENCOUNTER — Encounter: Payer: Self-pay | Admitting: Hematology

## 2021-04-02 NOTE — Progress Notes (Addendum)
HEMATOLOGY/ONCOLOGY CLINIC NOTE  Date of Service: .03/27/2021   Patient Care Team: [name], NP as PCP - General (Geriatric Medicine)  CHIEF COMPLAINTS/PURPOSE OF CONSULTATION:  Follow-up for relapsed marginal zone lymphoma and toxicity check for ibrutinib  ONCOLOGIC Hx:  B-cell lymphoproliferative disorder, NOS, diagnosed originally by fine needle aspiration of right groin lymph node on 10/25/12  S/p excisional biopsy of right inguinal lymph node on 12/08/12 with finding of B cell lymphoproliferative disorder with indistinct phenotype.  PET and CT scans performed on 12/21/12 consistent with clinical stage disease with adenopathy in both inguinal and femoral areas and left posterior triangle in the neck and probably the left axilla.  Completion of five cycles of CVP-R from 05/05/18 through 08/18/18.   12/08/2012 Surgical pathology of right groin lymph node found: Abnormal Lymphoma FISH result, loss of IGH/14q DNA sequence   HISTORY OF PRESENTING ILLNESS:   See previous notes for details of initial presentation  INTERVAL HISTORY:   Sherry Mosley is here for follow-up of her relapsed marginal zone lymphoma and for toxicity check of her recently started Ibrutinib.  She was tolerating ibrutinib well for the first 2 weeks but then developed some mild petechiae on her lower extremities.  No itching no redness. No other issues with bleeding or petechiae elsewhere. She notes that she has been in a new exercise class and potentially was using her lower extremities more than usual. She has held her ibrutinib for the last day or so. Issues with hypertension.  No palpitations.  No leg swelling or other fluid retention.  Labs today show hemoglobin of 13.7 with a WBC count of 12.8k with lymphocytes of 10k and platelets of 109k.  We discussed pros and cons of continuing ibrutinib.  We decided to start her on Claritin plus famotidine for prophylaxis of allergic reactions and  restart the ibrutinib at 140 mg for 3 to 4 days and then go up to 280 mg for 3 to 4 days and then to her current dose of 420 mg p.o. daily if no increase in petechiae.   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 Son is alternate   Would accept resuscitation   No tube feeds if cognitively unaware   Social Determinants of Health   Financial Resource Strain: Not on file  Food Insecurity: Not on file  Transportation Needs: Not on file  Physical Activity: Not on file  Stress:  Not on file  Social Connections: Not on file  Intimate Partner Violence: Not on file    FAMILY HISTORY: Family History  Problem Relation Age of Onset   Alzheimer's disease Mother    Heart failure Father    Prostate cancer Father    Diabetes Maternal Grandfather    Breast cancer Neg Hx     ALLERGIES:  is allergic to cephalosporins, penicillins, and imbruvica [ibrutinib].  PCN - significant rash  Cephalosporins -  significant rash   MEDICATIONS:  Current Outpatient Medications  Medication Sig Dispense Refill   acetaminophen (TYLENOL) 325 MG tablet Take 650 mg by mouth every 6 (six) hours as needed.     Ascorbic Acid (VITAMIN C) 1000 MG tablet Take 1 tablet by mouth daily.     Boswellia-Glucosamine-Vit D (OSTEO BI-FLEX ONE PER DAY PO) Take by mouth.     Cholecalciferol (VITAMIN D3) 50 MCG (2000 UT) capsule Take 1 capsule by mouth daily.     citalopram (CELEXA) 10 MG tablet Take 1 tablet (10 mg total) by mouth daily. 90 tablet 1   ibrutinib (IMBRUVICA) 140 MG capsul Take 3 capsules (420 mg total) by mouth daily. (Patient not taking: Reported on 03/27/2021) 90 capsule 0   lisinopril (ZESTRIL) 5 MG tablet Take 1 tablet (5 mg total) by mouth daily. 90 tablet 1   Loratadine 10 MG CAPS Take 1 tablet by mouth daily.     simvastatin (ZOCOR) 20 MG tablet Take 1 tablet (20 mg total) by mouth daily. 90 tablet 1   temazepam (RESTORIL) 15 MG capsule TAKE 1 CAPSULE BY MOUTH AT BEDTIME AS NEEDED FOR SLEEP. 30 capsule 3   vitamin E 400 UNIT capsule Take 1 capsule by mouth daily.     No current facility-administered medications for this visit.  Lisinopril 43m -  HTN  Simvastatin 260m- HLD  REVIEW OF SYSTEMS:  .10 Point review of Systems was done is negative except as noted above.    PHYSICAL EXAMINATION: Telemedicine visit  LABORATORY DATA:  I have reviewed the data as listed  . CBC Latest Ref Rng & Units 03/27/2021 03/10/2021 12/05/2020  WBC 4.0 - 10.5 K/uL 12.8(H) 6.1 6.6  Hemoglobin 12.0 - 15.0 g/dL 13.7 13.1 14.8  Hematocrit 36.0 - 46.0 % 41.8 39.4 44.8  Platelets 150 - 400 K/uL 109(L) 128(L) 110(L)    CMP Latest Ref Rng & Units 03/27/2021 03/10/2021 01/28/2021  Glucose 70 - 99 mg/dL 115(H) 102(H) -  BUN 8 - 23 mg/dL 25(H) 21 -  Creatinine 0.44 - 1.00 mg/dL 0.98 0.93 1.00  Sodium 135 - 145 mmol/L 139 138 -  Potassium 3.5 - 5.1 mmol/L 3.9 4.1 -  Chloride 98 - 111 mmol/L 104 104 -  CO2 22 - 32 mmol/L  30 28 -  Calcium 8.9 - 10.3 mg/dL 8.7(L) 9.0 -  Total Protein 6.5 - 8.1 g/dL 7.9 8.5(H) -  Total Bilirubin 0.3 - 1.2 mg/dL 0.5 0.5 -  Alkaline Phos 38 - 126 U/L 62 55 -  AST 15 - 41 U/L 24 23 -  ALT 0 - 44 U/L 20 13 -   SURGICAL PATHOLOGY  CASE: [id number]  PATIENT: Sherry Mosley  Surgical Pathology Report   Specimen Submitted:  A. Lymph node, right inguinal   Clinical History: History of low grade B cell lymphoma, now with right  inguinal lymphadenopathy, post [place]ided BX   DIAGNOSIS:  A. LYMPH NODE, RIGHT INGUINAL; ULTRASOUND-GUIDED BIOPSY:  - LOW-GRADE B-CELL LYMPHOMA WITH FEATURES MOST SUGGESTIVE  OF MARGINAL  ZONE LYMPHOMA.   Comment:  Sections demonstrate small to medium sized lymphocytes with monocytoid  features.  Scattered plasma cells are present. Residual normal lymphoid  architecture is not identified.  Material was submitted for flow cytometric analysis which revealed a  monoclonal kappa B cell population that was negative for CD5, CD23, and  CD10 (see scanned report in [facility]).  A panel of immunohistochemical stains was performed with the following  pattern of immunoreactivity:  CD20: Positive in neoplastic lymphocytes  CD3: Highlights background T cells in a distribution similar to CD5  CD5: Highlights background T cells in a distribution similar to CD3  CD23: Highlights focal residual dendritic meshworks  CD10: Negative in neoplastic lymphocytes  BCL-2: Positive in neoplastic lymphocytes  BCL 6: Negative in neoplastic lymphocytes  Cyclin D1: Negative in neoplastic lymphocytes  Sox 11: Negative in neoplastic lymphocytes  Kappa-ISH: Positive in neoplastic lymphocytes  Lambda-ISH: Negative in neoplastic lymphocytes  Ki-67: Approximately 30%  The histologic features in conjunction with the immunophenotypic  findings support the diagnosis of a low-grade mature B-cell lymphoma  most suggestive of marginal zone lymphoma.   Sox 11 IHC and kappa / lambda ISH  slides were prepared by [facility] [employer], [place], [place].  The remaining IHC slides were prepared by [facility], [place], [place]. All controls stained appropriately.   This test was developed and its performance characteristics determined  by [facility]. It has not been cleared or approved by the [place] Food and Drug  Administration. The FDA does not require this test to go through  premarket FDA review. This test is used for clinical purposes. It should  not be regarded as investigational or for research. This laboratory is  certified under the Clinical Laboratory Improvement Amendments (CLIA) as  qualified to perform high complexity clinical laboratory testing.   GROSS DESCRIPTION:  A. Labeled: Right inguinal lymph node  Received: The specimen is received fresh on saline soaked gauze.  Tissue fragment(s): Multiple  Size: Range from 0.5 to 1.9 cm in length and 0.1 cm in diameter  Description: Received are multiple cores and fragments of tan-white soft  tissue.  1 touch prep with Diff-Quik stain is performed.  Representative  sections are submitted for flow cytometry in RPMI.  The remainder of the  specimen is submitted in cassettes 1-4 with 1 core per cassette.    Final Diagnosis performed by Quay Burow, MD.   Electronically signed  08/31/2019 1:38:09PM  The electronic signature indicates that the named Attending Pathologist  has evaluated the specimen  Technical component performed at [facility], [address], [place],  [place] [address] Lab: [id number] Dir: Rush Farmer, MD, MMM   Professional component performed at [facility], [facility], [place], [place], [place] [address] Lab: [phone]  Dir: Dellia Nims. Reuel Derby, MD   RADIOGRAPHIC STUDIES: I have personally reviewed the radiological images as listed and agreed with the findings in the report. No results found.  ASSESSMENT & PLAN:   76 y.o. with   1) Atleast Stage IIIA Low grade B cell Non Hodgkins Lymphoma -consistent with Marginal zone lymphoma S/p R-CVP x  five cycles from 05/05/18 through 08/18/18.  Patient with progression of disease with bulky abdominal pelvic disease with significant right groin bulky adenopathy. Patient was started on weekly Rituxan and received 2 total weekly treatments on 11/21/2020 and 11/28/2020 and developed serum sickness like reaction which is why it was discontinued.  2)  Serum sickness-like reaction to Rituxan. (2nd weekly dose)  3) petechiae on lower extremities after starting ibrutinib PLAN: -Tolerated ibrutinib well for the first couple of weeks but then developed some petechiae on her lower extremities in the context of increased physical activity.  Labs today show hemoglobin of 13.7 with a WBC count of 12.8k with lymphocytes of 10k and platelets of 109k.  We discussed pros and cons of continuing ibrutinib.  We decided to start her on Claritin plus famotidine for prophylaxis of allergic reactions and restart the ibrutinib at 140 mg for 3 to 4 days and then go up to 280 mg for 3 to 4 days and then to her current dose of 420 mg p.o. daily if no increase in petechiae.  If increasing petechial rash she will stop the medication immediately and call us.  We also recommended using compression socks in the lower extremities for support of her blood vessels in the context of increased hemostatic pressure with exercise.  Avoid NSAIDs. Patient is on Celexa which can also cause some platelet dysfunction and we shall need to monitor for potential increased risk of bleeding with the combination.  FOLLOW UP: RTC with Dr Irene Limbo in 3 weeks with labs   All of the patient's questions were answered with apparent satisfaction. The patient knows to call the clinic with any problems, questions or concerns.   Sullivan Lone MD Kila AAHIVMS [facility] Hematology/Oncology Physician [facility]

## 2021-04-16 ENCOUNTER — Other Ambulatory Visit: Payer: Self-pay

## 2021-04-16 DIAGNOSIS — C8580 Other specified types of non-Hodgkin lymphoma, unspecified site: Secondary | ICD-10-CM

## 2021-04-17 ENCOUNTER — Inpatient Hospital Stay: Payer: Medicare Other | Attending: Hematology | Admitting: Hematology

## 2021-04-17 ENCOUNTER — Inpatient Hospital Stay: Payer: Medicare Other

## 2021-04-17 ENCOUNTER — Other Ambulatory Visit (HOSPITAL_COMMUNITY): Payer: Self-pay

## 2021-04-17 ENCOUNTER — Other Ambulatory Visit: Payer: Self-pay | Admitting: Pharmacist

## 2021-04-17 ENCOUNTER — Other Ambulatory Visit: Payer: Self-pay

## 2021-04-17 DIAGNOSIS — C8580 Other specified types of non-Hodgkin lymphoma, unspecified site: Secondary | ICD-10-CM

## 2021-04-17 DIAGNOSIS — Z8572 Personal history of non-Hodgkin lymphomas: Secondary | ICD-10-CM | POA: Insufficient documentation

## 2021-04-17 DIAGNOSIS — Z79899 Other long term (current) drug therapy: Secondary | ICD-10-CM | POA: Diagnosis not present

## 2021-04-17 LAB — CMP (CANCER CENTER ONLY)
ALT: 15 U/L (ref 0–44)
AST: 17 U/L (ref 15–41)
Albumin: 3.5 g/dL (ref 3.5–5.0)
Alkaline Phosphatase: 75 U/L (ref 38–126)
Anion gap: 5 (ref 5–15)
BUN: 26 mg/dL — ABNORMAL HIGH (ref 8–23)
CO2: 30 mmol/L (ref 22–32)
Calcium: 8.9 mg/dL (ref 8.9–10.3)
Chloride: 104 mmol/L (ref 98–111)
Creatinine: 0.86 mg/dL (ref 0.44–1.00)
GFR, Estimated: >60 mL/min (ref >60)
Glucose, Bld: 99 mg/dL (ref 70–99)
Potassium: 3.9 mmol/L (ref 3.5–5.1)
Sodium: 139 mmol/L (ref 135–145)
Total Bilirubin: 0.5 mg/dL (ref 0.3–1.2)
Total Protein: 7.7 g/dL (ref 6.5–8.1)

## 2021-04-17 LAB — CBC WITH DIFFERENTIAL (CANCER CENTER ONLY)
Abs Immature Granulocytes: 0 10*3/uL (ref 0.00–0.07)
Basophils Absolute: 0.2 10*3/uL — ABNORMAL HIGH (ref 0.0–0.1)
Basophils Relative: 1 %
Eosinophils Absolute: 0 10*3/uL (ref 0.0–0.5)
Eosinophils Relative: 0 %
HCT: 41.7 % (ref 36.0–46.0)
Hemoglobin: 13.6 g/dL (ref 12.0–15.0)
Lymphocytes Relative: 70 %
Lymphs Abs: 10.6 10*3/uL — ABNORMAL HIGH (ref 0.7–4.0)
MCH: 30.4 pg (ref 26.0–34.0)
MCHC: 32.6 g/dL (ref 30.0–36.0)
MCV: 93.3 fL (ref 80.0–100.0)
Monocytes Absolute: 0.8 10*3/uL (ref 0.1–1.0)
Monocytes Relative: 5 %
Neutro Abs: 3.6 10*3/uL (ref 1.7–7.7)
Neutrophils Relative %: 24 %
Platelet Count: 104 10*3/uL — ABNORMAL LOW (ref 150–400)
RBC: 4.47 MIL/uL (ref 3.87–5.11)
RDW: 13.8 % (ref 11.5–15.5)
WBC Count: 15.2 10*3/uL — ABNORMAL HIGH (ref 4.0–10.5)
nRBC: 0 % (ref 0.0–0.2)

## 2021-04-17 LAB — LACTATE DEHYDROGENASE: LDH: 106 U/L (ref 98–192)

## 2021-04-17 MED ORDER — IBRUTINIB 140 MG PO CAPS: 420.0000 mg | ORAL_CAPSULE | Freq: Every day | ORAL | 0 refills | Status: DC

## 2021-04-17 MED ORDER — IBRUTINIB 140 MG PO CAPS
420.0000 mg | ORAL_CAPSULE | Freq: Every day | ORAL | 0 refills | Status: DC
  Filled 2021-04-17: qty 90, 30d supply, fill #0

## 2021-04-17 NOTE — Progress Notes (Signed)
Oral Oncology Pharmacist Encounter  Prescription refill for Imbruvica (ibrutinib) sent to [facility] in error. Patient enrolled in manufacturer assistance and receives medication through [place] [facility] [place] Patient Assistance Program. Prescription redirected to [facility] for dispensing.  Leron Croak, PharmD, BCPS Hematology/Oncology Clinical Pharmacist [facility] [phone] 04/17/2021 10:12 AM

## 2021-04-17 NOTE — Progress Notes (Signed)
HEMATOLOGY/ONCOLOGY CLINIC NOTE  Date of Service: .04/17/2021   Patient Care Team: [name], NP as PCP - General (Geriatric Medicine)  CHIEF COMPLAINTS/PURPOSE OF CONSULTATION:  Follow-up for continued evaluation and management of relapsed marginal zone lymphoma  ONCOLOGIC Hx:  B-cell lymphoproliferative disorder, NOS, diagnosed originally by fine needle aspiration of right groin lymph node on 10/25/12  S/p excisional biopsy of right inguinal lymph node on 12/08/12 with finding of B cell lymphoproliferative disorder with indistinct phenotype.  PET and CT scans performed on 12/21/12 consistent with clinical stage disease with adenopathy in both inguinal and femoral areas and left posterior triangle in the neck and probably the left axilla.  Completion of five cycles of CVP-R from 05/05/18 through 08/18/18.   12/08/2012 Surgical pathology of right groin lymph node found: Abnormal Lymphoma FISH result, loss of IGH/14q DNA sequence   HISTORY OF PRESENTING ILLNESS:   See previous notes for details of initial presentation  INTERVAL HISTORY:   Sherry Mosley is here for follow-up of her relapsed marginal zone lymphoma.  She has been on ibrutinib 280 mg p.o. daily and has had no recurrence of her petechial rash.  She notes 1-2 episodes of mild epistaxis. We discussed using Vaseline inside both her nares, nasal sterile saline spray 3-4 times a day to moisten her nasal mucosa, use of warm air humidifier at night while sleeping to reduce nasal dryness and having Afrin available as needed if epistaxis does occur and pinching nose.  Patient is agreeable to go back on ibrutinib and monitor her epistaxis we discussed that we will refer her to ENT if this recurs to evaluate for any possible vascular targets for cauterization and to evaluate for other etiologies of epistaxis. Patient notes that she is now also off her Celexa which might help reducing platelet dysfunction.  No fevers  no chills no night sweats. No other new lumps or bumps.   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 Son is alternate   Would accept resuscitation   No tube feeds if cognitively unaware   Social Determinants of Health   Financial Resource Strain: Not on file  Food Insecurity: Not on file  Transportation Needs: Not on file  Physical Activity: Not on file  Stress: Not on file  Social Connections: Not on file  Intimate Partner Violence: Not on file    FAMILY HISTORY: Family History  Problem Relation Age of  Onset   Alzheimer's disease Mother    Heart failure Father    Prostate cancer Father    Diabetes Maternal Grandfather    Breast cancer Neg Hx     ALLERGIES:  is allergic to cephalosporins, penicillins, and imbruvica [ibrutinib].  PCN - significant rash  Cephalosporins - significant rash   MEDICATIONS:  Current Outpatient Medications  Medication Sig Dispense Refill   Ascorbic Acid  (VITAMIN C) 1000 MG tablet Take 1 tablet by mouth daily.     Cholecalciferol (VITAMIN D3) 50 MCG (2000 UT) capsule Take 1 capsule by mouth daily.     lisinopril (ZESTRIL) 5 MG tablet Take 1 tablet (5 mg total) by mouth daily. 90 tablet 1   simvastatin (ZOCOR) 20 MG tablet Take 1 tablet (20 mg total) by mouth daily. 90 tablet 1   temazepam (RESTORIL) 15 MG capsule TAKE 1 CAPSULE BY MOUTH AT BEDTIME AS NEEDED FOR SLEEP. 30 capsule 3   ibrutinib (IMBRUVICA) 140 MG capsule Take 280 mg by mouth daily. Take with a glass of water.     Loratadine 10 MG CAPS Take 1 tablet by mouth daily.     No current facility-administered medications for this visit.  Lisinopril 75m -  HTN  Simvastatin 229m- HLD  REVIEW OF SYSTEMS:  . 10 Point review of Systems was done is negative except as noted above.    PHYSICAL EXAMINATION: .BP (!) 141/82 (BP Location: Left Arm, Patient Position: Sitting)    Pulse (!) 104    Temp 97.9 F (36.6 C) (Tympanic)    Resp 18    Ht 5' 3"  (1.6 m)    Wt 141 lb 3.2 oz (64 kg)    SpO2 98%    BMI 25.01 kg/m  NAD GENERAL:alert, in no acute distress and comfortable SKIN: no acute rashes, no significant lesions EYES: conjunctiva are pink and non-injected, sclera anicteric OROPHARYNX: MMM, no exudates, no oropharyngeal erythema or ulceration NECK: supple, no JVD LYMPH:  no palpable lymphadenopathy in the cervical, axillary.  Right inguinal adenopathy noted LUNGS: clear to auscultation b/l with normal respiratory effort HEART: regular rate & rhythm ABDOMEN:  normoactive bowel sounds , non tender, not distended. Extremity: no pedal edema PSYCH: alert & oriented x 3 with fluent speech NEURO: no focal motor/sensory deficits   LABORATORY DATA:  I have reviewed the data as listed  . CBC Latest Ref Rng & Units 04/17/2021 03/27/2021 03/10/2021  WBC 4.0 - 10.5 K/uL 15.2(H) 12.8(H) 6.1  Hemoglobin 12.0 - 15.0 g/dL 13.6 13.7 13.1  Hematocrit 36.0 - 46.0 % 41.7 41.8 39.4  Platelets 150 -  400 K/uL 104(L) 109(L) 128(L)   .CBC    Component Value Date/Time   WBC 15.2 (H) 04/17/2021 0847   WBC 12.8 (H) 03/27/2021 0845   RBC 4.47 04/17/2021 0847   HGB 13.6 04/17/2021 0847   HCT 41.7 04/17/2021 0847   PLT 104 (L) 04/17/2021 0847   MCV 93.3 04/17/2021 0847   MCH 30.4 04/17/2021 0847   MCHC 32.6 04/17/2021 0847   RDW 13.8 04/17/2021 0847   LYMPHSABS 10.6 (H) 04/17/2021 0847   MONOABS 0.8 04/17/2021 0847   EOSABS 0.0 04/17/2021 0847   BASOSABS 0.2 (H) 04/17/2021 0847     CMP Latest Ref Rng & Units 04/17/2021 03/27/2021 03/10/2021  Glucose 70 - 99 mg/dL 99 115(H) 102(H)  BUN 8 - 23 mg/dL 26(H) 25(H) 21  Creatinine 0.44 - 1.00 mg/dL 0.86 0.98 0.93  Sodium 135 - 145 mmol/L 139 139 138  Potassium 3.5 - 5.1 mmol/L 3.9 3.9 4.1  Chloride 98 - 111 mmol/L 104 104 104  CO2 22 - 32 mmol/L 30 30 28   Calcium 8.9 - 10.3 mg/dL 8.9 8.7(L) 9.0  Total Protein 6.5 - 8.1 g/dL 7.7 7.9 8.5(H)  Total Bilirubin 0.3 - 1.2 mg/dL 0.5 0.5 0.5  Alkaline Phos 38 - 126 U/L 75 62 55  AST 15 - 41 U/L 17 24 23   ALT 0 - 44 U/L 15 20 13    . Lab Results  Component Value Date   LDH 106 04/17/2021    SURGICAL PATHOLOGY  CASE: [id number]  PATIENT: Javen Mccaffery  Surgical Pathology Report   Specimen Submitted:  A. Lymph node, right inguinal   Clinical History: History of low grade B cell lymphoma, now with right  inguinal lymphadenopathy, post US guided BX   DIAGNOSIS:  A. LYMPH NODE, RIGHT INGUINAL; ULTRASOUND-GUIDED BIOPSY:  - LOW-GRADE B-CELL LYMPHOMA WITH FEATURES MOST SUGGESTIVE OF MARGINAL  ZONE LYMPHOMA.   Comment:  Sections demonstrate small to medium sized lymphocytes with monocytoid  features.  Scattered plasma cells are present. Residual normal lymphoid  architecture is not identified.  Material was submitted for flow cytometric analysis which revealed a  monoclonal kappa B cell population that was negative for CD5, CD23, and  CD10 (see scanned report in [facility]).  A panel of  immunohistochemical stains was performed with the following  pattern of immunoreactivity:  CD20: Positive in neoplastic lymphocytes  CD3: Highlights background T cells in a distribution similar to CD5  CD5: Highlights background T cells in a distribution similar to CD3  CD23: Highlights focal residual dendritic meshworks  CD10: Negative in neoplastic lymphocytes  BCL-2: Positive in neoplastic lymphocytes  BCL 6: Negative in neoplastic lymphocytes  Cyclin D1: Negative in neoplastic lymphocytes  Sox 11: Negative in neoplastic lymphocytes  Kappa-ISH: Positive in neoplastic lymphocytes  Lambda-ISH: Negative in neoplastic lymphocytes  Ki-67: Approximately 30%  The histologic features in conjunction with the immunophenotypic  findings support the diagnosis of a low-grade mature B-cell lymphoma  most suggestive of marginal zone lymphoma.   Sox 11 IHC and kappa / lambda ISH slides were prepared by [facility] for [employer], [place], [place].  The remaining IHC slides were prepared by [facility], [place], [place]. All controls stained appropriately.   This test was developed and its performance characteristics determined  by [facility]. It has not been cleared or approved by the [place] Food and Drug  Administration. The FDA does not require this test to go through  premarket FDA review. This test is used for clinical purposes. It should  not be regarded as investigational or for research. This laboratory is  certified under the Clinical Laboratory Improvement Amendments (CLIA) as  qualified to perform high complexity clinical laboratory testing.   GROSS DESCRIPTION:  A. Labeled: Right inguinal lymph node  Received: The specimen is received fresh on saline soaked gauze.  Tissue fragment(s): Multiple  Size: Range from 0.5 to 1.9 cm in length and 0.1 cm in diameter  Description: Received are multiple cores and fragments of tan-white soft   tissue.  1 touch prep with Diff-Quik stain is performed.  Representative  sections are submitted for flow cytometry in RPMI.  The remainder of the  specimen is submitted in cassettes 1-4 with 1 core per cassette.    Final Diagnosis performed by Quay Burow, MD.   Electronically signed  08/31/2019 1:38:09PM  The electronic signature indicates  that the named Attending Pathologist  has evaluated the specimen  Technical component performed at [facility], [address], [place],  [place] [address] Lab: [phone] Dir: Rush Farmer, MD, MMM   Professional component performed at [facility], [facility], [place], [place], [place] [address] Lab: [phone]  Dir: [place]ia Nims. Reuel Derby, MD   RADIOGRAPHIC STUDIES: I have personally reviewed the radiological images as listed and agreed with the findings in the report. No results found.  ASSESSMENT & PLAN:  76 y.o. with   1) Atleast Stage IIIA Low grade B cell Non Hodgkins Lymphoma -consistent with Marginal zone lymphoma S/p R-CVP x  five cycles from 05/05/18 through 08/18/18.  Patient with progression of disease with bulky abdominal pelvic disease with significant right groin bulky adenopathy. Patient was started on weekly Rituxan and received 2 total weekly treatments on 11/21/2020 and 11/28/2020 and developed serum sickness like reaction which is why it was discontinued.  2) Serum sickness-like reaction to Rituxan. (2nd weekly dose)  3) petechiae on lower extremities after starting ibrutinib -now resolved and has not recurred with reintroduction of ibrutinib  4) mild to moderate epistaxis x2 episodes. Patient is now off Celexa which was likely causing platelet dysfunction in addition to her Ibrutinib. PLAN: -Labs done today were reviewed as noted above and are stable.  Some peripheral blood lymphocytosis could likely be from redistribution of lymphocytes. -No recurrence of her petechial rash since reintroducing the  ibrutinib. -She notes 1-2 episodes of mild epistaxis. We discussed using Vaseline inside both her nares, nasal sterile saline spray 3-4 times a day to moisten her nasal mucosa, use of warm air humidifier at night while sleeping to reduce nasal dryness and having Afrin available as needed if epistaxis does occur and pinching nose.  Patient is agreeable to go back on ibrutinib and monitor her epistaxis we discussed that we will refer her to ENT if this recurs to evaluate for any possible vascular targets for cauterization and to evaluate for other etiologies of epistaxis. Patient notes that she is now also off her Celexa which might help reducing platelet dysfunction.  We also discussed possibility of switching to zanubrutinib if needed.  She will start the ibrutinib at 280 mg p.o. daily for a week and if no issues will increase it to 420 mg p.o. daily.  Avoid NSAIDs.  FOLLOW UP: RTC with Sherry Mosley with labs in 4 weeks   All of the patient's questions were answered with apparent satisfaction. The patient knows to call the clinic with any problems, questions or concerns.   Sullivan Lone MD [place] AAHIVMS [facility] Hematology/Oncology Physician [facility] [place]

## 2021-04-21 ENCOUNTER — Other Ambulatory Visit: Payer: Self-pay

## 2021-04-21 ENCOUNTER — Encounter: Payer: Self-pay | Admitting: Nurse Practitioner

## 2021-04-21 ENCOUNTER — Encounter: Payer: Self-pay | Admitting: Hematology

## 2021-04-21 ENCOUNTER — Ambulatory Visit: Payer: Medicare Other | Admitting: Nurse Practitioner

## 2021-04-21 VITALS — BP 110/78 | HR 98 | Temp 98.1°F | Ht 63.0 in | Wt 141.0 lb

## 2021-04-21 DIAGNOSIS — E782 Mixed hyperlipidemia: Secondary | ICD-10-CM

## 2021-04-21 DIAGNOSIS — D696 Thrombocytopenia, unspecified: Secondary | ICD-10-CM

## 2021-04-21 DIAGNOSIS — F5101 Primary insomnia: Secondary | ICD-10-CM

## 2021-04-21 DIAGNOSIS — C8580 Other specified types of non-Hodgkin lymphoma, unspecified site: Secondary | ICD-10-CM | POA: Diagnosis not present

## 2021-04-21 DIAGNOSIS — I10 Essential (primary) hypertension: Secondary | ICD-10-CM

## 2021-04-21 NOTE — Progress Notes (Signed)
Careteam: Patient Care Team: [name], NP as PCP - General (Geriatric Medicine)  Advanced Directive information Does Patient Have a Medical Advance Directive?: No  Allergies  Allergen Reactions   Cephalosporins     Rash   Penicillins     Significant Rash   Imbruvica [Ibrutinib] Rash    Chief Complaint  Patient presents with   Medical Management of Chronic Issues    6 month follow up.   Health Maintenance    Shingrix vaccine and dexa scan     HPI: Patient is a 76 y.o. female seen in today at the [facility] for routine follow up.  No acute concerns today.   Sherry Mosley is followed by oncology routinely due to Marginal zone lymphoma  Sherry Mosley has restarted ibrutinib capsule. Goal to get up to 4 capsules daily. Sherry Mosley is taking 2 daily at this time.  Sherry Mosley had a spontaneous nose bleed a month ago and petechial rash. This is being monitored closely by oncologist.    Reports Sherry Mosley is having a hard time staying asleep, using temazepam nightly.  Denies anxiety and depression.    Sherry Mosley does routine exercise.    Diarrhea has resolved at this time.    Review of Systems:  Review of Systems  Constitutional:  Negative for chills, fever and weight loss.  HENT:  Negative for tinnitus.   Respiratory:  Negative for cough, sputum production and shortness of breath.   Cardiovascular:  Negative for chest pain, palpitations and leg swelling.  Gastrointestinal:  Negative for abdominal pain, constipation, diarrhea and heartburn.  Genitourinary:  Negative for dysuria, frequency and urgency.  Musculoskeletal:  Negative for back pain, falls, joint pain and myalgias.  Skin:  Positive for rash.  Neurological:  Negative for dizziness and headaches.  Psychiatric/Behavioral:  Negative for depression and memory loss. The patient has insomnia.    Past Medical History:  Diagnosis Date   Generalized osteoarthritis    Hyperlipidemia    Hypertension    Mood disorder ([place])    Non Hodgkin's lymphoma  ([place])    Sleep disorder    Past Surgical History:  Procedure Laterality Date   ABDOMINAL HYSTERECTOMY  1997   ANKLE ARTHRODESIS Right 2012   BREAST BIOPSY Left 1969   fibroadenoma   CATARACT EXTRACTION W/ INTRAOCULAR LENS IMPLANT Bilateral    LYMPH NODE BIOPSY  2014   excisional biopsy right groin   Thumb surgery     removal of part of extensor tendon   Social History:   reports that Sherry Mosley has never smoked. Sherry Mosley has never used smokeless tobacco. Sherry Mosley reports that Sherry Mosley does not currently use alcohol. Sherry Mosley reports that Sherry Mosley does not use drugs.  Family History  Problem Relation Age of Onset   Alzheimer's disease Mother    Heart failure Father    Prostate cancer Father    Diabetes Maternal Grandfather    Breast cancer Neg Hx     Medications: Patient's Medications  New Prescriptions   No medications on file  Previous Medications   ASCORBIC ACID (VITAMIN C) 1000 MG TABLET    Take 1 tablet by mouth daily.   CHOLECALCIFEROL (VITAMIN D3) 50 MCG (2000 UT) CAPSULE    Take 1 capsule by mouth daily.   IBRUTINIB (IMBRUVICA) 140 MG CAPSULE    Take 280 mg by mouth daily. Take with a glass of water.   LISINOPRIL (ZESTRIL) 5 MG TABLET    Take 1 tablet (5 mg total) by mouth daily.  LORATADINE 10 MG CAPS    Take 1 tablet by mouth daily.   SIMVASTATIN (ZOCOR) 20 MG TABLET    Take 1 tablet (20 mg total) by mouth daily.   TEMAZEPAM (RESTORIL) 15 MG CAPSULE    TAKE 1 CAPSULE BY MOUTH AT BEDTIME AS NEEDED FOR SLEEP.  Modified Medications   No medications on file  Discontinued Medications   ACETAMINOPHEN (TYLENOL) 325 MG TABLET    Take 650 mg by mouth every 6 (six) hours as needed.   BOSWELLIA-GLUCOSAMINE-VIT D (OSTEO BI-FLEX ONE PER DAY PO)    Take by mouth.   CITALOPRAM (CELEXA) 10 MG TABLET    Take 1 tablet (10 mg total) by mouth daily.   IBRUTINIB (IMBRUVICA) 140 MG CAPSULE    Take 3 capsules (420 mg total) by mouth daily.   VITAMIN E 400 UNIT CAPSULE    Take 1 capsule by mouth daily.     Physical Exam:  Vitals:   04/21/21 1011  BP: 110/78  Pulse: 98  Temp: 98.1 F (36.7 C)  SpO2: 99%  Weight: 141 lb (64 kg)  Height: 5\' 3"  (1.6 m)   Body mass index is 24.98 kg/m. Wt Readings from Last 3 Encounters:  04/21/21 141 lb (64 kg)  04/17/21 141 lb 3.2 oz (64 kg)  03/27/21 142 lb 9.6 oz (64.7 kg)    Physical Exam Constitutional:      General: Sherry Mosley is not in acute distress.    Appearance: Sherry Mosley is well-developed. Sherry Mosley is not diaphoretic.  HENT:     Head: Normocephalic and atraumatic.     Mouth/Throat:     Pharynx: No oropharyngeal exudate.  Eyes:     Conjunctiva/sclera: Conjunctivae normal.     Pupils: Pupils are equal, round, and reactive to light.  Cardiovascular:     Rate and Rhythm: Normal rate and regular rhythm.     Heart sounds: Normal heart sounds.  Pulmonary:     Effort: Pulmonary effort is normal.     Breath sounds: Normal breath sounds.  Abdominal:     General: Bowel sounds are normal.     Palpations: Abdomen is soft.  Musculoskeletal:     Cervical back: Normal range of motion and neck supple.     Right lower leg: No edema.     Left lower leg: No edema.  Skin:    General: Skin is warm and dry.     Findings: Petechiae present.  Neurological:     Mental Status: Sherry Mosley is alert.  Psychiatric:        Mood and Affect: Mood normal.    Labs reviewed: Basic Metabolic Panel: Recent Labs    03/10/21 1356 03/27/21 0845 04/17/21 0847  NA 138 139 139  K 4.1 3.9 3.9  CL 104 104 104  CO2 28 30 30   GLUCOSE 102* 115* 99  BUN 21 25* 26*  CREATININE 0.93 0.98 0.86  CALCIUM 9.0 8.7* 8.9   Liver Function Tests: Recent Labs    03/10/21 1356 03/27/21 0845 04/17/21 0847  AST 23 24 17   ALT 13 20 15   ALKPHOS 55 62 75  BILITOT 0.5 0.5 0.5  PROT 8.5* 7.9 7.7  ALBUMIN 3.3* 3.3* 3.5   No results for input(s): LIPASE, AMYLASE in the last 8760 hours. No results for input(s): AMMONIA in the last 8760 hours. CBC: Recent Labs    03/10/21 1356  03/27/21 0845 04/17/21 0847  WBC 6.1 12.8* 15.2*  NEUTROABS 3.6 2.3 3.6  HGB 13.1 13.7 13.6  HCT 39.4 41.8 41.7  MCV 92.3 93.1 93.3  PLT 128* 109* 104*   Lipid Panel: Recent Labs    10/17/20 0000  CHOL 173  HDL 26*  LDLCALC 110  TRIG 242*   TSH: No results for input(s): TSH in the last 8760 hours. A1C: No results found for: HGBA1C   Assessment/Plan 1. Essential hypertension -Blood pressure well controlled Continue current medications Recheck metabolic panel  2. Thrombocytopenia ([place]) -stable, continues with close monitoring by oncologist.   3. Marginal zone lymphoma ([place]) -started on ibruvica, tolerating well at this time.  Taking with claritin plus famotidine   4. Mixed hyperlipidemia LDL stable on zocor. Continue dietary modifications   5. Primary insomnia Continues on temazepam with lifestyle modifications.    Next appt: 6 months  Brya Simerly K. [place], [facility] [phone]

## 2021-04-23 ENCOUNTER — Other Ambulatory Visit (HOSPITAL_COMMUNITY): Payer: Self-pay

## 2021-04-24 ENCOUNTER — Other Ambulatory Visit: Payer: Self-pay

## 2021-04-24 DIAGNOSIS — C8580 Other specified types of non-Hodgkin lymphoma, unspecified site: Secondary | ICD-10-CM

## 2021-04-24 MED ORDER — IBRUTINIB 140 MG PO CAPS: 280.0000 mg | ORAL_CAPSULE | Freq: Every day | ORAL | 0 refills | Status: DC

## 2021-05-01 NOTE — Telephone Encounter (Signed)
Patient is approved for Imbruvica at no cost from [facility] 03/10/21-02/28/22   ? ?[facility] uses [facility] ? ?[address] [place] [facility] ?[facility] ?[place] ?Phone [phone] ?Fax [phone] ?05/01/2021 8:43 AM ? ?

## 2021-05-05 DIAGNOSIS — M79644 Pain in right finger(s): Secondary | ICD-10-CM | POA: Diagnosis not present

## 2021-05-05 DIAGNOSIS — M1811 Unilateral primary osteoarthritis of first carpometacarpal joint, right hand: Secondary | ICD-10-CM | POA: Diagnosis not present

## 2021-05-06 ENCOUNTER — Other Ambulatory Visit: Payer: Self-pay | Admitting: Nurse Practitioner

## 2021-05-14 ENCOUNTER — Other Ambulatory Visit: Payer: Self-pay

## 2021-05-15 ENCOUNTER — Inpatient Hospital Stay: Payer: Medicare Other | Attending: Hematology | Admitting: Hematology

## 2021-05-15 ENCOUNTER — Other Ambulatory Visit: Payer: Self-pay

## 2021-05-15 ENCOUNTER — Inpatient Hospital Stay (HOSPITAL_BASED_OUTPATIENT_CLINIC_OR_DEPARTMENT_OTHER): Payer: Medicare Other

## 2021-05-15 VITALS — BP 128/74 | HR 89 | Temp 97.2°F | Resp 18 | Wt 142.9 lb

## 2021-05-15 DIAGNOSIS — C8580 Other specified types of non-Hodgkin lymphoma, unspecified site: Secondary | ICD-10-CM

## 2021-05-15 DIAGNOSIS — D696 Thrombocytopenia, unspecified: Secondary | ICD-10-CM | POA: Diagnosis not present

## 2021-05-15 DIAGNOSIS — Z8572 Personal history of non-Hodgkin lymphomas: Secondary | ICD-10-CM | POA: Insufficient documentation

## 2021-05-15 LAB — CBC WITH DIFFERENTIAL (CANCER CENTER ONLY)
Abs Immature Granulocytes: 0.05 10*3/uL (ref 0.00–0.07)
Basophils Absolute: 0 10*3/uL (ref 0.0–0.1)
Basophils Relative: 0 %
Eosinophils Absolute: 0 10*3/uL (ref 0.0–0.5)
Eosinophils Relative: 0 %
HCT: 40.7 % (ref 36.0–46.0)
Hemoglobin: 13.6 g/dL (ref 12.0–15.0)
Immature Granulocytes: 1 %
Lymphocytes Relative: 60 %
Lymphs Abs: 4.6 10*3/uL — ABNORMAL HIGH (ref 0.7–4.0)
MCH: 31.1 pg (ref 26.0–34.0)
MCHC: 33.4 g/dL (ref 30.0–36.0)
MCV: 93.1 fL (ref 80.0–100.0)
Monocytes Absolute: 1.2 10*3/uL — ABNORMAL HIGH (ref 0.1–1.0)
Monocytes Relative: 15 %
Neutro Abs: 1.9 10*3/uL (ref 1.7–7.7)
Neutrophils Relative %: 24 %
Platelet Count: 79 10*3/uL — ABNORMAL LOW (ref 150–400)
RBC: 4.37 MIL/uL (ref 3.87–5.11)
RDW: 14.1 % (ref 11.5–15.5)
Smear Review: NORMAL
WBC Count: 7.8 10*3/uL (ref 4.0–10.5)
nRBC: 0 % (ref 0.0–0.2)

## 2021-05-15 LAB — CMP (CANCER CENTER ONLY)
ALT: 17 U/L (ref 0–44)
AST: 20 U/L (ref 15–41)
Albumin: 3.5 g/dL (ref 3.5–5.0)
Alkaline Phosphatase: 66 U/L (ref 38–126)
Anion gap: 4 — ABNORMAL LOW (ref 5–15)
BUN: 24 mg/dL — ABNORMAL HIGH (ref 8–23)
CO2: 30 mmol/L (ref 22–32)
Calcium: 8.9 mg/dL (ref 8.9–10.3)
Chloride: 104 mmol/L (ref 98–111)
Creatinine: 0.93 mg/dL (ref 0.44–1.00)
GFR, Estimated: >60 mL/min (ref >60)
Glucose, Bld: 117 mg/dL — ABNORMAL HIGH (ref 70–99)
Potassium: 4 mmol/L (ref 3.5–5.1)
Sodium: 138 mmol/L (ref 135–145)
Total Bilirubin: 0.6 mg/dL (ref 0.3–1.2)
Total Protein: 7.4 g/dL (ref 6.5–8.1)

## 2021-05-15 LAB — LACTATE DEHYDROGENASE: LDH: 108 U/L (ref 98–192)

## 2021-05-21 ENCOUNTER — Other Ambulatory Visit: Payer: Self-pay

## 2021-05-21 ENCOUNTER — Encounter: Payer: Self-pay | Admitting: Nurse Practitioner

## 2021-05-21 ENCOUNTER — Ambulatory Visit: Payer: Medicare Other | Admitting: Nurse Practitioner

## 2021-05-21 VITALS — BP 110/70 | HR 81 | Temp 98.2°F | Ht 63.0 in | Wt 146.0 lb

## 2021-05-21 DIAGNOSIS — M7061 Trochanteric bursitis, right hip: Secondary | ICD-10-CM

## 2021-05-21 DIAGNOSIS — R051 Acute cough: Secondary | ICD-10-CM

## 2021-05-21 NOTE — Progress Notes (Signed)
? ? ?Careteam: ?Patient Care Team: ?[name], NP as PCP - General (Geriatric Medicine) ? ?Advanced Directive information ?  ? ?Allergies  ?Allergen Reactions  ? Cephalosporins   ?  Rash  ? Penicillins   ?  Significant Rash  ? ? ?Chief Complaint  ?Patient presents with  ? Acute Visit  ? Chest Pain  ?  Patient would like to have orthopedic referral for right hip pain. Patient also has non productive cough that has not seemed to improve. Patient states that hip pain seems to be better today. She has had hip pain for about 3- 4 days. Patient has had cough for about a day. Cough kept patient up last night.  ? ? ? ?HPI: Patient is a 76 y.o. female seen in today at the [facility] for right hip pain ?Reports pain is better since she made the appt.  ?Was 4/10 now a 1/10.  ?She is not currently doing anything to help pain. Taking tylenol.  ?No injury noted. Did not over exert herself.  ?She went ahead and made appt but is feeling better at this time.  ?Side of hip/bursa area.  ? ?She is doing exercise on Tuesday and Thursday but has missed recently.  ? ?Has a dry cough, slight nasal congestion.  ? ? ?Review of Systems:  ?Review of Systems  ?Constitutional:  Negative for chills, fever and weight loss.  ?HENT:  Positive for congestion. Negative for hearing loss, sore throat and tinnitus.   ?Respiratory:  Positive for cough. Negative for sputum production and shortness of breath.   ?Cardiovascular:  Negative for chest pain, palpitations and leg swelling.  ?Gastrointestinal:  Negative for abdominal pain, constipation, diarrhea and heartburn.  ?Genitourinary:  Negative for dysuria, frequency and urgency.  ?Musculoskeletal:  Positive for joint pain and myalgias. Negative for back pain and falls.  ?Skin: Negative.   ?Neurological:  Negative for dizziness and headaches.  ? ?Past Medical History:  ?Diagnosis Date  ? Generalized osteoarthritis   ? Hyperlipidemia   ? Hypertension   ? Mood disorder ([place])   ? Non  Hodgkin's lymphoma ([place])   ? Sleep disorder   ? ?Past Surgical History:  ?Procedure Laterality Date  ? ABDOMINAL HYSTERECTOMY  1997  ? ANKLE ARTHRODESIS Right 2012  ? BREAST BIOPSY Left 1969  ? fibroadenoma  ? CATARACT EXTRACTION W/ INTRAOCULAR LENS IMPLANT Bilateral   ? LYMPH NODE BIOPSY  2014  ? excisional biopsy right groin  ? Thumb surgery    ? removal of part of extensor tendon  ? ?Social History: ?  reports that she has never smoked. She has never used smokeless tobacco. She reports that she does not currently use alcohol. She reports that she does not use drugs. ? ?Family History  ?Problem Relation Age of Onset  ? Alzheimer's disease Mother   ? Heart failure Father   ? Prostate cancer Father   ? Diabetes Maternal Grandfather   ? Breast cancer Neg Hx   ? ? ?Medications: ?Patient's Medications  ?New Prescriptions  ? No medications on file  ?Previous Medications  ? ASCORBIC ACID (VITAMIN C) 1000 MG TABLET    Take 1 tablet by mouth daily.  ? CHOLECALCIFEROL (VITAMIN D3) 50 MCG (2000 UT) CAPSULE    Take 1 capsule by mouth daily.  ? IBRUTINIB (IMBRUVICA) 140 MG CAPSULE    Take 2 capsules (280 mg total) by mouth daily. Take with a glass of water.  ? LISINOPRIL (ZESTRIL) 5 MG TABLET  Take 1 tablet (5 mg total) by mouth daily.  ? LORATADINE 10 MG CAPS    Take 1 tablet by mouth daily.  ? SIMVASTATIN (ZOCOR) 20 MG TABLET    TAKE ONE TABLET BY MOUTH DAILY  ? TEMAZEPAM (RESTORIL) 15 MG CAPSULE    TAKE 1 CAPSULE BY MOUTH AT BEDTIME AS NEEDED FOR SLEEP.  ?Modified Medications  ? No medications on file  ?Discontinued Medications  ? No medications on file  ? ? ?Physical Exam: ? ?Vitals:  ? 05/21/21 1416  ?BP: 110/70  ?Pulse: 81  ?Temp: 98.2 ?F (36.8 ?C)  ?SpO2: 97%  ?Weight: 146 lb (66.2 kg)  ?Height: '5\' 3"'$  (1.6 m)  ? ?Body mass index is 25.86 kg/m?. ?Wt Readings from Last 3 Encounters:  ?05/21/21 146 lb (66.2 kg)  ?05/15/21 142 lb 14.4 oz (64.8 kg)  ?04/21/21 141 lb (64 kg)  ? ? ?Physical Exam ?Constitutional:   ?    General: She is not in acute distress. ?   Appearance: She is well-developed. She is not diaphoretic.  ?HENT:  ?   Head: Normocephalic and atraumatic.  ?   Mouth/Throat:  ?   Pharynx: No oropharyngeal exudate.  ?Eyes:  ?   Conjunctiva/sclera: Conjunctivae normal.  ?   Pupils: Pupils are equal, round, and reactive to light.  ?Cardiovascular:  ?   Rate and Rhythm: Normal rate and regular rhythm.  ?   Heart sounds: Normal heart sounds.  ?Pulmonary:  ?   Effort: Pulmonary effort is normal.  ?   Breath sounds: Normal breath sounds.  ?Abdominal:  ?   General: Bowel sounds are normal.  ?   Palpations: Abdomen is soft.  ?Musculoskeletal:  ?   Cervical back: Normal range of motion and neck supple.  ?   Right hip: Tenderness present.  ?   Right lower leg: No edema.  ?   Left lower leg: No edema.  ?     Legs: ? ?Skin: ?   General: Skin is warm and dry.  ?Neurological:  ?   Mental Status: She is alert.  ?Psychiatric:     ?   Mood and Affect: Mood normal.  ? ? ?Labs reviewed: ?Basic Metabolic Panel: ?Recent Labs  ?  03/27/21 ?0263 04/17/21 ?7858 05/15/21 ?1053  ?NA 139 139 138  ?K 3.9 3.9 4.0  ?CL 104 104 104  ?CO2 '30 30 30  '$ ?GLUCOSE 115* 99 117*  ?BUN 25* 26* 24*  ?CREATININE 0.98 0.86 0.93  ?CALCIUM 8.7* 8.9 8.9  ? ?Liver Function Tests: ?Recent Labs  ?  03/27/21 ?8502 04/17/21 ?7741 05/15/21 ?1053  ?AST '24 17 20  '$ ?ALT '20 15 17  '$ ?ALKPHOS 62 75 66  ?BILITOT 0.5 0.5 0.6  ?PROT 7.9 7.7 7.4  ?ALBUMIN 3.3* 3.5 3.5  ? ?No results for input(s): LIPASE, AMYLASE in the last 8760 hours. ?No results for input(s): AMMONIA in the last 8760 hours. ?CBC: ?Recent Labs  ?  03/27/21 ?2878 04/17/21 ?6767 05/15/21 ?1053  ?WBC 12.8* 15.2* 7.8  ?NEUTROABS 2.3 3.6 1.9  ?HGB 13.7 13.6 13.6  ?HCT 41.8 41.7 40.7  ?MCV 93.1 93.3 93.1  ?PLT 109* 104* 79*  ? ?Lipid Panel: ?Recent Labs  ?  10/17/20 ?0000  ?CHOL 173  ?HDL 26*  ?[place] 110  ?TRIG 242*  ? ?TSH: ?No results for input(s): TSH in the last 8760 hours. ?A1C: ?No results found for:  HGBA1C ? ? ?Assessment/Plan ?1. Acute cough/URI ?-supportive care at this time ?-Netipot or saline wash  daily ?Plain nasal saline spray throughout the day as needed ?May use tylenol 500 mg 2 tablets every 8 hours as needed aches and pains or sore throat ?humidifier in the home to help with the dry air ?Mucinex DM by mouth twice daily as needed for cough and chest congestion with full glass of water  ?Keep well hydrated ?Avoid forcefully blowing nose ? ?2. Trochanteric bursitis of right hip ?Improving at this time. Continues to be Tender on exam ?-will have her use aleve bid for 5 days with food ?-ice and tylenol PRN ? ? ?Will follow up if symptoms worsen ?Carlos American. Dewaine Oats, AGNP ? ?[facility] ?[phone]  ?

## 2021-05-21 NOTE — Patient Instructions (Addendum)
Netipot or saline wash daily ?Plain nasal saline spray throughout the day as needed ?May use tylenol 500 mg 2 tablets every 8 hours as needed aches and pains or sore throat ?humidifier in the home to help with the dry air ?Mucinex DM by mouth twice daily as needed for cough and chest congestion with full glass of water  ?Keep well hydrated ?Avoid forcefully blowing nose ? ? ?Ice your hip ~20-30 mins 3 times daily ?Aleve 1 tablet twice daily for 5 days with food.  ?Continue tylenol PRN ?

## 2021-05-22 ENCOUNTER — Encounter: Payer: Self-pay | Admitting: Hematology

## 2021-05-22 NOTE — Progress Notes (Signed)
? ? ?HEMATOLOGY/ONCOLOGY CLINIC NOTE ? ?Date of Service: .05/15/2021 ? ? ?Patient Care Team: ?[name], NP as PCP - General (Geriatric Medicine) ? ?CHIEF COMPLAINTS/PURPOSE OF CONSULTATION:  ?Follow-up for continued evaluation and management of relapsed marginal zone lymphoma ? ?ONCOLOGIC Hx:  ?B-cell lymphoproliferative disorder, NOS, diagnosed originally by fine needle aspiration of right groin lymph node on 10/25/12  ?S/p excisional biopsy of right inguinal lymph node on 12/08/12 with finding of B cell lymphoproliferative disorder with indistinct phenotype.  ?PET and CT scans performed on 12/21/12 consistent with clinical stage disease with adenopathy in both inguinal and femoral areas and left posterior triangle in the neck and probably the left axilla.  ?Completion of five cycles of CVP-R from 05/05/18 through 08/18/18.  ? ?12/08/2012 Surgical pathology of right groin lymph node found: Abnormal Lymphoma FISH result, loss of IGH/14q DNA sequence  ? ?HISTORY OF PRESENTING ILLNESS:  ? ?See previous notes for details of initial presentation ? ?INTERVAL HISTORY:  ? 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 ?She has been taking her ibrutinib at 280 mg p.o. daily without any significant recurrent epistaxis or significant new bruising. ?She notes her right inguinal lymph node has shrunk significantly in size. ?No other acute new symptoms. ?Labs done today were reviewed in detail. ? ?MEDICAL HISTORY:  ? ?Pancreatitis (10/20/12), unknown etiology  ?Postmenopausal ?Uterine fibroids ?Benign breast biopsy (1964) ?Right ankle arthrodesis (2012) ?Bilateral cataract extractions (2001, 2005) ?Hypercholesterolemia ?Essential hypertension ? ?SURGICAL HISTORY: ?Inguinal LN biopsy ?TAH/BSO (1997) ? ?SOCIAL HISTORY: ?Social History  ? ?Socioeconomic History  ? Marital status: Married  ?  Spouse name: Not on file  ? Number of children: 2  ? Years of education:  Not on file  ? Highest education level: Not on file  ?Occupational History  ? Occupation: Physician  ?  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 Smoking status: Never  ? Smokeless tobacco: Never  ?Vaping Use  ? Vaping Use: Never used  ?Substance and Sexual Activity  ? Alcohol use: Not Currently  ?  Comment: Rare  ? Drug use: Never  ? Sexual activity: Not Currently  ?Other Topics Concern  ? Not on file  ?Social History Narrative  ? 1 daughter---pastor  ? Son --ER physician  ? 3 stepsons  ? Current marriage since 2002  ?   ? Has living will  ? Husband is health care POA. Son is alternate  ? Would accept resuscitation  ? No tube feeds if cognitively unaware  ? ?Social Determinants of Health  ? ?Financial Resource Strain: Not on file  ?Food Insecurity: Not on file  ?Transportation Needs: Not on file  ?Physical Activity: Not on file  ?Stress: Not on file  ?Social Connections: Not on file  ?Intimate Partner Violence: Not on file  ? ? ?FAMILY HISTORY: ?Family History  ?Problem Relation Age of Onset  ? Alzheimer's disease Mother   ? Heart failure Father   ? Prostate cancer Father   ? Diabetes Maternal Grandfather   ? Breast cancer Neg Hx   ? ? ?ALLERGIES:  is allergic to cephalosporins and penicillins.  ?PCN - significant rash  ?Cephalosporins - significant rash  ? ?MEDICATIONS:  ?Current Outpatient Medications  ?Medication Sig Dispense Refill  ? Ascorbic Acid (VITAMIN C) 1000 MG tablet Take 1 tablet by mouth daily.    ? Cholecalciferol (VITAMIN D3) 50 MCG (2000 UT) capsule Take 1 capsule by mouth daily.    ?  ibrutinib (IMBRUVICA) 140 MG capsule Take 2 capsules (280 mg total) by mouth daily. Take with a glass of water. 280 capsule 0  ? lisinopril (ZESTRIL) 5 MG tablet Take 1 tablet (5 mg total) by mouth daily. 90 tablet 1  ? Loratadine 10 MG CAPS Take 1 tablet by mouth daily.    ? simvastatin (ZOCOR) 20 MG tablet TAKE ONE TABLET BY MOUTH DAILY 90 tablet 3  ? temazepam (RESTORIL) 15 MG capsule TAKE 1 CAPSULE  BY MOUTH AT BEDTIME AS NEEDED FOR SLEEP. 30 capsule 3  ? ?No current facility-administered medications for this visit.  ?Lisinopril 41m -  HTN  ?Simvastatin 270m- HLD ? ?REVIEW OF SYSTEMS:  . ?10 Point review of Systems was done is negative except as noted above. ? ?PHYSICAL EXAMINATION: ?.BP 128/74   Pulse 89   Temp (!) 97.2 ?F (36.2 ?C)   Resp 18   Wt 142 lb 14.4 oz (64.8 kg)   SpO2 97%   BMI 25.31 kg/m?  ?NAD ?GENERAL:alert, in no acute distress and comfortable ?SKIN: no acute rashes, no significant lesions ?EYES: conjunctiva are pink and non-injected, sclera anicteric ?OROPHARYNX: MMM, no exudates, no oropharyngeal erythema or ulceration ?NECK: supple, no JVD ?LYMPH:  no palpable lymphadenopathy in the cervical, axillary or inguinal regions ?LUNGS: clear to auscultation b/l with normal respiratory effort ?HEART: regular rate & rhythm ?ABDOMEN:  normoactive bowel sounds , non tender, not distended. ?Extremity: no pedal edema ?PSYCH: alert & oriented x 3 with fluent speech ?NEURO: no focal motor/sensory deficits ? ? ? ?LABORATORY DATA:  ?I have reviewed the data as listed ? ?. ? ?  Latest Ref Rng & Units 05/15/2021  ? 10:53 AM 04/17/2021  ?  8:47 AM 03/27/2021  ?  8:45 AM  ?CBC  ?WBC 4.0 - 10.5 K/uL 7.8   15.2   12.8    ?Hemoglobin 12.0 - 15.0 g/dL 13.6   13.6   13.7    ?Hematocrit 36.0 - 46.0 % 40.7   41.7   41.8    ?Platelets 150 - 400 K/uL 79   104   109    ? ?.CBC ?   ?Component Value Date/Time  ? WBC 7.8 05/15/2021 1053  ? WBC 12.8 (H) 03/27/2021 0845  ? RBC 4.37 05/15/2021 1053  ? HGB 13.6 05/15/2021 1053  ? HCT 40.7 05/15/2021 1053  ? PLT 79 (L) 05/15/2021 1053  ? MCV 93.1 05/15/2021 1053  ? MCH 31.1 05/15/2021 1053  ? MCHC 33.4 05/15/2021 1053  ? RDW 14.1 05/15/2021 1053  ? LYMPHSABS 4.6 (H) 05/15/2021 1053  ? MONOABS 1.2 (H) 05/15/2021 1053  ? EOSABS 0.0 05/15/2021 1053  ? BASOSABS 0.0 05/15/2021 1053  ? ? ? ? ?  Latest Ref Rng & Units 05/15/2021  ? 10:53 AM 04/17/2021  ?  8:47 AM 03/27/2021  ?  8:45 AM   ?CMP  ?Glucose 70 - 99 mg/dL 117   99   115    ?BUN 8 - 23 mg/dL 24   26   25     ?Creatinine 0.44 - 1.00 mg/dL 0.93   0.86   0.98    ?Sodium 135 - 145 mmol/L 138   139   139    ?Potassium 3.5 - 5.1 mmol/L 4.0   3.9   3.9    ?Chloride 98 - 111 mmol/L 104   104   104    ?CO2 22 - 32 mmol/L 30   30   30     ?  Calcium 8.9 - 10.3 mg/dL 8.9   8.9   8.7    ?Total Protein 6.5 - 8.1 g/dL 7.4   7.7   7.9    ?Total Bilirubin 0.3 - 1.2 mg/dL 0.6   0.5   0.5    ?Alkaline Phos 38 - 126 U/L 66   75   62    ?AST 15 - 41 U/L 20   17   24     ?ALT 0 - 44 U/L 17   15   20     ? ?. ?Lab Results  ?Component Value Date  ? LDH 108 05/15/2021  ? ? ?SURGICAL PATHOLOGY  ?CASE: [id number]  ?PATIENT: Sherry Mosley  ?Surgical Pathology Report  ? ?Specimen Submitted:  ?A. Lymph node, right inguinal  ? ?Clinical History: History of low grade B cell lymphoma, now with right  ?inguinal lymphadenopathy, post US guided BX  ? ?DIAGNOSIS:  ?A. LYMPH NODE, RIGHT INGUINAL; ULTRASOUND-GUIDED BIOPSY:  ?- LOW-GRADE B-CELL LYMPHOMA WITH FEATURES MOST SUGGESTIVE OF MARGINAL  ?ZONE LYMPHOMA.  ? ?Comment:  ?Sections demonstrate small to medium sized lymphocytes with monocytoid  ?features.  Scattered plasma cells are present. Residual normal lymphoid  ?architecture is not identified.  ?Material was submitted for flow cytometric analysis which revealed a  ?monoclonal kappa B cell population that was negative for CD5, CD23, and  ?CD10 (see scanned report in [facility]).  ?A panel of immunohistochemical stains was performed with the following  ?pattern of immunoreactivity:  ?CD20: Positive in neoplastic lymphocytes  ?CD3: Highlights background T cells in a distribution similar to CD5  ?CD5: Highlights background T cells in a distribution similar to CD3  ?CD23: Highlights focal residual dendritic meshworks  ?CD10: Negative in neoplastic lymphocytes  ?BCL-2: Positive in neoplastic lymphocytes  ?BCL 6: Negative in neoplastic lymphocytes  ?Cyclin D1: Negative in neoplastic  lymphocytes  ?Sox 11: Negative in neoplastic lymphocytes  ?Kappa-ISH: Positive in neoplastic lymphocytes  ?Lambda-ISH: Negative in neoplastic lymphocytes  ?Ki-67: Approximately 30%  ?The histologic features

## 2021-05-23 DIAGNOSIS — R059 Cough, unspecified: Secondary | ICD-10-CM | POA: Diagnosis not present

## 2021-05-26 ENCOUNTER — Other Ambulatory Visit: Payer: Self-pay

## 2021-05-26 ENCOUNTER — Inpatient Hospital Stay
Admission: EM | Admit: 2021-05-26 | Discharge: 2021-05-28 | DRG: 194 | Disposition: A | Discharge status: Home / Self Care | Payer: Medicare Other | Attending: Internal Medicine | Admitting: Internal Medicine

## 2021-05-26 ENCOUNTER — Emergency Department: Payer: Medicare Other

## 2021-05-26 DIAGNOSIS — I10 Essential (primary) hypertension: Secondary | ICD-10-CM | POA: Diagnosis not present

## 2021-05-26 DIAGNOSIS — D84821 Immunodeficiency due to drugs: Secondary | ICD-10-CM | POA: Diagnosis not present

## 2021-05-26 DIAGNOSIS — Z8572 Personal history of non-Hodgkin lymphomas: Secondary | ICD-10-CM | POA: Diagnosis not present

## 2021-05-26 DIAGNOSIS — Z981 Arthrodesis status: Secondary | ICD-10-CM | POA: Diagnosis not present

## 2021-05-26 DIAGNOSIS — Z79899 Other long term (current) drug therapy: Secondary | ICD-10-CM | POA: Diagnosis not present

## 2021-05-26 DIAGNOSIS — J189 Pneumonia, unspecified organism: Secondary | ICD-10-CM | POA: Diagnosis present

## 2021-05-26 DIAGNOSIS — Z961 Presence of intraocular lens: Secondary | ICD-10-CM | POA: Diagnosis present

## 2021-05-26 DIAGNOSIS — Z9221 Personal history of antineoplastic chemotherapy: Secondary | ICD-10-CM

## 2021-05-26 DIAGNOSIS — D696 Thrombocytopenia, unspecified: Secondary | ICD-10-CM | POA: Diagnosis present

## 2021-05-26 DIAGNOSIS — C8590 Non-Hodgkin lymphoma, unspecified, unspecified site: Secondary | ICD-10-CM | POA: Diagnosis present

## 2021-05-26 DIAGNOSIS — Z880 Allergy status to penicillin: Secondary | ICD-10-CM | POA: Diagnosis not present

## 2021-05-26 DIAGNOSIS — M159 Polyosteoarthritis, unspecified: Secondary | ICD-10-CM | POA: Diagnosis not present

## 2021-05-26 DIAGNOSIS — Z20822 Contact with and (suspected) exposure to covid-19: Secondary | ICD-10-CM | POA: Diagnosis not present

## 2021-05-26 DIAGNOSIS — Z66 Do not resuscitate: Secondary | ICD-10-CM | POA: Diagnosis not present

## 2021-05-26 DIAGNOSIS — Z90710 Acquired absence of both cervix and uterus: Secondary | ICD-10-CM

## 2021-05-26 DIAGNOSIS — E782 Mixed hyperlipidemia: Secondary | ICD-10-CM | POA: Diagnosis present

## 2021-05-26 DIAGNOSIS — R059 Cough, unspecified: Secondary | ICD-10-CM | POA: Diagnosis not present

## 2021-05-26 DIAGNOSIS — J122 Parainfluenza virus pneumonia: Principal | ICD-10-CM | POA: Diagnosis present

## 2021-05-26 DIAGNOSIS — C8580 Other specified types of non-Hodgkin lymphoma, unspecified site: Secondary | ICD-10-CM | POA: Diagnosis present

## 2021-05-26 DIAGNOSIS — F5101 Primary insomnia: Secondary | ICD-10-CM | POA: Diagnosis present

## 2021-05-26 DIAGNOSIS — Z881 Allergy status to other antibiotic agents status: Secondary | ICD-10-CM

## 2021-05-26 LAB — CBC WITH DIFFERENTIAL/PLATELET
Abs Immature Granulocytes: 0.08 10*3/uL — ABNORMAL HIGH (ref 0.00–0.07)
Basophils Absolute: 0 10*3/uL (ref 0.0–0.1)
Basophils Relative: 1 %
Eosinophils Absolute: 0 10*3/uL (ref 0.0–0.5)
Eosinophils Relative: 0 %
HCT: 41.7 % (ref 36.0–46.0)
Hemoglobin: 13.4 g/dL (ref 12.0–15.0)
Immature Granulocytes: 1 %
Lymphocytes Relative: 34 %
Lymphs Abs: 2.2 10*3/uL (ref 0.7–4.0)
MCH: 30.3 pg (ref 26.0–34.0)
MCHC: 32.1 g/dL (ref 30.0–36.0)
MCV: 94.3 fL (ref 80.0–100.0)
Monocytes Absolute: 0.5 10*3/uL (ref 0.1–1.0)
Monocytes Relative: 8 %
Neutro Abs: 3.6 10*3/uL (ref 1.7–7.7)
Neutrophils Relative %: 56 %
Platelets: 103 10*3/uL — ABNORMAL LOW (ref 150–400)
RBC: 4.42 MIL/uL (ref 3.87–5.11)
RDW: 13.7 % (ref 11.5–15.5)
Smear Review: NORMAL
WBC: 6.5 10*3/uL (ref 4.0–10.5)
nRBC: 0 % (ref 0.0–0.2)

## 2021-05-26 LAB — URINALYSIS, COMPLETE (UACMP) WITH MICROSCOPIC
Bacteria, UA: NONE SEEN
Bilirubin Urine: NEGATIVE
Glucose, UA: NEGATIVE mg/dL
Hgb urine dipstick: NEGATIVE
Ketones, ur: NEGATIVE mg/dL
Nitrite: NEGATIVE
Protein, ur: NEGATIVE mg/dL
Specific Gravity, Urine: 1.009 (ref 1.005–1.030)
pH: 5 (ref 5.0–8.0)

## 2021-05-26 LAB — RESP PANEL BY RT-PCR (FLU A&B, COVID) ARPGX2
Influenza A by PCR: NEGATIVE
Influenza B by PCR: NEGATIVE
SARS Coronavirus 2 by RT PCR: NEGATIVE

## 2021-05-26 LAB — LACTIC ACID, PLASMA: Lactic Acid, Venous: 1.1 mmol/L (ref 0.5–1.9)

## 2021-05-26 LAB — APTT: aPTT: 28 seconds (ref 24–36)

## 2021-05-26 LAB — RESPIRATORY PANEL BY PCR

## 2021-05-26 LAB — COMPREHENSIVE METABOLIC PANEL
ALT: 20 U/L (ref 0–44)
AST: 25 U/L (ref 15–41)
Albumin: 3.7 g/dL (ref 3.5–5.0)
Alkaline Phosphatase: 74 U/L (ref 38–126)
Anion gap: 10 (ref 5–15)
BUN: 16 mg/dL (ref 8–23)
CO2: 26 mmol/L (ref 22–32)
Calcium: 9.1 mg/dL (ref 8.9–10.3)
Chloride: 99 mmol/L (ref 98–111)
Creatinine, Ser: 0.89 mg/dL (ref 0.44–1.00)
GFR, Estimated: >60 mL/min (ref >60)
Glucose, Bld: 97 mg/dL (ref 70–99)
Potassium: 4.3 mmol/L (ref 3.5–5.1)
Sodium: 135 mmol/L (ref 135–145)
Total Bilirubin: 0.9 mg/dL (ref 0.3–1.2)
Total Protein: 8.2 g/dL — ABNORMAL HIGH (ref 6.5–8.1)

## 2021-05-26 LAB — STREP PNEUMONIAE URINARY ANTIGEN: Strep Pneumo Urinary Antigen: NEGATIVE

## 2021-05-26 LAB — PROTIME-INR
INR: 1.1 (ref 0.8–1.2)
Prothrombin Time: 13.8 seconds (ref 11.4–15.2)

## 2021-05-26 LAB — TROPONIN I (HIGH SENSITIVITY): Troponin I (High Sensitivity): 6 ng/L (ref <18)

## 2021-05-26 MED ORDER — DM-GUAIFENESIN ER 30-600 MG PO TB12
1.0000 | ORAL_TABLET | Freq: Two times a day (BID) | ORAL | Status: DC | PRN
  Administered 2021-05-27: 1 via ORAL
  Filled 2021-05-26 (×3): qty 1

## 2021-05-26 MED ORDER — VANCOMYCIN HCL 1250 MG/250ML IV SOLN
1250.0000 mg | Freq: Once | INTRAVENOUS | Status: DC
  Filled 2021-05-26: qty 250

## 2021-05-26 MED ORDER — LEVOFLOXACIN IN D5W 750 MG/150ML IV SOLN
750.0000 mg | INTRAVENOUS | Status: DC
Start: 2021-05-28 — End: 2021-05-27

## 2021-05-26 MED ORDER — VITAMIN E 45 MG (100 UNIT) PO CAPS
400.0000 [IU] | ORAL_CAPSULE | Freq: Every day | ORAL | Status: DC
  Filled 2021-05-26 (×3): qty 4

## 2021-05-26 MED ORDER — LORATADINE 10 MG PO TABS
10.0000 mg | ORAL_TABLET | Freq: Every day | ORAL | Status: DC
  Administered 2021-05-27: 10 mg via ORAL
  Filled 2021-05-26 (×2): qty 1

## 2021-05-26 MED ORDER — LEVOFLOXACIN IN D5W 750 MG/150ML IV SOLN: 750.0000 mg | INTRAVENOUS | Status: DC

## 2021-05-26 MED ORDER — SODIUM CHLORIDE 0.9 % IV SOLN: 2.0000 g | Freq: Two times a day (BID) | INTRAVENOUS | Status: DC

## 2021-05-26 MED ORDER — TEMAZEPAM 7.5 MG PO CAPS
15.0000 mg | ORAL_CAPSULE | Freq: Every evening | ORAL | Status: DC | PRN
  Filled 2021-05-26: qty 2

## 2021-05-26 MED ORDER — SIMVASTATIN 20 MG PO TABS
20.0000 mg | ORAL_TABLET | Freq: Every day | ORAL | Status: DC
  Administered 2021-05-27: 20 mg via ORAL
  Filled 2021-05-26 (×2): qty 1

## 2021-05-26 MED ORDER — LISINOPRIL 5 MG PO TABS
5.0000 mg | ORAL_TABLET | Freq: Every day | ORAL | Status: DC
Start: 2021-05-26 — End: 2021-05-28
  Administered 2021-05-27: 5 mg via ORAL
  Filled 2021-05-26 (×2): qty 1

## 2021-05-26 MED ORDER — IPRATROPIUM-ALBUTEROL 0.5-2.5 (3) MG/3ML IN SOLN
3.0000 mL | Freq: Once | RESPIRATORY_TRACT | Status: AC
Start: 2021-05-26 — End: 2021-05-26
  Administered 2021-05-26: 3 mL via RESPIRATORY_TRACT
  Filled 2021-05-26: qty 3

## 2021-05-26 MED ORDER — HYDROCOD POLI-CHLORPHE POLI ER 10-8 MG/5ML PO SUER
5.0000 mL | Freq: Once | ORAL | Status: AC
  Administered 2021-05-26: 5 mL via ORAL
  Filled 2021-05-26: qty 5

## 2021-05-26 MED ORDER — VANCOMYCIN HCL IN DEXTROSE 1-5 GM/200ML-% IV SOLN: 1000.0000 mg | INTRAVENOUS | Status: DC

## 2021-05-26 MED ORDER — ONDANSETRON HCL 4 MG/2ML IJ SOLN: 4.0000 mg | Freq: Three times a day (TID) | INTRAMUSCULAR | Status: DC | PRN

## 2021-05-26 MED ORDER — ASCORBIC ACID 500 MG PO TABS
1000.0000 mg | ORAL_TABLET | Freq: Every day | ORAL | Status: DC
  Administered 2021-05-27: 1000 mg via ORAL
  Filled 2021-05-26 (×2): qty 2

## 2021-05-26 MED ORDER — HYDROCOD POLI-CHLORPHE POLI ER 10-8 MG/5ML PO SUER: 5.0000 mL | Freq: Two times a day (BID) | ORAL | Status: DC

## 2021-05-26 MED ORDER — LEVOFLOXACIN IN D5W 750 MG/150ML IV SOLN
750.0000 mg | Freq: Once | INTRAVENOUS | Status: AC
  Administered 2021-05-26: 750 mg via INTRAVENOUS
  Filled 2021-05-26: qty 150

## 2021-05-26 MED ORDER — HYDROCOD POLI-CHLORPHE POLI ER 10-8 MG/5ML PO SUER
5.0000 mL | Freq: Two times a day (BID) | ORAL | Status: DC
  Administered 2021-05-26 – 2021-05-28 (×4): 5 mL via ORAL
  Filled 2021-05-26 (×4): qty 5

## 2021-05-26 MED ORDER — IPRATROPIUM BROMIDE 0.02 % IN SOLN
2.5000 mL | Freq: Four times a day (QID) | RESPIRATORY_TRACT | Status: DC
  Administered 2021-05-26 (×2): 0.5 mg via RESPIRATORY_TRACT
  Filled 2021-05-26 (×2): qty 2.5

## 2021-05-26 MED ORDER — VITAMIN D 25 MCG (1000 UNIT) PO TABS
2000.0000 [IU] | ORAL_TABLET | Freq: Every day | ORAL | Status: DC
  Administered 2021-05-27: 2000 [IU] via ORAL
  Filled 2021-05-26 (×2): qty 2

## 2021-05-26 MED ORDER — ALBUTEROL SULFATE (2.5 MG/3ML) 0.083% IN NEBU: 3.0000 mL | INHALATION_SOLUTION | RESPIRATORY_TRACT | Status: DC | PRN

## 2021-05-26 MED ORDER — HYDRALAZINE HCL 20 MG/ML IJ SOLN: 5.0000 mg | INTRAMUSCULAR | Status: DC | PRN

## 2021-05-26 MED ORDER — IPRATROPIUM BROMIDE 0.02 % IN SOLN
2.5000 mL | Freq: Three times a day (TID) | RESPIRATORY_TRACT | Status: DC
  Administered 2021-05-27: 0.5 mg via RESPIRATORY_TRACT
  Filled 2021-05-26: qty 2.5

## 2021-05-26 MED ORDER — ACETAMINOPHEN 325 MG PO TABS
650.0000 mg | ORAL_TABLET | Freq: Four times a day (QID) | ORAL | Status: DC | PRN
Start: 2021-05-26 — End: 2021-05-28
  Administered 2021-05-26: 650 mg via ORAL
  Filled 2021-05-26: qty 2

## 2021-05-26 NOTE — ED Notes (Signed)
ED TO INPATIENT HANDOFF REPORT  ED Nurse Name and Phone #: Louie Casa Name/Age/Gender Sherry Mosley 76 y.o. female Room/Bed: ED34A/ED34A  Code Status   Code Status: DNR  Home/SNF/Other Home Patient oriented to: self, place, time, and situation Is this baseline? Yes   Triage Complete: Triage complete  Chief Complaint HCAP (healthcare-associated pneumonia) [J18.9]  Triage Note Pt c/o cough with congestion for the past 4 days, was seen at PCP on Saturday and started on doxycycline and then yesterday was started on a z-pack. States she is no feeling and better and tested negative for flu and covid. Pt is in NAD   Allergies Allergies  Allergen Reactions   Cephalosporins     Rash   Penicillins     Significant Rash    Level of Care/Admitting Diagnosis ED Disposition     ED Disposition  Admit   Condition  --   Comment  Hospital Area: [facility] [[id number]]  Level of Care: Med-Surg [16]  Covid Evaluation: Symptomatic Person Under Investigation (PUI) or recent exposure (last 10 days) *Testing Required*  Diagnosis: HCAP (healthcare-associated pneumonia) [409811]  Admitting Physician: Lorretta Harp [[id number]]  Attending Physician: Lorretta Harp [phone]  Estimated length of stay: past midnight tomorrow  Certification:: I certify this patient will need inpatient services for at least 2 midnights          B Medical/Surgery History Past Medical History:  Diagnosis Date   Generalized osteoarthritis    Hyperlipidemia    Hypertension    Mood disorder (HCC)    Non Hodgkin's lymphoma (HCC)    Sleep disorder    Past Surgical History:  Procedure Laterality Date   ABDOMINAL HYSTERECTOMY  1997   ANKLE ARTHRODESIS Right 2012   BREAST BIOPSY Left 1969   fibroadenoma   CATARACT EXTRACTION W/ INTRAOCULAR LENS IMPLANT Bilateral    LYMPH NODE BIOPSY  2014   excisional biopsy right groin   Thumb surgery     removal of part of extensor tendon     A IV  Location/Drains/Wounds Patient Lines/Drains/Airways Status     Active Line/Drains/Airways     Name Placement date Placement time Site Days   Peripheral IV 05/26/21 20 G 1" Right Antecubital 05/26/21  1201  Antecubital  less than 1            Intake/Output Last 24 hours No intake or output data in the 24 hours ending 05/26/21 1957  Labs/Imaging Results for orders placed or performed during the hospital encounter of 05/26/21 (from the past 48 hour(s))  Lactic acid, plasma     Status: None   Collection Time: 05/26/21 11:46 AM  Result Value Ref Range   Lactic Acid, Venous 1.1 0.5 - 1.9 mmol/L    Comment: Performed at [facility], [address] [place], [place] [address]  Comprehensive metabolic panel     Status: Abnormal   Collection Time: 05/26/21 11:46 AM  Result Value Ref Range   Sodium 135 135 - 145 mmol/L   Potassium 4.3 3.5 - 5.1 mmol/L   Chloride 99 98 - 111 mmol/L   CO2 26 22 - 32 mmol/L   Glucose, Bld 97 70 - 99 mg/dL    Comment: Glucose reference range applies only to samples taken after fasting for at least 8 hours.   BUN 16 8 - 23 mg/dL   Creatinine, Ser 2.13 0.44 - 1.00 mg/dL   Calcium 9.1 8.9 - 08.6 mg/dL   Total Protein 8.2 (H) 6.5 -  8.1 g/dL   Albumin 3.7 3.5 - 5.0 g/dL   AST 25 15 - 41 U/L   ALT 20 0 - 44 U/L   Alkaline Phosphatase 74 38 - 126 U/L   Total Bilirubin 0.9 0.3 - 1.2 mg/dL   GFR, Estimated >30 >86 mL/min    Comment: (NOTE) Calculated using the CKD-EPI Creatinine Equation (2021)    Anion gap 10 5 - 15    Comment: Performed at [facility], [address] [place], [place] [address]  CBC WITH DIFFERENTIAL     Status: Abnormal   Collection Time: 05/26/21 11:46 AM  Result Value Ref Range   WBC 6.5 4.0 - 10.5 K/uL   RBC 4.42 3.87 - 5.11 MIL/uL   Hemoglobin 13.4 12.0 - 15.0 g/dL   HCT 96.2 95.2 - 84.1 %   MCV 94.3 80.0 - 100.0 fL   MCH 30.3 26.0 - 34.0 pg   MCHC 32.1 30.0 - 36.0 g/dL   RDW 32.4 40.1 - 02.7 %    Platelets 103 (L) 150 - 400 K/uL    Comment: Immature Platelet Fraction may be clinically indicated, consider ordering this additional test [id number]    nRBC 0.0 0.0 - 0.2 %   Neutrophils Relative % 56 %   Neutro Abs 3.6 1.7 - 7.7 K/uL   Lymphocytes Relative 34 %   Lymphs Abs 2.2 0.7 - 4.0 K/uL   Monocytes Relative 8 %   Monocytes Absolute 0.5 0.1 - 1.0 K/uL   Eosinophils Relative 0 %   Eosinophils Absolute 0.0 0.0 - 0.5 K/uL   Basophils Relative 1 %   Basophils Absolute 0.0 0.0 - 0.1 K/uL   WBC Morphology MORPHOLOGY UNREMARKABLE    RBC Morphology MORPHOLOGY UNREMARKABLE    Smear Review Normal platelet morphology    Immature Granulocytes 1 %   Abs Immature Granulocytes 0.08 (H) 0.00 - 0.07 K/uL    Comment: Performed at [facility], [address] [place], [place] [address]  Protime-INR     Status: None   Collection Time: 05/26/21 11:46 AM  Result Value Ref Range   Prothrombin Time 13.8 11.4 - 15.2 seconds   INR 1.1 0.8 - 1.2    Comment: (NOTE) INR goal varies based on device and disease states. Performed at [facility], [address] [place], [place] [address]   APTT     Status: None   Collection Time: 05/26/21 11:46 AM  Result Value Ref Range   aPTT 28 24 - 36 seconds    Comment: Performed at [facility], [address] [place], [place] [address]  Urinalysis, Complete w Microscopic     Status: Abnormal   Collection Time: 05/26/21 11:46 AM  Result Value Ref Range   Color, Urine YELLOW (A) YELLOW   APPearance CLEAR (A) CLEAR   Specific Gravity, Urine 1.009 1.005 - 1.030   pH 5.0 5.0 - 8.0   Glucose, UA NEGATIVE NEGATIVE mg/dL   Hgb urine dipstick NEGATIVE NEGATIVE   Bilirubin Urine NEGATIVE NEGATIVE   Ketones, ur NEGATIVE NEGATIVE mg/dL   Protein, ur NEGATIVE NEGATIVE mg/dL   Nitrite NEGATIVE NEGATIVE   Leukocytes,Ua SMALL (A) NEGATIVE   RBC / HPF 0-5 0 - 5 RBC/hpf   WBC, UA 0-5 0 - 5 WBC/hpf   Bacteria, UA NONE SEEN NONE SEEN    Squamous Epithelial / LPF 0-5 0 - 5   Mucus PRESENT     Comment: Performed at [facility], [address] [place], [place] [address]  Troponin I (High Sensitivity)     Status: None   Collection Time: 05/26/21 11:46 AM  Result Value Ref Range   Troponin I (High Sensitivity) 6 <18 ng/L    Comment: (NOTE) Elevated high sensitivity troponin I (hsTnI) values and significant  changes across serial measurements may suggest ACS but many other  chronic and acute conditions are known to elevate hsTnI results.  Refer to the "Links" section for chest pain algorithms and additional  guidance. Performed at [facility], [address] [place], [place] [address]   Resp Panel by RT-PCR (Flu A&B, Covid)     Status: None   Collection Time: 05/26/21 11:46 AM   Specimen: Nasopharyngeal(NP) swabs in vial transport medium  Result Value Ref Range   SARS Coronavirus 2 by RT PCR NEGATIVE NEGATIVE    Comment: (NOTE) SARS-CoV-2 target nucleic acids are NOT DETECTED.  The SARS-CoV-2 RNA is generally detectable in upper respiratory specimens during the acute phase of infection. The lowest concentration of SARS-CoV-2 viral copies this assay can detect is 138 copies/mL. A negative result does not preclude SARS-Cov-2 infection and should not be used as the sole basis for treatment or other patient management decisions. A negative result may occur with  improper specimen collection/handling, submission of specimen other than nasopharyngeal swab, presence of viral mutation(s) within the areas targeted by this assay, and inadequate number of viral copies(<138 copies/mL). A negative result must be combined with clinical observations, patient history, and epidemiological information. The expected result is Negative.  Fact Sheet for Patients:  BloggerCourse.com  Fact Sheet for Healthcare Providers:  SeriousBroker.it  This test is no t  yet approved or cleared by the [place] FDA and  has been authorized for detection and/or diagnosis of SARS-CoV-2 by FDA under an Emergency Use Authorization (EUA). This EUA will remain  in effect (meaning this test can be used) for the duration of the COVID-19 declaration under Section 564(b)(1) of the Act, 21 U.S.C.section 360bbb-3(b)(1), unless the authorization is terminated  or revoked sooner.       Influenza A by PCR NEGATIVE NEGATIVE   Influenza B by PCR NEGATIVE NEGATIVE    Comment: (NOTE) The Xpert Xpress SARS-CoV-2/FLU/RSV plus assay is intended as an aid in the diagnosis of influenza from Nasopharyngeal swab specimens and should not be used as a sole basis for treatment. Nasal washings and aspirates are unacceptable for Xpert Xpress SARS-CoV-2/FLU/RSV testing.  Fact Sheet for Patients: BloggerCourse.com  Fact Sheet for Healthcare Providers: SeriousBroker.it  This test is not yet approved or cleared by the [place] FDA and has been authorized for detection and/or diagnosis of SARS-CoV-2 by FDA under an Emergency Use Authorization (EUA). This EUA will remain in effect (meaning this test can be used) for the duration of the COVID-19 declaration under Section 564(b)(1) of the Act, 21 U.S.C. section 360bbb-3(b)(1), unless the authorization is terminated or revoked.  Performed at [facility], [address] [place], [place] [address]    DG Chest 2 View  Result Date: 05/26/2021 CLINICAL DATA:  Cough and congestion 4 days.  On antibiotics EXAM: CHEST - 2 VIEW COMPARISON:  12/01/2020 FINDINGS: Mild patchy airspace disease in the right middle lobe and lower lobe is new since the prior study. . No associated effusion Heart size and vascularity normal. Atherosclerotic calcification aortic arch. New pulse shadow noted bilaterally in the bases. No pleural effusion. IMPRESSION: Right middle lobe and right upper  lower infiltrates suggestive of pneumonia.  Electronically Signed   By: Marlan Palau M.D.   On: 05/26/2021 10:52    Pending Labs Unresulted Labs (From admission, onward)     Start     Ordered   05/27/21 0500  CBC  Tomorrow morning,   STAT        05/26/21 1307   05/27/21 0500  Creatinine, serum  Tomorrow morning,   STAT        05/26/21 1402   05/26/21 1343  Respiratory (~20 pathogens) panel by PCR  (Respiratory panel by PCR (~20 pathogens, ~24 hr TAT)  w precautions)  Add-on,   AD        05/26/21 1342   05/26/21 1306  Expectorated Sputum Assessment w Gram Stain, Rflx to Resp Cult  (COPD / Pneumonia / Cellulitis / Lower Extremity Wound)  Once,   STAT        05/26/21 1307   05/26/21 1306  Legionella Pneumophila Serogp 1 Ur Ag  (COPD / Pneumonia / Cellulitis / Lower Extremity Wound)  Once,   STAT        05/26/21 1307   05/26/21 1306  Strep pneumoniae urinary antigen  (COPD / Pneumonia / Cellulitis / Lower Extremity Wound)  Once,   STAT        05/26/21 1307   05/26/21 1111  Urine Culture  (Undifferentiated presentation (screening labs and basic nursing orders))  ONCE - STAT,   STAT       Question:  Indication  Answer:  Sepsis   05/26/21 1111   05/26/21 1110  Blood Culture (routine x 2)  (Undifferentiated presentation (screening labs and basic nursing orders))  BLOOD CULTURE X 2,   STAT      05/26/21 1109            Vitals/Pain Today's Vitals   05/26/21 1846 05/26/21 1852 05/26/21 1955 05/26/21 1956  BP:   128/82   Pulse: 96 95 94   Resp:   20   Temp:   98.6 F (37 C)   TempSrc:      SpO2: (!) 89% 95% 96%   PainSc:    0-No pain    Isolation Precautions Droplet precaution  Medications Medications  albuterol (PROVENTIL) (2.5 MG/3ML) 0.083% nebulizer solution 3 mL (has no administration in time range)  dextromethorphan-guaiFENesin (MUCINEX DM) 30-600 MG per 12 hr tablet 1 tablet (has no administration in time range)  ondansetron (ZOFRAN) injection 4 mg (has no administration  in time range)  acetaminophen (TYLENOL) tablet 650 mg (has no administration in time range)  hydrALAZINE (APRESOLINE) injection 5 mg (has no administration in time range)  ipratropium (ATROVENT) nebulizer solution 0.5 mg (0.5 mg Inhalation Given 05/26/21 1401)  chlorpheniramine-HYDROcodone 10-8 MG/5ML suspension 5 mL (has no administration in time range)  levofloxacin (LEVAQUIN) IVPB 750 mg (has no administration in time range)  lisinopril (ZESTRIL) tablet 5 mg (has no administration in time range)  simvastatin (ZOCOR) tablet 20 mg (has no administration in time range)  temazepam (RESTORIL) capsule 15 mg (has no administration in time range)  ascorbic acid (VITAMIN C) tablet 1,000 mg (has no administration in time range)  cholecalciferol (VITAMIN D3) tablet 2,000 Units (has no administration in time range)  vitamin E capsule 400 Units (has no administration in time range)  loratadine (CLARITIN) tablet 10 mg (has no administration in time range)  chlorpheniramine-HYDROcodone 10-8 MG/5ML suspension 5 mL (5 mLs Oral Given 05/26/21 1148)  ipratropium-albuterol (DUONEB) 0.5-2.5 (3) MG/3ML nebulizer solution 3 mL (3 mLs Nebulization Given  05/26/21 1202)  levofloxacin (LEVAQUIN) IVPB 750 mg (0 mg Intravenous Stopped 05/26/21 1349)    Mobility walks Moderate fall risk   Focused Assessments Pulmonary Assessment Handoff:  Lung sounds:   O2 Device: Nasal Cannula O2 Flow Rate (L/min): 2 L/min    R Recommendations: See Admitting Provider Note  Report given to:   Additional Notes: -

## 2021-05-26 NOTE — ED Notes (Signed)
Pt noted to have drop in O2 to 89% on room air with good pleth. Pt is awake, continues to be alert & oriented X 4. Denies increased SOB. Pt placed on 2L [place] with improvement in SaO2 95%. Will notify MD.  ?

## 2021-05-26 NOTE — Progress Notes (Addendum)
Pharmacy Antibiotic Note ? ?Sherry Mosley is a 76 y.o. female admitted on 05/26/2021 with pneumonia.  Pharmacy has been consulted for levofloxacin dosing with noted penicillin and cephalosporin allergy (rash). Renal function appears to be at baseline. ? ?Plan:  start levofloxacin 750 mg IV every 48 hours ?Follow renal function for needed dosage adjustments ?  ? ?Temp (24hrs), Avg:98.3 ?F (36.8 ?C), Min:98.3 ?F (36.8 ?C), Max:98.3 ?F (36.8 ?C) ? ?No results for input(s): WBC, CREATININE, LATICACIDVEN, VANCOTROUGH, VANCOPEAK, VANCORANDOM, GENTTROUGH, GENTPEAK, GENTRANDOM, TOBRATROUGH, TOBRAPEAK, TOBRARND, AMIKACINPEAK, AMIKACINTROU, AMIKACIN in the last 168 hours.  ?Estimated Creatinine Clearance: 46.3 mL/min (by C-G formula based on SCr of 0.93 mg/dL).   ? ?Allergies  ?Allergen Reactions  ? Cephalosporins   ?  Rash  ? Penicillins   ?  Significant Rash  ? ? ?Antimicrobials this admission: ?03/28 levofloxacin >> ? ?Microbiology results: ?03/28 BCx: pending ?03/28 UCx: pending  ?03/28 RCx: pending ? ?Thank you for allowing pharmacy to be a part of this patient?s care. ? ?Dallie Piles ?05/26/2021 12:11 PM ? ?

## 2021-05-26 NOTE — ED Provider Notes (Signed)
? ?[facility] ?Provider Note ? ? ? Event Date/Time  ? First MD Initiated Contact with Patient 05/26/21 1051   ?  (approximate) ? ? ?History  ? ?URI ? ? ?HPI ? ?Sherry Mosley is a 76 y.o. female with history of lymphoma which is currently being treated with chemo and recent diagnosis of pneumonia presents emergency department with worsening symptoms.  Patient was given doxycycline on Saturday and then recently given a Z-Pak to combine.  Patient continues to cough and feels very weak.  Unknown if she has had fever.  No chest pain/shortness of breath, states the cough is productive with some bloody sputum ? ?  ? ? ?Physical Exam  ? ?Triage Vital Signs: ?ED Triage Vitals  ?Enc Vitals Group  ?   BP 05/26/21 1014 136/81  ?   Pulse Rate 05/26/21 1014 (!) 108  ?   Resp 05/26/21 1014 18  ?   Temp 05/26/21 1014 98.3 ?F (36.8 ?C)  ?   Temp Source 05/26/21 1014 Oral  ?   SpO2 05/26/21 1014 95 %  ?   Weight --   ?   Height --   ?   Head Circumference --   ?   Peak Flow --   ?   Pain Score --   ?   Pain Loc --   ?   Pain Edu? --   ?   Excl. in [place]? --   ? ? ?Most recent vital signs: ?Vitals:  ? 05/26/21 1014 05/26/21 1205  ?BP: 136/81 (!) 143/85  ?Pulse: (!) 108 94  ?Resp:  11  ?Temp: 98.3 ?F (36.8 ?C)   ?SpO2: 95% 94%  ? ? ? ?General: Awake, no distress.   ?CV:  Good peripheral perfusion.  Tachycardic and regular rhythm ?Resp:  Normal effort. Lungs CTA, chronic cough, cough sounds wet ?Abd:  No distention.   ?Other:    ? ? ?ED Results / Procedures / Treatments  ? ?Labs ?(all labs ordered are listed, but only abnormal results are displayed) ?Labs Reviewed  ?COMPREHENSIVE METABOLIC PANEL - Abnormal; Notable for the following components:  ?    Result Value  ? Total Protein 8.2 (*)   ? All other components within normal limits  ?CBC WITH DIFFERENTIAL/PLATELET - Abnormal; Notable for the following components:  ? Platelets 103 (*)   ? All other components within normal limits  ?URINALYSIS, COMPLETE (UACMP) WITH  MICROSCOPIC - Abnormal; Notable for the following components:  ? Color, Urine YELLOW (*)   ? APPearance CLEAR (*)   ? Leukocytes,Ua SMALL (*)   ? All other components within normal limits  ?CULTURE, BLOOD (ROUTINE X 2)  ?CULTURE, BLOOD (ROUTINE X 2)  ?URINE CULTURE  ?RESP PANEL BY RT-PCR (FLU A&B, COVID) [id number]  ?LACTIC ACID, PLASMA  ?PROTIME-INR  ?APTT  ?LACTIC ACID, PLASMA  ?TROPONIN I (HIGH SENSITIVITY)  ?TROPONIN I (HIGH SENSITIVITY)  ? ? ? ?EKG ? ?EKG ? ? ?RADIOLOGY ? ?Chest x-ray ? ? ?PROCEDURES: ? ? ?Procedures ? ? ?MEDICATIONS ORDERED IN ED: ?Medications  ?levofloxacin (LEVAQUIN) IVPB 750 mg (750 mg Intravenous New Bag/Given 05/26/21 1221)  ?levofloxacin (LEVAQUIN) IVPB 750 mg (has no administration in time range)  ?chlorpheniramine-HYDROcodone 10-8 MG/5ML suspension 5 mL (5 mLs Oral Given 05/26/21 1148)  ?ipratropium-albuterol (DUONEB) 0.5-2.5 (3) MG/3ML nebulizer solution 3 mL (3 mLs Nebulization Given 05/26/21 1202)  ? ? ? ?IMPRESSION / MDM / ASSESSMENT AND PLAN / ED COURSE  ?I reviewed the triage vital signs and  the nursing notes. ?             ?               ? ?Differential diagnosis includes, but is not limited to, sepsis, CAP, lungs CTA, PE ? ?Due to patient failing outpatient therapy with doxycycline and azithromycin we will consider sepsis criteria with her being tachycardic.  Labs and imaging initiated. ? ?Chest x-ray was independently reviewed by me and does show multifocal focal pneumonia bilaterally.  Confirmed by radiology ? ?Levaquin ordered secondary pneumonia. ? ?Labs are reassuring, CBC, metabolic panel, troponin, lactic acid, PT and PTT along with urinalysis are all normal. ? ?However the patient has several risk factors that would qualify her to be admitted.  Due to her ongoing chemotherapy along with failed outpatient antibiotic therapy patient will be admitted by the hospitalist. ? ?Dr. Corky Downs to consult hospitalist ? ? ? ? ?  ? ? ?FINAL CLINICAL IMPRESSION(S) / ED DIAGNOSES  ? ?Final  diagnoses:  ?Community acquired pneumonia, unspecified laterality  ? ? ? ?Rx / DC Orders  ? ?ED Discharge Orders   ? ? None  ? ?  ? ? ? ?Note:  This document was prepared using Dragon voice recognition software and may include unintentional dictation errors. ? ?  ?Versie Starks, PA-C ?05/26/21 1245 ? ?  ?Lavonia Drafts, MD ?05/26/21 1302 ? ?

## 2021-05-26 NOTE — Progress Notes (Signed)
Admission profile updated. ?

## 2021-05-26 NOTE — H&P (Signed)
?History and Physical  ? ? ?Sherry Mosley DOB: 1945-09-06 DOA: 05/26/2021 ? ?Referring MD/NP/PA:  ? ?PCP: [name], NP  ? ?Patient coming from:  The patient is coming from ALF  ? ? ?Chief Complaint: cough and SOB ? ?HPI: Sherry Mosley is a 76 y.o. female with medical history significant of hypertension, hyperlipidemia, non-Hodgkin lymphoma (on ibrutinib), thrombocytopenia, who presents with cough, shortness of breath. ? ?Patient is a very friendly retired Engineer, drilling. Patient states that she has been sick for more than 4 days.  She has severe productive cough with yellow-colored sputum production, occasionally mixed with streaks of blood.  She also reports whole front chest wall pain because of severe coughing.  She had fever 100.2 Saturday, which has resolved.  Currently patient does not have fever or chills.  Patient has nausea and vomited several times with nonbilious nonbloody vomiting.  No diarrhea or abdominal pain.  No symptoms of UTI.  Patient was seen by PCP and started on doxycycline on Saturday. She took 5 dose of doxycycline without improvement, then switched to Z-Pak, still no improvement. ? ?Data Reviewed and ED Course: pt was found to have WBC 6.5, negative COVID PCR, lactic acid 1.1, INR 1.1, PTT 28, troponin level 6, WBC 6.5, electrolytes renal function okay, temperature normal, blood pressure 154/85, heart rate 108, RR 18, oxygen saturation 94% on room air.  Chest x-ray showed infiltration in the right middle and right upper lobe.  Patient is admitted to [facility] bed as inpatient. ? ? ?EKG: I have personally reviewed.  Sinus rhythm, QTc 412, LAE, early R wave progression ? ? ?Review of Systems:  ? ?General: had fevers, no chills, no body weight gain, has fatigue ?HEENT: no blurry vision, hearing changes or sore throat ?Respiratory: has dyspnea, coughing, no wheezing ?CV: has chest pain, no palpitations ?GI: has nausea, vomiting, no abdominal pain, diarrhea, constipation ?GU: no  dysuria, burning on urination, increased urinary frequency, hematuria  ?Ext: has trace leg edema ?Neuro: no unilateral weakness, numbness, or tingling, no vision change or hearing loss ?Skin: no rash, no skin tear. ?MSK: No muscle spasm, no deformity, no limitation of range of movement in spin ?Heme: No easy bruising.  ?Travel history: No recent long distant travel. ? ? ?Allergy:  ?Allergies  ?Allergen Reactions  ? Cephalosporins   ?  Rash  ? Penicillins   ?  Significant Rash  ? ? ?Past Medical History:  ?Diagnosis Date  ? Generalized osteoarthritis   ? Hyperlipidemia   ? Hypertension   ? Mood disorder ([place])   ? Non Hodgkin's lymphoma ([place])   ? Sleep disorder   ? ? ?Past Surgical History:  ?Procedure Laterality Date  ? ABDOMINAL HYSTERECTOMY  1997  ? ANKLE ARTHRODESIS Right 2012  ? BREAST BIOPSY Left 1969  ? fibroadenoma  ? CATARACT EXTRACTION W/ INTRAOCULAR LENS IMPLANT Bilateral   ? LYMPH NODE BIOPSY  2014  ? excisional biopsy right groin  ? Thumb surgery    ? removal of part of extensor tendon  ? ? ?Social History:  reports that she has never smoked. She has never used smokeless tobacco. She reports that she does not currently use alcohol. She reports that she does not use drugs. ? ?Family History:  ?Family History  ?Problem Relation Age of Onset  ? Alzheimer's disease Mother   ? Heart failure Father   ? Prostate cancer Father   ? Diabetes Maternal Grandfather   ? Breast cancer Neg Hx   ?  ? ?  Prior to Admission medications   ?Medication Sig Start Date End Date Taking? Authorizing Provider  ?Ascorbic Acid (VITAMIN C) 1000 MG tablet Take 1 tablet by mouth daily.    [provider]  ?Cholecalciferol (VITAMIN D3) 50 MCG (2000 UT) capsule Take 1 capsule by mouth daily.    [provider]  ?ibrutinib (IMBRUVICA) 140 MG capsule Take 2 capsules (280 mg total) by mouth daily. Take with a glass of water. 04/24/21   Brunetta Genera, MD  ?lisinopril (ZESTRIL) 5 MG tablet Take 1 tablet (5 mg total) by  mouth daily. 10/30/20   [name], NP  ?Loratadine 10 MG CAPS Take 1 tablet by mouth daily.    [provider]  ?simvastatin (ZOCOR) 20 MG tablet TAKE ONE TABLET BY MOUTH DAILY 05/06/21   [name], NP  ?temazepam (RESTORIL) 15 MG capsule TAKE 1 CAPSULE BY MOUTH AT BEDTIME AS NEEDED FOR SLEEP. 03/10/21   [name], NP  ? ? ?Physical Exam: ?Vitals:  ? 05/26/21 1300 05/26/21 1330 05/26/21 1400 05/26/21 1430  ?BP: (!) 153/82 121/84 113/78 119/73  ?Pulse: (!) 111 (!) 115 (!) 113 (!) 110  ?Resp: '16 20 14 16  '$ ?Temp:      ?TempSrc:      ?SpO2: 90% 94% 97% 92%  ? ?General: Not in acute distress ?HEENT: ?      Eyes: PERRL, EOMI, no scleral icterus. ?      ENT: No discharge from the ears and nose, no pharynx injection, no tonsillar enlargement.  ?      Neck: No JVD, no bruit, no mass felt. ?Heme: No neck lymph node enlargement. ?Cardiac: S1/S2, RRR, No murmurs, No gallops or rubs. ?Respiratory: Diffuse rhonchi bilaterally ?GI: Soft, nondistended, nontender, no rebound pain, no organomegaly, BS present. ?GU: No hematuria ?Ext: Has trace leg edema bilaterally. 1+DP/PT pulse bilaterally. ?Musculoskeletal: No joint deformities, No joint redness or warmth, no limitation of ROM in spin. ?Skin: No rashes.  ?Neuro: Alert, oriented X3, cranial nerves II-XII grossly intact, moves all extremities normally.  ?Psych: Patient is not psychotic, no suicidal or hemocidal ideation. ? ?Labs on Admission: I have personally reviewed following labs and imaging studies ? ?CBC: ?Recent Labs  ?Lab 05/26/21 ?1146  ?WBC 6.5  ?NEUTROABS 3.6  ?HGB 13.4  ?HCT 41.7  ?MCV 94.3  ?PLT 103*  ? ?Basic Metabolic Panel: ?Recent Labs  ?Lab 05/26/21 ?1146  ?NA 135  ?K 4.3  ?CL 99  ?CO2 26  ?GLUCOSE 97  ?BUN 16  ?CREATININE 0.89  ?CALCIUM 9.1  ? ?GFR: ?Estimated Creatinine Clearance: 48.4 mL/min (by C-G formula based on SCr of 0.89 mg/dL). ?Liver Function Tests: ?Recent Labs  ?Lab 05/26/21 ?1146  ?AST 25  ?ALT 20  ?ALKPHOS 74  ?BILITOT  0.9  ?PROT 8.2*  ?ALBUMIN 3.7  ? ?No results for input(s): LIPASE, AMYLASE in the last 168 hours. ?No results for input(s): AMMONIA in the last 168 hours. ?Coagulation Profile: ?Recent Labs  ?Lab 05/26/21 ?1146  ?INR 1.1  ? ?Cardiac Enzymes: ?No results for input(s): CKTOTAL, CKMB, CKMBINDEX, TROPONINI in the last 168 hours. ?BNP (last 3 results) ?No results for input(s): PROBNP in the last 8760 hours. ?HbA1C: ?No results for input(s): HGBA1C in the last 72 hours. ?CBG: ?No results for input(s): GLUCAP in the last 168 hours. ?Lipid Profile: ?No results for input(s): CHOL, HDL, LDLCALC, TRIG, CHOLHDL, LDLDIRECT in the last 72 hours. ?Thyroid Function Tests: ?No results for input(s): TSH, T4TOTAL, FREET4, T3FREE, THYROIDAB in  the last 72 hours. ?Anemia Panel: ?No results for input(s): VITAMINB12, FOLATE, FERRITIN, TIBC, IRON, RETICCTPCT in the last 72 hours. ?Urine analysis: ?   ?Component Value Date/Time  ? COLORURINE YELLOW (A) 05/26/2021 1146  ? APPEARANCEUR CLEAR (A) 05/26/2021 1146  ? LABSPEC 1.009 05/26/2021 1146  ? PHURINE 5.0 05/26/2021 1146  ? GLUCOSEU NEGATIVE 05/26/2021 1146  ? HGBUR NEGATIVE 05/26/2021 1146  ? [place] NEGATIVE 05/26/2021 1146  ? [place] NEGATIVE 05/26/2021 1146  ? PROTEINUR NEGATIVE 05/26/2021 1146  ? NITRITE NEGATIVE 05/26/2021 1146  ? LEUKOCYTESUR SMALL (A) 05/26/2021 1146  ? ?Sepsis Labs: ?'@LABRCNTIP'$ (procalcitonin:4,lacticidven:4) ?) ?Recent Results (from the past 240 hour(s))  ?Resp Panel by RT-PCR (Flu A&B, Covid)     Status: None  ? Collection Time: 05/26/21 11:46 AM  ? Specimen: Nasopharyngeal(NP) swabs in vial transport medium  ?Result Value Ref Range Status  ? SARS Coronavirus 2 by RT PCR NEGATIVE NEGATIVE Final  ?  Comment: (NOTE) ?SARS-CoV-2 target nucleic acids are NOT DETECTED. ? ?The SARS-CoV-2 RNA is generally detectable in upper respiratory ?specimens during the acute phase of infection. The lowest ?concentration of SARS-CoV-2 viral copies this assay can detect  is ?138 copies/mL. A negative result does not preclude SARS-Cov-2 ?infection and should not be used as the sole basis for treatment or ?other patient management decisions. A negative result may occur with  ?imp

## 2021-05-26 NOTE — ED Triage Notes (Signed)
Pt c/o cough with congestion for the past 4 days, was seen at PCP on Saturday and started on doxycycline and then yesterday was started on a z-pack. States she is no feeling and better and tested negative for flu and covid. Pt is in NAD ?

## 2021-05-27 DIAGNOSIS — F5101 Primary insomnia: Secondary | ICD-10-CM

## 2021-05-27 DIAGNOSIS — E782 Mixed hyperlipidemia: Secondary | ICD-10-CM

## 2021-05-27 DIAGNOSIS — J122 Parainfluenza virus pneumonia: Secondary | ICD-10-CM

## 2021-05-27 LAB — CBC
HCT: 38.1 % (ref 36.0–46.0)
Hemoglobin: 12.3 g/dL (ref 12.0–15.0)
MCH: 29.9 pg (ref 26.0–34.0)
MCHC: 32.3 g/dL (ref 30.0–36.0)
MCV: 92.5 fL (ref 80.0–100.0)
Platelets: 96 10*3/uL — ABNORMAL LOW (ref 150–400)
RBC: 4.12 MIL/uL (ref 3.87–5.11)
RDW: 13.8 % (ref 11.5–15.5)
WBC: 4.6 10*3/uL (ref 4.0–10.5)
nRBC: 0 % (ref 0.0–0.2)

## 2021-05-27 LAB — CREATININE, SERUM
Creatinine, Ser: 0.81 mg/dL (ref 0.44–1.00)
GFR, Estimated: >60 mL/min (ref >60)

## 2021-05-27 MED ORDER — METHYLPREDNISOLONE SODIUM SUCC 125 MG IJ SOLR
60.0000 mg | Freq: Every day | INTRAMUSCULAR | Status: DC
  Administered 2021-05-27 – 2021-05-28 (×2): 60 mg via INTRAVENOUS
  Filled 2021-05-27 (×2): qty 2

## 2021-05-27 MED ORDER — LEVOFLOXACIN 750 MG PO TABS: 750.0000 mg | ORAL_TABLET | ORAL | Status: DC

## 2021-05-27 NOTE — Assessment & Plan Note (Signed)
Continue Zocor 

## 2021-05-27 NOTE — Progress Notes (Signed)
?  Progress Note ? ? ?Patient: Sherry Mosley SKA:[id number] DOB: May 12, 1945 DOA: 05/26/2021     1 ?DOS: the patient was seen and examined on 05/27/2021 ?  ? ? ?Assessment and Plan: ?* Parainfluenza virus bronchopneumonia ?Chest x-ray showing right middle lobe and right upper lobe infiltrate suggestive of pneumonia.  Patient has a lot of bronchospasm and coughing with deep breath.  Solu-Medrol 60 mg IV daily added.  Nebulizer treatments.  Reevaluate tomorrow.  Continue Levaquin for now but likely viral nature. ? ?History of non-Hodgkin's lymphoma ?Patient holding her ibrutinib ? ?Thrombocytopenia ([place]) ?Platelet count 96 today ? ?Primary insomnia ?Continue Restoril. ? ?Mixed hyperlipidemia ?Continue Zocor ? ?Essential hypertension ?Continue lisinopril ? ? ? ? ?  ? ?Subjective: Patient with cough and wheeze.  Patient states that she stopped her ibrutinib because she felt sick.  She has a history of lymphoma.  Coming in with shortness of breath.  Respiratory panel for parainfluenza virus. ? ?Physical Exam: ?Vitals:  ? 05/27/21 0530 05/27/21 0718 05/27/21 0809 05/27/21 1128  ?BP: 118/72 129/80  116/74  ?Pulse: 89 91  (!) 101  ?Resp: '16 16  16  '$ ?Temp: 97.6 ?F (36.4 ?C) 98.3 ?F (36.8 ?C)  98.3 ?F (36.8 ?C)  ?TempSrc: Oral Oral  Oral  ?SpO2: 93% 93% 96% 93%  ?Height:      ? ?Physical Exam ?HENT:  ?   Head: Normocephalic.  ?   Mouth/Throat:  ?   Pharynx: No oropharyngeal exudate.  ?Eyes:  ?   General: Lids are normal.  ?   Conjunctiva/sclera: Conjunctivae normal.  ?Cardiovascular:  ?   Rate and Rhythm: Normal rate and regular rhythm.  ?   Heart sounds: Normal heart sounds, S1 normal and S2 normal.  ?Pulmonary:  ?   Breath sounds: Examination of the right-middle field reveals decreased breath sounds and wheezing. Examination of the left-middle field reveals decreased breath sounds and wheezing. Examination of the right-lower field reveals decreased breath sounds and wheezing. Examination of the left-lower field reveals decreased  breath sounds and wheezing. Decreased breath sounds and wheezing present.  ?Abdominal:  ?   Palpations: Abdomen is soft.  ?   Tenderness: There is no abdominal tenderness.  ?Musculoskeletal:  ?   Right lower leg: No swelling.  ?   Left lower leg: No swelling.  ?Skin: ?   General: Skin is warm.  ?   Findings: No rash.  ?Neurological:  ?   Mental Status: She is alert and oriented to person, place, and time.  ?  ?Data Reviewed: ?Laboratory radiological data reviewed from this hospital stay.  Chest x-ray reviewed by me showing right-sided pneumonia middle lobe and upper lobe.  Platelet count 96 today.  Chemistry unremarkable. ? ?Family Communication: Spoke with husband at the bedside ? ?Disposition: ?Status is: Inpatient ?Remains inpatient appropriate because: Patient still has a lot of bronchospasm.  Added steroids and nebulizer treatments. ? ?Planned Discharge Destination: Home ? ? ?Author: ?Loletha Grayer, MD ?05/27/2021 12:23 PM ? ?For on call review [email or web address].  ?

## 2021-05-27 NOTE — Assessment & Plan Note (Signed)
Continue lisinopril

## 2021-05-27 NOTE — Assessment & Plan Note (Signed)
Continue Restoril. ?

## 2021-05-27 NOTE — Assessment & Plan Note (Addendum)
Platelet count 96 yesterday ?

## 2021-05-27 NOTE — Progress Notes (Signed)
Pt has medications in pill box in room. 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   ?

## 2021-05-27 NOTE — Assessment & Plan Note (Addendum)
Chest x-ray showing right middle lobe and right upper lobe infiltrate suggestive of pneumonia.  Patient has has improved after starting Solu-Medrol yesterday.  Patient given Solu-Medrol again today and will be prescribed prednisone for 3 more days upon discharge. ?

## 2021-05-27 NOTE — Assessment & Plan Note (Addendum)
Patient holding her ibrutinib while sick but can go on after finishes antibiotics ?

## 2021-05-28 LAB — URINE CULTURE: Culture: 50000 — AB

## 2021-05-28 LAB — LEGIONELLA PNEUMOPHILA SEROGP 1 UR AG: L. pneumophila Serogp 1 Ur Ag: NEGATIVE

## 2021-05-28 MED ORDER — ALBUTEROL SULFATE HFA 108 (90 BASE) MCG/ACT IN AERS: 2.0000 | INHALATION_SPRAY | Freq: Four times a day (QID) | RESPIRATORY_TRACT | 0 refills | Status: DC | PRN

## 2021-05-28 MED ORDER — PREDNISONE 20 MG PO TABS: ORAL_TABLET | ORAL | 0 refills | Status: DC

## 2021-05-28 MED ORDER — HYDROCOD POLI-CHLORPHE POLI ER 10-8 MG/5ML PO SUER: 5.0000 mL | Freq: Two times a day (BID) | ORAL | 0 refills | Status: DC | PRN

## 2021-05-28 MED ORDER — DOXYCYCLINE HYCLATE 100 MG PO CAPS: 100.0000 mg | ORAL_CAPSULE | Freq: Two times a day (BID) | ORAL | Status: DC

## 2021-05-28 NOTE — Discharge Summary (Signed)
?Physician Discharge Summary ?  ?Patient: Sherry Mosley MRN: [id number] DOB: Jun 24, 1945  ?Admit date:     05/26/2021  ?Discharge date: 05/28/21  ?Discharge Physician: Loletha Grayer  ? ?PCP: [name], NP  ? ?Recommendations at discharge:  ? ?Follow-up with PCP ? ?Discharge Diagnoses: ?Principal Problem: ?  Parainfluenza virus bronchopneumonia ?Active Problems: ?  History of non-Hodgkin's lymphoma ?  Thrombocytopenia ([place]) ?  Essential hypertension ?  Mixed hyperlipidemia ?  Primary insomnia ? ? ?Hospital Course: ?The patient was admitted to the hospital on 05/26/2021 and discharged on 05/28/2021.  Patient came in with cough and shortness of breath and found to have a right-sided pneumonia.  The patient was on antibiotics as outpatient.  The patient was started on Levaquin in the hospital.  COVID test was negative.  Influenza was negative.  Respiratory panel was positive for parainfluenza virus.  When I saw the patient on 05/27/2021, she had a lot of bronchospasm.  I started Solu-Medrol 60 mg daily.  On reevaluation on 05/28/2021, she had coarse breath sounds but was improved.  Upon discharge she can finish up her course of doxycycline.  Prednisone prescribed for 3 more days upon disposition. ? ?Assessment and Plan: ?* Parainfluenza virus bronchopneumonia ?Chest x-ray showing right middle lobe and right upper lobe infiltrate suggestive of pneumonia.  Patient has has improved after starting Solu-Medrol yesterday.  Patient given Solu-Medrol again today and will be prescribed prednisone for 3 more days upon discharge. ? ?History of non-Hodgkin's lymphoma ?Patient holding her ibrutinib while sick but can go on after finishes antibiotics ? ?Thrombocytopenia ([place]) ?Platelet count 96 yesterday ? ?Primary insomnia ?Continue Restoril. ? ?Mixed hyperlipidemia ?Continue Zocor ? ?Essential hypertension ?Continue lisinopril ? ? ? ? ?  ? ? ?Consultants: None ?Procedures performed: None ?Disposition: Home ?Diet recommendation:   ?Cardiac diet ?DISCHARGE MEDICATION: ?Allergies as of 05/28/2021   ? ?   Reactions  ? Cephalosporins   ? Rash  ? Penicillins   ? Significant Rash  ? ?  ? ?  ?Medication List  ?  ? ?STOP taking these medications   ? ?azithromycin 500 MG tablet ?Commonly known as: ZITHROMAX ?  ? ?  ? ?TAKE these medications   ? ?albuterol 108 (90 Base) MCG/ACT inhaler ?Commonly known as: VENTOLIN HFA ?Inhale 2 puffs into the lungs every 6 (six) hours as needed for wheezing or shortness of breath. ?  ?chlorpheniramine-HYDROcodone 10-8 MG/5ML ?Take 5 mLs by mouth every 12 (twelve) hours as needed for cough. ?  ?doxycycline 100 MG capsule ?Commonly known as: VIBRAMYCIN ?Take 1 capsule (100 mg total) by mouth 2 (two) times daily. Finish up your course ?  ?ibrutinib 140 MG capsule ?Commonly known as: Imbruvica ?Take 2 capsules (280 mg total) by mouth daily. Take with a glass of water. ?  ?lisinopril 5 MG tablet ?Commonly known as: ZESTRIL ?Take 1 tablet (5 mg total) by mouth daily. ?  ?Loratadine 10 MG Caps ?Take 1 tablet by mouth daily. ?  ?predniSONE 20 MG tablet ?Commonly known as: DELTASONE ?2 tabs po daily for three days ?Start taking on: May 29, 2021 ?  ?simvastatin 20 MG tablet ?Commonly known as: ZOCOR ?TAKE ONE TABLET BY MOUTH DAILY ?  ?temazepam 15 MG capsule ?Commonly known as: RESTORIL ?TAKE 1 CAPSULE BY MOUTH AT BEDTIME AS NEEDED FOR SLEEP. ?  ?vitamin C 1000 MG tablet ?Take 1 tablet by mouth daily. ?  ?Vitamin D3 50 MCG (2000 UT) capsule ?Take 1 capsule by mouth daily. ?  ?vitamin  E 180 MG (400 UNITS) capsule ?Take 400 Units by mouth daily. ?  ? ?  ? ? Follow-up Information   ? ? Gladstone Lighter, MD. Daphane Shepherd on 06/02/2021.   ?Specialty: Internal Medicine ?Why: @ 1:30pm  with Lang Snow ?Contact information: ?[address] ?[place] [address] ?[phone] ? ? ?  ?  ? ?  ?  ? ?  ? ?Discharge Exam: ?Blood pressure 119/63, temperature 97.7, pulse 84, respirations 16, pulse ox 90% on room air ? ?Physical Exam ?HENT:  ?    Head: Normocephalic.  ?   Mouth/Throat:  ?   Pharynx: No oropharyngeal exudate.  ?Eyes:  ?   General: Lids are normal.  ?   Conjunctiva/sclera: Conjunctivae normal.  ?Cardiovascular:  ?   Rate and Rhythm: Normal rate and regular rhythm.  ?   Heart sounds: Normal heart sounds, S1 normal and S2 normal.  ?Pulmonary:  ?   Breath sounds: Examination of the right-lower field reveals decreased breath sounds. Examination of the left-lower field reveals decreased breath sounds. Decreased breath sounds present. No wheezing, rhonchi or rales.  ?   Comments: Coarse breath sounds bilateral bases ?Abdominal:  ?   Palpations: Abdomen is soft.  ?   Tenderness: There is no abdominal tenderness.  ?Musculoskeletal:  ?   Right lower leg: No swelling.  ?   Left lower leg: No swelling.  ?Skin: ?   General: Skin is warm.  ?   Findings: No rash.  ?Neurological:  ?   Mental Status: She is alert and oriented to person, place, and time.  ?  ? ?Condition at discharge: stable ? ?The results of significant diagnostics from this hospitalization (including imaging, microbiology, ancillary and laboratory) are listed below for reference.  ? ?Imaging Studies: ?DG Chest 2 View ? ?Result Date: 05/26/2021 ?CLINICAL DATA:  Cough and congestion 4 days.  On antibiotics EXAM: CHEST - 2 VIEW COMPARISON:  12/01/2020 FINDINGS: Mild patchy airspace disease in the right middle lobe and lower lobe is new since the prior study. . No associated effusion Heart size and vascularity normal. Atherosclerotic calcification aortic arch. New pulse shadow noted bilaterally in the bases. No pleural effusion. IMPRESSION: Right middle lobe and right upper lower infiltrates suggestive of pneumonia. Electronically Signed   By: Franchot Gallo M.D.   On: 05/26/2021 10:52   ? ?Microbiology: ?Results for orders placed or performed during the hospital encounter of 05/26/21  ?Urine Culture     Status: Abnormal  ? Collection Time: 05/26/21 11:46 AM  ? Specimen: In/Out Cath Urine   ?Result Value Ref Range Status  ? Specimen Description   Final  ?  IN/OUT CATH URINE ?Performed at [facility], [address], [place], [place] [address] ?  ? Special Requests   Final  ?  NONE ?Performed at [facility], [address] [place], [place] [address] ?  ? Culture 50,000 COLONIES/mL PROTEUS MIRABILIS (A)  Final  ? Report Status 05/28/2021 FINAL  Final  ? Organism ID, Bacteria PROTEUS MIRABILIS (A)  Final  ?    Susceptibility  ? Proteus mirabilis - MIC*  ?  AMPICILLIN <=2 SENSITIVE Sensitive   ?  CEFAZOLIN <=4 SENSITIVE Sensitive   ?  CEFEPIME <=0.12 SENSITIVE Sensitive   ?  CEFTRIAXONE <=0.25 SENSITIVE Sensitive   ?  CIPROFLOXACIN <=0.25 SENSITIVE Sensitive   ?  GENTAMICIN <=1 SENSITIVE Sensitive   ?  IMIPENEM 2 SENSITIVE Sensitive   ?  NITROFURANTOIN 128 RESISTANT Resistant   ?  TRIMETH/SULFA <=20 SENSITIVE Sensitive   ?  AMPICILLIN/SULBACTAM <=2 SENSITIVE Sensitive   ?  PIP/TAZO <=4 SENSITIVE Sensitive   ?  * 50,000 COLONIES/mL PROTEUS MIRABILIS  ?Resp Panel by RT-PCR (Flu A&B, Covid)     Status: None  ? Collection Time: 05/26/21 11:46 AM  ? Specimen: Nasopharyngeal(NP) swabs in vial transport medium  ?Result Value Ref Range Status  ? SARS Coronavirus 2 by RT PCR NEGATIVE NEGATIVE Final  ?  Comment: (NOTE) ?SARS-CoV-2 target nucleic acids are NOT DETECTED. ? ?The SARS-CoV-2 RNA is generally detectable in upper respiratory ?specimens during the acute phase of infection. The lowest ?concentration of SARS-CoV-2 viral copies this assay can detect is ?138 copies/mL. A negative result does not preclude SARS-Cov-2 ?infection and should not be used as the sole basis for treatment or ?other patient management decisions. A negative result may occur with  ?improper specimen collection/handling, submission of specimen other ?than nasopharyngeal swab, presence of viral mutation(s) within the ?areas targeted by this assay, and inadequate number of viral ?copies(<138 copies/mL). A negative  result must be combined with ?clinical observations, patient history, and epidemiological ?information. The expected result is Negative. ? ?Fact Sheet for Patients:  ?[employer]ulse.com.au

## 2021-05-29 ENCOUNTER — Telehealth: Payer: Self-pay | Admitting: *Deleted

## 2021-05-29 NOTE — Telephone Encounter (Signed)
Transition Care Management Unsuccessful Follow-up Telephone Call ? ?Date of discharge and from where:  05/28/2021 ? ?Attempts:  1st Attempt ? ?Reason for unsuccessful TCM follow-up call:  Left voice message for patient to return call.  ? ?  ?

## 2021-05-31 LAB — CULTURE, BLOOD (ROUTINE X 2)
Culture: NO GROWTH
Culture: NO GROWTH

## 2021-06-03 NOTE — Telephone Encounter (Signed)
Transition Care Management Unsuccessful Follow-up Telephone Call ? ?Date of discharge and from where:  05/28/2021 ? ?Attempts:  2nd Attempt ? ?Reason for unsuccessful TCM follow-up call:  Left voice message ? ?  ?

## 2021-06-10 ENCOUNTER — Other Ambulatory Visit: Payer: Self-pay | Admitting: Nurse Practitioner

## 2021-06-25 ENCOUNTER — Other Ambulatory Visit: Payer: Self-pay

## 2021-06-25 DIAGNOSIS — C8580 Other specified types of non-Hodgkin lymphoma, unspecified site: Secondary | ICD-10-CM

## 2021-06-26 ENCOUNTER — Inpatient Hospital Stay: Payer: Medicare Other

## 2021-06-26 ENCOUNTER — Inpatient Hospital Stay: Payer: Medicare Other | Attending: Hematology | Admitting: Hematology

## 2021-06-26 ENCOUNTER — Other Ambulatory Visit: Payer: Self-pay

## 2021-06-26 DIAGNOSIS — Z79899 Other long term (current) drug therapy: Secondary | ICD-10-CM | POA: Insufficient documentation

## 2021-06-26 DIAGNOSIS — Z8572 Personal history of non-Hodgkin lymphomas: Secondary | ICD-10-CM | POA: Insufficient documentation

## 2021-06-26 DIAGNOSIS — C8580 Other specified types of non-Hodgkin lymphoma, unspecified site: Secondary | ICD-10-CM

## 2021-06-26 DIAGNOSIS — I10 Essential (primary) hypertension: Secondary | ICD-10-CM | POA: Diagnosis not present

## 2021-06-26 DIAGNOSIS — E7800 Pure hypercholesterolemia, unspecified: Secondary | ICD-10-CM | POA: Insufficient documentation

## 2021-06-26 LAB — CMP (CANCER CENTER ONLY)
ALT: 20 U/L (ref 0–44)
AST: 24 U/L (ref 15–41)
Albumin: 3.5 g/dL (ref 3.5–5.0)
Alkaline Phosphatase: 50 U/L (ref 38–126)
Anion gap: 4 — ABNORMAL LOW (ref 5–15)
BUN: 23 mg/dL (ref 8–23)
CO2: 30 mmol/L (ref 22–32)
Calcium: 9 mg/dL (ref 8.9–10.3)
Chloride: 104 mmol/L (ref 98–111)
Creatinine: 0.9 mg/dL (ref 0.44–1.00)
GFR, Estimated: >60 mL/min (ref >60)
Glucose, Bld: 87 mg/dL (ref 70–99)
Potassium: 4.2 mmol/L (ref 3.5–5.1)
Sodium: 138 mmol/L (ref 135–145)
Total Bilirubin: 0.5 mg/dL (ref 0.3–1.2)
Total Protein: 7.3 g/dL (ref 6.5–8.1)

## 2021-06-26 LAB — CBC WITH DIFFERENTIAL (CANCER CENTER ONLY)
Abs Immature Granulocytes: 0.02 10*3/uL (ref 0.00–0.07)
Basophils Absolute: 0 10*3/uL (ref 0.0–0.1)
Basophils Relative: 0 %
Eosinophils Absolute: 0.1 10*3/uL (ref 0.0–0.5)
Eosinophils Relative: 1 %
HCT: 42.2 % (ref 36.0–46.0)
Hemoglobin: 13.6 g/dL (ref 12.0–15.0)
Immature Granulocytes: 0 %
Lymphocytes Relative: 52 %
Lymphs Abs: 3.5 10*3/uL (ref 0.7–4.0)
MCH: 30.6 pg (ref 26.0–34.0)
MCHC: 32.2 g/dL (ref 30.0–36.0)
MCV: 94.8 fL (ref 80.0–100.0)
Monocytes Absolute: 0.7 10*3/uL (ref 0.1–1.0)
Monocytes Relative: 10 %
Neutro Abs: 2.5 10*3/uL (ref 1.7–7.7)
Neutrophils Relative %: 37 %
Platelet Count: 99 10*3/uL — ABNORMAL LOW (ref 150–400)
RBC: 4.45 MIL/uL (ref 3.87–5.11)
RDW: 14 % (ref 11.5–15.5)
Smear Review: NORMAL
WBC Count: 6.8 10*3/uL (ref 4.0–10.5)
nRBC: 0 % (ref 0.0–0.2)

## 2021-06-26 LAB — LACTATE DEHYDROGENASE: LDH: 138 U/L (ref 98–192)

## 2021-06-26 MED ORDER — IBRUTINIB 140 MG PO CAPS: 420.0000 mg | ORAL_CAPSULE | Freq: Every day | ORAL | 2 refills | Status: DC

## 2021-06-28 NOTE — Progress Notes (Signed)
? ? ?HEMATOLOGY/ONCOLOGY CLINIC NOTE ? ?Date of Service: .06/26/2021 ? ? ?Patient Care Team: ?[name], NP as PCP - General (Geriatric Medicine) ? ?CHIEF COMPLAINTS/PURPOSE OF CONSULTATION:  ?Follow-up for continued evaluation and management of relapsed marginal zone lymphoma ? ?ONCOLOGIC Hx:  ?B-cell lymphoproliferative disorder, NOS, diagnosed originally by fine needle aspiration of right groin lymph node on 10/25/12  ?S/p excisional biopsy of right inguinal lymph node on 12/08/12 with finding of B cell lymphoproliferative disorder with indistinct phenotype.  ?PET and CT scans performed on 12/21/12 consistent with clinical stage disease with adenopathy in both inguinal and femoral areas and left posterior triangle in the neck and probably the left axilla.  ?Completion of five cycles of CVP-R from 05/05/18 through 08/18/18.  ? ?12/08/2012 Surgical pathology of right groin lymph node found: Abnormal Lymphoma FISH result, loss of IGH/14q DNA sequence  ? ?HISTORY OF PRESENTING ILLNESS:  ? ?See previous notes for details of initial presentation ? ?INTERVAL HISTORY:  ? ?Sherry Mosley is here for continued evaluation and management of her relapsed marginal zone lymphoma. ?She continues to be on ibrutinib 280 milligrams p.o. daily without any significant toxicities. ?No abnormal bruising or bleeding.  Blood pressure stable. ?Notes that she can feel her right inguinal lymph node showing quite significantly. ?No other new lumps or bumps noted. ?No chest pain or shortness of breath. ?No abdominal pain or distention. ?Labs done today were reviewed with the patient and her husband in detail. ? ?MEDICAL HISTORY:  ? ?Pancreatitis (10/20/12), unknown etiology  ?Postmenopausal ?Uterine fibroids ?Benign breast biopsy (1964) ?Right ankle arthrodesis (2012) ?Bilateral cataract extractions (2001, 2005) ?Hypercholesterolemia ?Essential hypertension ? ?SURGICAL HISTORY: ?Inguinal LN biopsy ?TAH/BSO (1997) ? ?SOCIAL  HISTORY: ?Social History  ? ?Socioeconomic History  ? Marital status: Married  ?  Spouse name: Not on file  ? Number of children: 2  ? Years of education: Not on file  ? Highest education level: Not on file  ?Occupational History  ? Occupation: Physician  ?  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 Smoking status: Never  ? Smokeless tobacco: Never  ?Vaping Use  ? Vaping Use: Never used  ?Substance and Sexual Activity  ? Alcohol use: Not Currently  ?  Comment: Rare  ? Drug use: Never  ? Sexual activity: Not Currently  ?Other Topics Concern  ? Not on file  ?Social History Narrative  ? 1 daughter---pastor  ? Son --ER physician  ? 3 stepsons  ? Current marriage since 2002  ?   ? Has living will  ? Husband is health care POA. Son is alternate  ? Would accept resuscitation  ? No tube feeds if cognitively unaware  ? ?Social Determinants of Health  ? ?Financial Resource Strain: Not on file  ?Food Insecurity: Not on file  ?Transportation Needs: Not on file  ?Physical Activity: Not on file  ?Stress: Not on file  ?Social Connections: Not on file  ?Intimate Partner Violence: Not on file  ? ? ?FAMILY HISTORY: ?Family History  ?Problem Relation Age of Onset  ? Alzheimer's disease Mother   ? Heart failure Father   ? Prostate cancer Father   ? Diabetes Maternal Grandfather   ? Breast cancer Neg Hx   ? ? ?ALLERGIES:  is allergic to cephalosporins and penicillins.  ?PCN - significant rash  ?Cephalosporins - significant rash  ? ?MEDICATIONS:  ?Current Outpatient Medications  ?Medication Sig Dispense Refill  ? albuterol (VENTOLIN HFA) 108 (90 Base) MCG/ACT inhaler Inhale  2 puffs into the lungs every 6 (six) hours as needed for wheezing or shortness of breath. 8 g 0  ? Ascorbic Acid (VITAMIN C) 1000 MG tablet Take 1 tablet by mouth daily.    ? chlorpheniramine-HYDROcodone 10-8 MG/5ML Take 5 mLs by mouth every 12 (twelve) hours as needed for cough. 70 mL 0  ? Cholecalciferol (VITAMIN D3) 50 MCG (2000 UT) capsule Take 1  capsule by mouth daily.    ? doxycycline (VIBRAMYCIN) 100 MG capsule Take 1 capsule (100 mg total) by mouth 2 (two) times daily. Finish up your course    ? ibrutinib (IMBRUVICA) 140 MG capsule Take 3 capsules (420 mg total) by mouth daily. Take with a glass of water. 90 capsule 2  ? lisinopril (ZESTRIL) 5 MG tablet TAKE ONE TABLET BY MOUTH DAILY 90 tablet 1  ? Loratadine 10 MG CAPS Take 1 tablet by mouth daily.    ? predniSONE (DELTASONE) 20 MG tablet 2 tabs po daily for three days 6 tablet 0  ? simvastatin (ZOCOR) 20 MG tablet TAKE ONE TABLET BY MOUTH DAILY 90 tablet 3  ? temazepam (RESTORIL) 15 MG capsule TAKE 1 CAPSULE BY MOUTH AT BEDTIME AS NEEDED FOR SLEEP. 30 capsule 3  ? vitamin E 180 MG (400 UNITS) capsule Take 400 Units by mouth daily.    ? ?No current facility-administered medications for this visit.  ?Lisinopril 63m -  HTN  ?Simvastatin 240m- HLD ? ?REVIEW OF SYSTEMS:  . ?10 Point review of Systems was done is negative except as noted above. ? ?PHYSICAL EXAMINATION: ?.BP 135/75   Pulse 87   Temp 97.9 ?F (36.6 ?C)   Resp 18   Wt 141 lb (64 kg)   SpO2 98%   BMI 24.98 kg/m?  ?. ?GENERAL:alert, in no acute distress and comfortable ?SKIN: no acute rashes, no significant lesions ?EYES: conjunctiva are pink and non-injected, sclera anicteric ?OROPHARYNX: MMM, no exudates, no oropharyngeal erythema or ulceration ?NECK: supple, no JVD ?LYMPH:  no palpable lymphadenopathy in the cervical, axillary or inguinal regions ?LUNGS: clear to auscultation b/l with normal respiratory effort ?HEART: regular rate & rhythm ?ABDOMEN:  normoactive bowel sounds , non tender, not distended. ?Extremity: no pedal edema ?PSYCH: alert & oriented x 3 with fluent speech ?NEURO: no focal motor/sensory deficits ? ?LABORATORY DATA:  ?I have reviewed the data as listed ? ?. ? ?  Latest Ref Rng & Units 06/26/2021  ? 10:37 AM 05/27/2021  ?  6:21 AM 05/26/2021  ? 11:46 AM  ?CBC  ?WBC 4.0 - 10.5 K/uL 6.8   4.6   6.5    ?Hemoglobin 12.0 -  15.0 g/dL 13.6   12.3   13.4    ?Hematocrit 36.0 - 46.0 % 42.2   38.1   41.7    ?Platelets 150 - 400 K/uL 99   96   103    ? ?.CBC ?   ?Component Value Date/Time  ? WBC 6.8 06/26/2021 1037  ? WBC 4.6 05/27/2021 0621  ? RBC 4.45 06/26/2021 1037  ? HGB 13.6 06/26/2021 1037  ? HCT 42.2 06/26/2021 1037  ? PLT 99 (L) 06/26/2021 1037  ? MCV 94.8 06/26/2021 1037  ? MCH 30.6 06/26/2021 1037  ? MCHC 32.2 06/26/2021 1037  ? RDW 14.0 06/26/2021 1037  ? LYMPHSABS 3.5 06/26/2021 1037  ? MONOABS 0.7 06/26/2021 1037  ? EOSABS 0.1 06/26/2021 1037  ? BASOSABS 0.0 06/26/2021 1037  ? ? ? ? ?  Latest Ref Rng & Units  06/26/2021  ? 10:37 AM 05/27/2021  ?  6:21 AM 05/26/2021  ? 11:46 AM  ?CMP  ?Glucose 70 - 99 mg/dL 87    97    ?BUN 8 - 23 mg/dL 23    16    ?Creatinine 0.44 - 1.00 mg/dL 0.90   0.81   0.89    ?Sodium 135 - 145 mmol/L 138    135    ?Potassium 3.5 - 5.1 mmol/L 4.2    4.3    ?Chloride 98 - 111 mmol/L 104    99    ?CO2 22 - 32 mmol/L 30    26    ?Calcium 8.9 - 10.3 mg/dL 9.0    9.1    ?Total Protein 6.5 - 8.1 g/dL 7.3    8.2    ?Total Bilirubin 0.3 - 1.2 mg/dL 0.5    0.9    ?Alkaline Phos 38 - 126 U/L 50    74    ?AST 15 - 41 U/L 24    25    ?ALT 0 - 44 U/L 20    20    ? ?. ?Lab Results  ?Component Value Date  ? LDH 138 06/26/2021  ? ? ?SURGICAL PATHOLOGY  ?CASE: [id number]  ?PATIENT: Sherry Mosley  ?Surgical Pathology Report  ? ?Specimen Submitted:  ?A. Lymph node, right inguinal  ? ?Clinical History: History of low grade B cell lymphoma, now with right  ?inguinal lymphadenopathy, post US guided BX  ? ?DIAGNOSIS:  ?A. LYMPH NODE, RIGHT INGUINAL; ULTRASOUND-GUIDED BIOPSY:  ?- LOW-GRADE B-CELL LYMPHOMA WITH FEATURES MOST SUGGESTIVE OF MARGINAL  ?ZONE LYMPHOMA.  ? ?Comment:  ?Sections demonstrate small to medium sized lymphocytes with monocytoid  ?features.  Scattered plasma cells are present. Residual normal lymphoid  ?architecture is not identified.  ?Material was submitted for flow cytometric analysis which revealed a   ?monoclonal kappa B cell population that was negative for CD5, CD23, and  ?CD10 (see scanned report in [facility]).  ?A panel of immunohistochemical stains was performed with the following  ?pattern of immunoreactivity:  ?CD20: Pos

## 2021-06-29 ENCOUNTER — Encounter: Payer: Self-pay | Admitting: Hematology

## 2021-06-30 ENCOUNTER — Telehealth: Payer: Self-pay | Admitting: Hematology

## 2021-06-30 NOTE — Telephone Encounter (Signed)
Scheduled follow-up appointments per 4/28 los. Patient is aware. Mailed calendar. ?

## 2021-07-07 ENCOUNTER — Other Ambulatory Visit: Payer: Self-pay | Admitting: Nurse Practitioner

## 2021-07-07 DIAGNOSIS — G479 Sleep disorder, unspecified: Secondary | ICD-10-CM

## 2021-07-07 NOTE — Telephone Encounter (Signed)
Patient has request refill on medication Temazepam '15mg'$ . Patient medication last refilled 03/10/2021. Patient has No Non Opioid Contract on file. Patient has upcoming appointment 09/22/2021. Sign Contract added to patient appointment notes. Medication pend and sent to PCP Dewaine Oats Carlos American, NP for approval.  ?

## 2021-07-23 ENCOUNTER — Other Ambulatory Visit: Payer: Self-pay

## 2021-07-23 DIAGNOSIS — C8580 Other specified types of non-Hodgkin lymphoma, unspecified site: Secondary | ICD-10-CM

## 2021-07-24 ENCOUNTER — Inpatient Hospital Stay: Payer: Medicare Other | Attending: Hematology

## 2021-07-24 ENCOUNTER — Other Ambulatory Visit: Payer: Self-pay

## 2021-07-24 DIAGNOSIS — C8580 Other specified types of non-Hodgkin lymphoma, unspecified site: Secondary | ICD-10-CM

## 2021-07-24 DIAGNOSIS — Z8572 Personal history of non-Hodgkin lymphomas: Secondary | ICD-10-CM | POA: Insufficient documentation

## 2021-07-24 LAB — CBC WITH DIFFERENTIAL (CANCER CENTER ONLY)
Abs Immature Granulocytes: 0.01 10*3/uL (ref 0.00–0.07)
Basophils Absolute: 0 10*3/uL (ref 0.0–0.1)
Basophils Relative: 1 %
Eosinophils Absolute: 0 10*3/uL (ref 0.0–0.5)
Eosinophils Relative: 0 %
HCT: 41.1 % (ref 36.0–46.0)
Hemoglobin: 13.7 g/dL (ref 12.0–15.0)
Immature Granulocytes: 0 %
Lymphocytes Relative: 46 %
Lymphs Abs: 1.8 10*3/uL (ref 0.7–4.0)
MCH: 31.1 pg (ref 26.0–34.0)
MCHC: 33.3 g/dL (ref 30.0–36.0)
MCV: 93.4 fL (ref 80.0–100.0)
Monocytes Absolute: 0.7 10*3/uL (ref 0.1–1.0)
Monocytes Relative: 17 %
Neutro Abs: 1.5 10*3/uL — ABNORMAL LOW (ref 1.7–7.7)
Neutrophils Relative %: 36 %
Platelet Count: 87 10*3/uL — ABNORMAL LOW (ref 150–400)
RBC: 4.4 MIL/uL (ref 3.87–5.11)
RDW: 13.2 % (ref 11.5–15.5)
WBC Count: 4 10*3/uL (ref 4.0–10.5)
nRBC: 0 % (ref 0.0–0.2)

## 2021-07-24 LAB — CMP (CANCER CENTER ONLY)
ALT: 14 U/L (ref 0–44)
AST: 17 U/L (ref 15–41)
Albumin: 3.6 g/dL (ref 3.5–5.0)
Alkaline Phosphatase: 43 U/L (ref 38–126)
Anion gap: 5 (ref 5–15)
BUN: 24 mg/dL — ABNORMAL HIGH (ref 8–23)
CO2: 30 mmol/L (ref 22–32)
Calcium: 9.2 mg/dL (ref 8.9–10.3)
Chloride: 104 mmol/L (ref 98–111)
Creatinine: 0.92 mg/dL (ref 0.44–1.00)
GFR, Estimated: >60 mL/min (ref >60)
Glucose, Bld: 101 mg/dL — ABNORMAL HIGH (ref 70–99)
Potassium: 4 mmol/L (ref 3.5–5.1)
Sodium: 139 mmol/L (ref 135–145)
Total Bilirubin: 0.7 mg/dL (ref 0.3–1.2)
Total Protein: 7.1 g/dL (ref 6.5–8.1)

## 2021-07-24 LAB — LACTATE DEHYDROGENASE: LDH: 106 U/L (ref 98–192)

## 2021-07-28 DIAGNOSIS — H524 Presbyopia: Secondary | ICD-10-CM | POA: Diagnosis not present

## 2021-07-28 DIAGNOSIS — Z961 Presence of intraocular lens: Secondary | ICD-10-CM | POA: Diagnosis not present

## 2021-08-20 ENCOUNTER — Other Ambulatory Visit: Payer: Self-pay

## 2021-08-20 DIAGNOSIS — C8580 Other specified types of non-Hodgkin lymphoma, unspecified site: Secondary | ICD-10-CM

## 2021-08-21 ENCOUNTER — Inpatient Hospital Stay: Payer: Medicare Other | Attending: Hematology | Admitting: Hematology

## 2021-08-21 ENCOUNTER — Other Ambulatory Visit: Payer: Self-pay

## 2021-08-21 ENCOUNTER — Inpatient Hospital Stay: Payer: Medicare Other

## 2021-08-21 VITALS — BP 133/76 | HR 83 | Temp 97.6°F | Resp 18 | Wt 140.4 lb

## 2021-08-21 DIAGNOSIS — E7800 Pure hypercholesterolemia, unspecified: Secondary | ICD-10-CM | POA: Diagnosis not present

## 2021-08-21 DIAGNOSIS — C8580 Other specified types of non-Hodgkin lymphoma, unspecified site: Secondary | ICD-10-CM | POA: Diagnosis not present

## 2021-08-21 DIAGNOSIS — I10 Essential (primary) hypertension: Secondary | ICD-10-CM | POA: Insufficient documentation

## 2021-08-21 DIAGNOSIS — C884 Extranodal marginal zone B-cell lymphoma of mucosa-associated lymphoid tissue [MALT-lymphoma]: Secondary | ICD-10-CM | POA: Insufficient documentation

## 2021-08-21 DIAGNOSIS — D696 Thrombocytopenia, unspecified: Secondary | ICD-10-CM | POA: Diagnosis not present

## 2021-08-21 LAB — LACTATE DEHYDROGENASE: LDH: 117 U/L (ref 98–192)

## 2021-08-21 LAB — CBC WITH DIFFERENTIAL (CANCER CENTER ONLY)
Abs Immature Granulocytes: 0.01 10*3/uL (ref 0.00–0.07)
Basophils Absolute: 0 10*3/uL (ref 0.0–0.1)
Basophils Relative: 1 %
Eosinophils Absolute: 0 10*3/uL (ref 0.0–0.5)
Eosinophils Relative: 1 %
HCT: 43.2 % (ref 36.0–46.0)
Hemoglobin: 14.2 g/dL (ref 12.0–15.0)
Immature Granulocytes: 0 %
Lymphocytes Relative: 40 %
Lymphs Abs: 1.7 10*3/uL (ref 0.7–4.0)
MCH: 30.3 pg (ref 26.0–34.0)
MCHC: 32.9 g/dL (ref 30.0–36.0)
MCV: 92.3 fL (ref 80.0–100.0)
Monocytes Absolute: 0.3 10*3/uL (ref 0.1–1.0)
Monocytes Relative: 8 %
Neutro Abs: 2.1 10*3/uL (ref 1.7–7.7)
Neutrophils Relative %: 50 %
Platelet Count: 86 10*3/uL — ABNORMAL LOW (ref 150–400)
RBC: 4.68 MIL/uL (ref 3.87–5.11)
RDW: 12.8 % (ref 11.5–15.5)
WBC Count: 4.1 10*3/uL (ref 4.0–10.5)
nRBC: 0 % (ref 0.0–0.2)

## 2021-08-21 LAB — CMP (CANCER CENTER ONLY)
ALT: 11 U/L (ref 0–44)
AST: 14 U/L — ABNORMAL LOW (ref 15–41)
Albumin: 3.7 g/dL (ref 3.5–5.0)
Alkaline Phosphatase: 42 U/L (ref 38–126)
Anion gap: 4 — ABNORMAL LOW (ref 5–15)
BUN: 22 mg/dL (ref 8–23)
CO2: 31 mmol/L (ref 22–32)
Calcium: 9.3 mg/dL (ref 8.9–10.3)
Chloride: 105 mmol/L (ref 98–111)
Creatinine: 0.93 mg/dL (ref 0.44–1.00)
GFR, Estimated: >60 mL/min (ref >60)
Glucose, Bld: 87 mg/dL (ref 70–99)
Potassium: 3.9 mmol/L (ref 3.5–5.1)
Sodium: 140 mmol/L (ref 135–145)
Total Bilirubin: 0.8 mg/dL (ref 0.3–1.2)
Total Protein: 7.1 g/dL (ref 6.5–8.1)

## 2021-08-21 NOTE — Progress Notes (Signed)
HEMATOLOGY/ONCOLOGY CLINIC NOTE  Date of Service: 08/21/2021   Patient Care Team: Sharon Seller, NP as PCP - General (Geriatric Medicine)  CHIEF COMPLAINTS/PURPOSE OF CONSULTATION:  Follow-up for continued evaluation and management of relapsed marginal zone lymphoma  ONCOLOGIC Hx:  B-cell lymphoproliferative disorder, NOS, diagnosed originally by fine needle aspiration of right groin lymph node on 10/25/12  S/p excisional biopsy of right inguinal lymph node on 12/08/12 with finding of B cell lymphoproliferative disorder with indistinct phenotype.  PET and CT scans performed on 12/21/12 consistent with clinical stage disease with adenopathy in both inguinal and femoral areas and left posterior triangle in the neck and probably the left axilla.  Completion of five cycles of CVP-R from 05/05/18 through 08/18/18.   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 She reports She is doing well with no new symptoms or concerns.  She notes persistent insomnia.  She has been on Ibrutinib 420 milligrams p.o. daily without any new notable toxicities.  No abnormal bruising or bleeding.  Blood pressure stable. No other new lumps or bumps noted. No chest pain or shortness of breath. No abdominal pain or distention. No new or unexpected weight loss. No other new or acute focal symptoms.  Labs done today were reviewed with the patient and her husband in detail.  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 Son is alternate   Would accept resuscitation   No tube feeds if cognitively unaware   Social Determinants of Health   Financial Resource Strain: Not on file  Food Insecurity: Not on file  Transportation Needs: Not on file  Physical Activity: Not on file  Stress: Not on file  Social Connections: Not on file  Intimate Partner Violence: Not on file    FAMILY HISTORY: Family History  Problem Relation Age of Onset   Alzheimer's disease Mother    Heart failure Father    Prostate cancer Father    Diabetes Maternal Grandfather    Breast cancer Neg Hx     ALLERGIES:  is allergic to cephalosporins and penicillins.  PCN - significant rash  Cephalosporins - significant  rash   MEDICATIONS:  Current Outpatient Medications  Medication Sig Dispense Refill   albuterol (VENTOLIN HFA) 108 (90 Base) MCG/ACT inhaler Inhale 2 puffs into the lungs every 6 (six) hours as needed for wheezing or shortness of breath. 8 g 0   Ascorbic Acid (VITAMIN C) 1000 MG tablet Take 1 tablet by mouth daily.     chlorpheniramine-HYDROcodone 10-8 MG/5ML Take 5 mLs by mouth  every 12 (twelve) hours as needed for cough. 70 mL 0   Cholecalciferol (VITAMIN D3) 50 MCG (2000 UT) capsule Take 1 capsule by mouth daily.     doxycycline (VIBRAMYCIN) 100 MG capsule Take 1 capsule (100 mg total) by mouth 2 (two) times daily. Finish up your course     ibrutinib (IMBRUVICA) 140 MG capsule Take 3 capsules (420 mg total) by mouth daily. Take with a glass of water. 90 capsule 2   lisinopril (ZESTRIL) 5 MG tablet TAKE ONE TABLET BY MOUTH DAILY 90 tablet 1   Loratadine 10 MG CAPS Take 1 tablet by mouth daily.     predniSONE (DELTASONE) 20 MG tablet 2 tabs po daily for three days 6 tablet 0   simvastatin (ZOCOR) 20 MG tablet TAKE ONE TABLET BY MOUTH DAILY 90 tablet 3   temazepam (RESTORIL) 15 MG capsule TAKE ONE CAPSULE BY MOUTH EVERY NIGHT AT BEDTIME AS NEEDED FOR SLEEP 30 capsule 3   vitamin E 180 MG (400 UNITS) capsule Take 400 Units by mouth daily.     No current facility-administered medications for this visit.  Lisinopril $RemoveBef'10mg'WqIDyldLOA$  -  HTN  Simvastatin $RemoveBefo'20mg'QWApJUymtIj$  - HLD  REVIEW OF SYSTEMS:  . 10 Point review of Systems was done is negative except as noted above.  PHYSICAL EXAMINATION: .BP 133/76   Pulse 83   Temp 97.6 F (36.4 C)   Resp 18   Wt 140 lb 6.4 oz (63.7 kg)   SpO2 98%   BMI 24.87 kg/m  NAD GENERAL:alert, in no acute distress and comfortable SKIN: no acute rashes, no significant lesions EYES: conjunctiva are pink and non-injected, sclera anicteric NECK: supple, no JVD LYMPH:  left supraclavicular 1.5 cm palpable lymph node. right lymph node improved. LUNGS: clear to auscultation b/l with normal respiratory effort HEART: regular rate & rhythm ABDOMEN:  normoactive bowel sounds , non tender, not distended. Extremity: no pedal edema PSYCH: alert & oriented x 3 with fluent speech NEURO: no focal motor/sensory deficits  LABORATORY DATA:  I have reviewed the data as listed  .    Latest Ref Rng & Units 08/21/2021   10:30 AM 07/24/2021   10:34 AM 06/26/2021   10:37  AM  CBC  WBC 4.0 - 10.5 K/uL 4.1  4.0  6.8   Hemoglobin 12.0 - 15.0 g/dL 14.2  13.7  13.6   Hematocrit 36.0 - 46.0 % 43.2  41.1  42.2   Platelets 150 - 400 K/uL 86  87  99    .CBC    Component Value Date/Time   WBC 4.1 08/21/2021 1030   WBC 4.6 05/27/2021 0621   RBC 4.68 08/21/2021 1030   HGB 14.2 08/21/2021 1030   HCT 43.2 08/21/2021 1030   PLT 86 (L) 08/21/2021 1030   MCV 92.3 08/21/2021 1030   MCH 30.3 08/21/2021 1030   MCHC 32.9 08/21/2021 1030   RDW 12.8 08/21/2021 1030   LYMPHSABS 1.7 08/21/2021 1030   MONOABS 0.3 08/21/2021 1030   EOSABS 0.0 08/21/2021 1030   BASOSABS 0.0 08/21/2021 1030  Latest Ref Rng & Units 08/21/2021   10:30 AM 07/24/2021   10:34 AM 06/26/2021   10:37 AM  CMP  Glucose 70 - 99 mg/dL 87  101  87   BUN 8 - 23 mg/dL $Remove'22  24  23   'fiQGJQm$ Creatinine 0.44 - 1.00 mg/dL 0.93  0.92  0.90   Sodium 135 - 145 mmol/L 140  139  138   Potassium 3.5 - 5.1 mmol/L 3.9  4.0  4.2   Chloride 98 - 111 mmol/L 105  104  104   CO2 22 - 32 mmol/L $RemoveB'31  30  30   'nHvKhuwZ$ Calcium 8.9 - 10.3 mg/dL 9.3  9.2  9.0   Total Protein 6.5 - 8.1 g/dL 7.1  7.1  7.3   Total Bilirubin 0.3 - 1.2 mg/dL 0.8  0.7  0.5   Alkaline Phos 38 - 126 U/L 42  43  50   AST 15 - 41 U/L $Remo'14  17  24   'UGPrD$ ALT 0 - 44 U/L $Remo'11  14  20    'qljLP$ . Lab Results  Component Value Date   LDH 117 08/21/2021    SURGICAL PATHOLOGY  CASE: [id number]  PATIENT: Nalda Pipkins  Surgical Pathology Report   Specimen Submitted:  A. Lymph node, right inguinal   Clinical History: History of low grade B cell lymphoma, now with right  inguinal lymphadenopathy, post US guided BX   DIAGNOSIS:  A. LYMPH NODE, RIGHT INGUINAL; ULTRASOUND-GUIDED BIOPSY:  - LOW-GRADE B-CELL LYMPHOMA WITH FEATURES MOST SUGGESTIVE OF MARGINAL  ZONE LYMPHOMA.   Comment:  Sections demonstrate small to medium sized lymphocytes with monocytoid  features.  Scattered plasma cells are present. Residual normal lymphoid  architecture is not identified.   Material was submitted for flow cytometric analysis which revealed a  monoclonal kappa B cell population that was negative for CD5, CD23, and  CD10 (see scanned report in [facility]).  A panel of immunohistochemical stains was performed with the following  pattern of immunoreactivity:  CD20: Positive in neoplastic lymphocytes  CD3: Highlights background T cells in a distribution similar to CD5  CD5: Highlights background T cells in a distribution similar to CD3  CD23: Highlights focal residual dendritic meshworks  CD10: Negative in neoplastic lymphocytes  BCL-2: Positive in neoplastic lymphocytes  BCL 6: Negative in neoplastic lymphocytes  Cyclin D1: Negative in neoplastic lymphocytes  Sox 11: Negative in neoplastic lymphocytes  Kappa-ISH: Positive in neoplastic lymphocytes  Lambda-ISH: Negative in neoplastic lymphocytes  Ki-67: Approximately 30%  The histologic features in conjunction with the immunophenotypic  findings support the diagnosis of a low-grade mature B-cell lymphoma  most suggestive of marginal zone lymphoma.   Sox 11 IHC and kappa / lambda ISH slides were prepared by [place] [facility] [employer], [place], [place].  The remaining IHC slides were prepared by [facility], [place], [place]. All controls stained appropriately.   This test was developed and its performance characteristics determined  by [facility]. It has not been cleared or approved by the [place] Food and Drug  Administration. The FDA does not require this test to go through  premarket FDA review. This test is used for clinical purposes. It should  not be regarded as investigational or for research. This laboratory is  certified under the Clinical Laboratory Improvement Amendments (CLIA) as  qualified to perform high complexity clinical laboratory testing.   GROSS DESCRIPTION:  A. Labeled: Right inguinal lymph node  Received: The specimen is  received fresh on  saline soaked gauze.  Tissue fragment(s): Multiple  Size: Range from 0.5 to 1.9 cm in length and 0.1 cm in diameter  Description: Received are multiple cores and fragments of tan-white soft  tissue.  1 touch prep with Diff-Quik stain is performed.  Representative  sections are submitted for flow cytometry in RPMI.  The remainder of the  specimen is submitted in cassettes 1-4 with 1 core per cassette.    Final Diagnosis performed by Quay Burow, MD.   Electronically signed  08/31/2019 1:38:09PM  The electronic signature indicates that the named Attending Pathologist  has evaluated the specimen  Technical component performed at [facility], [address] [place],  [place] [address] Lab: [phone] Dir: Rush Farmer, MD, MMM   Professional component performed at [facility], [facility], [address], [place], [place] [address] Lab: [phone]  Dir: Dellia Nims. Reuel Derby, MD   RADIOGRAPHIC STUDIES: I have personally reviewed the radiological images as listed and agreed with the findings in the report. No results found.  ASSESSMENT & PLAN:   76 y.o. with   1) Atleast Stage IIIA Low grade B cell Non Hodgkins Lymphoma -consistent with Marginal zone lymphoma S/p R-CVP x  five cycles from 05/05/18 through 08/18/18.  Patient with progression of disease with bulky abdominal pelvic disease with significant right groin bulky adenopathy. Patient was started on weekly Rituxan and received 2 total weekly treatments on 11/21/2020 and 11/28/2020 and developed serum sickness like reaction which is why it was discontinued.  2) h/o Serum sickness-like reaction to Rituxan. (2nd weekly dose)  3) petechiae on lower extremities after starting ibrutinib -now resolved and has not recurred with reintroduction of ibrutinib  4) mild to moderate epistaxis x2 episodes.-- resolved Patient is now off Celexa which was likely causing platelet dysfunction in addition to her Ibrutinib.  PLAN: Labs done  today reviewed with the patient in detail. CBC stable with mild stable thrombocytopenia of 80-90k. CMP stable. LDH within normal limits. Patient does not report any significant toxicities from her current dose of ibrutinib at $RemoveBefo'420mg'KRDjoMjXngT$  p.o. daily.  No abnormal bruising or bleeding. -She will continue ibrutinib 420 mg p.o. daily at this time.  she notes that her index right inguinal lymph node has become much smaller in size suggesting good treatment response. -Patient will monitor her blood pressure at home as discussed and will let us know if there are any new toxicities from her increased dose of ibrutinib. -We will plan to repeat whole-body imaging after patient is on 420 mg of ibrutinib for 6 months unless there are new symptoms or lab changes prior to that.  FOLLOW UP: RTC with Dr Irene Limbo with labs in 2 months  The total time spent in the appointment was 20 minutes*.  All of the patient's questions were answered with apparent satisfaction. The patient knows to call the clinic with any problems, questions or concerns.   Sullivan Lone MD MS AAHIVMS [facility] Hematology/Oncology Physician [facility]  .*Total Encounter Time as defined by the Centers for Medicare and Medicaid Services includes, in addition to the face-to-face time of a patient visit (documented in the note above) non-face-to-face time: obtaining and reviewing outside history, ordering and reviewing medications, tests or procedures, care coordination (communications with other health care professionals or caregivers) and documentation in the medical record.  I, Melene Muller, am acting as scribe for Dr. Sullivan Lone, MD.  .I have reviewed the above documentation for accuracy and completeness, and I  agree with the above. Brunetta Genera MD

## 2021-08-27 ENCOUNTER — Encounter: Payer: Self-pay | Admitting: Hematology

## 2021-09-21 ENCOUNTER — Other Ambulatory Visit: Payer: Self-pay

## 2021-09-22 ENCOUNTER — Other Ambulatory Visit: Payer: Self-pay

## 2021-09-22 ENCOUNTER — Encounter: Payer: Self-pay | Admitting: Nurse Practitioner

## 2021-09-22 ENCOUNTER — Encounter: Payer: Medicare Other | Admitting: Nurse Practitioner

## 2021-09-22 ENCOUNTER — Ambulatory Visit: Payer: Medicare Other | Admitting: Nurse Practitioner

## 2021-09-22 VITALS — BP 110/68 | HR 87 | Temp 98.4°F | Ht 63.0 in | Wt 140.0 lb

## 2021-09-22 DIAGNOSIS — C8580 Other specified types of non-Hodgkin lymphoma, unspecified site: Secondary | ICD-10-CM

## 2021-09-22 DIAGNOSIS — I10 Essential (primary) hypertension: Secondary | ICD-10-CM

## 2021-09-22 DIAGNOSIS — D696 Thrombocytopenia, unspecified: Secondary | ICD-10-CM

## 2021-09-22 DIAGNOSIS — F5101 Primary insomnia: Secondary | ICD-10-CM | POA: Diagnosis not present

## 2021-09-22 DIAGNOSIS — E782 Mixed hyperlipidemia: Secondary | ICD-10-CM

## 2021-09-22 MED ORDER — IBRUTINIB 140 MG PO CAPS: 420.0000 mg | ORAL_CAPSULE | Freq: Every day | ORAL | 2 refills | Status: DC

## 2021-09-22 NOTE — Progress Notes (Signed)
Careteam: Patient Care Team: [name], NP as PCP - General (Geriatric Medicine)  Advanced Directive information Does Patient Have a Medical Advance Directive?: Yes, Type of Advance Directive: [place];Living will, Does patient want to make changes to medical advance directive?: No - Patient declined  Allergies  Allergen Reactions   Cephalosporins     Rash   Penicillins     Significant Rash    Chief Complaint  Patient presents with   Medical Management of Chronic Issues    6 month follow-up and sign treatment agreement for temazepam. Discuss need for shingrix or post pone if patient refuses. Patient denies receiving any vaccines since last visit. Patient is in agreement with getting a Bone Density, order was placed in January       HPI: Patient is a 76 y.o. female seen in today at the [facility] for routine follow up.   Insomnia- controlled on tazempam.   She continues to follow up with Dr Irene Limbo for lymphoma   No longer having diarrhea.   Htn- controlled on lisinopril 5 mg daily  No anxiety or depression.   She has her son in from out to town.   Review of Systems:  Review of Systems  Constitutional:  Negative for chills, fever and weight loss.  HENT:  Negative for tinnitus.   Respiratory:  Negative for cough, sputum production and shortness of breath.   Cardiovascular:  Negative for chest pain, palpitations and leg swelling.  Gastrointestinal:  Negative for abdominal pain, constipation, diarrhea and heartburn.  Genitourinary:  Negative for dysuria, frequency and urgency.  Musculoskeletal:  Negative for back pain, falls, joint pain and myalgias.  Skin: Negative.   Neurological:  Negative for dizziness and headaches.  Psychiatric/Behavioral:  Negative for depression and memory loss. The patient does not have insomnia.     Past Medical History:  Diagnosis Date   Generalized osteoarthritis    Hyperlipidemia    Hypertension     Mood disorder ([place])    Non Hodgkin's lymphoma ([place])    Sleep disorder    Past Surgical History:  Procedure Laterality Date   ABDOMINAL HYSTERECTOMY  1997   ANKLE ARTHRODESIS Right 2012   BREAST BIOPSY Left 1969   fibroadenoma   CATARACT EXTRACTION W/ INTRAOCULAR LENS IMPLANT Bilateral    LYMPH NODE BIOPSY  2014   excisional biopsy right groin   Thumb surgery     removal of part of extensor tendon   Social History:   reports that she has never smoked. She has never used smokeless tobacco. She reports current alcohol use. She reports that she does not use drugs.  Family History  Problem Relation Age of Onset   Alzheimer's disease Mother    Heart failure Father    Prostate cancer Father    Diabetes Maternal Grandfather    Breast cancer Neg Hx     Medications: Patient's Medications  New Prescriptions   No medications on file  Previous Medications   ASCORBIC ACID (VITAMIN C) 1000 MG TABLET    Take 1 tablet by mouth as directed. Takes off and on , sporadically   CHOLECALCIFEROL (VITAMIN D3) 50 MCG (2000 UT) CAPSULE    Take 1 capsule by mouth as directed. Takes off and of, sporadically   IBRUTINIB (IMBRUVICA) 140 MG CAPSULE    Take 3 capsules (420 mg total) by mouth daily. Take with a glass of water.   LISINOPRIL (ZESTRIL) 5 MG TABLET    TAKE  ONE TABLET BY MOUTH DAILY   LORATADINE 10 MG CAPS    Take 1 tablet by mouth daily.   SIMVASTATIN (ZOCOR) 20 MG TABLET    TAKE ONE TABLET BY MOUTH DAILY   TEMAZEPAM (RESTORIL) 15 MG CAPSULE    TAKE ONE CAPSULE BY MOUTH EVERY NIGHT AT BEDTIME AS NEEDED FOR SLEEP   VITAMIN E 180 MG (400 UNITS) CAPSULE    Take 400 Units by mouth as directed. Takes off and on, sporadically  Modified Medications   No medications on file  Discontinued Medications   ALBUTEROL (VENTOLIN HFA) 108 (90 BASE) MCG/ACT INHALER    Inhale 2 puffs into the lungs every 6 (six) hours as needed for wheezing or shortness of breath.   CHLORPHENIRAMINE-HYDROCODONE 10-8 MG/5ML     Take 5 mLs by mouth every 12 (twelve) hours as needed for cough.   DOXYCYCLINE (VIBRAMYCIN) 100 MG CAPSULE    Take 1 capsule (100 mg total) by mouth 2 (two) times daily. Finish up your course   PREDNISONE (DELTASONE) 20 MG TABLET    2 tabs po daily for three days    Physical Exam:  Vitals:   09/22/21 1015  BP: 110/68  Pulse: 87  Temp: 98.4 F (36.9 C)  TempSrc: Temporal  SpO2: 99%  Weight: 140 lb (63.5 kg)  Height: '5\' 3"'$  (1.6 m)   Body mass index is 24.8 kg/m. Wt Readings from Last 3 Encounters:  09/22/21 140 lb (63.5 kg)  08/21/21 140 lb 6.4 oz (63.7 kg)  06/26/21 141 lb (64 kg)    Physical Exam Constitutional:      General: She is not in acute distress.    Appearance: She is well-developed. She is not diaphoretic.  HENT:     Head: Normocephalic and atraumatic.     Mouth/Throat:     Pharynx: No oropharyngeal exudate.  Eyes:     Conjunctiva/sclera: Conjunctivae normal.     Pupils: Pupils are equal, round, and reactive to light.  Cardiovascular:     Rate and Rhythm: Normal rate and regular rhythm.     Heart sounds: Normal heart sounds.  Pulmonary:     Effort: Pulmonary effort is normal.     Breath sounds: Normal breath sounds.  Abdominal:     General: Bowel sounds are normal.     Palpations: Abdomen is soft.  Musculoskeletal:     Cervical back: Normal range of motion and neck supple.     Right lower leg: No edema.     Left lower leg: No edema.  Skin:    General: Skin is warm and dry.  Neurological:     Mental Status: She is alert and oriented to person, place, and time.  Psychiatric:        Mood and Affect: Mood normal.     Labs reviewed: Basic Metabolic Panel: Recent Labs    06/26/21 1037 07/24/21 1034 08/21/21 1030  NA 138 139 140  K 4.2 4.0 3.9  CL 104 104 105  CO2 '30 30 31  '$ GLUCOSE 87 101* 87  BUN 23 24* 22  CREATININE 0.90 0.92 0.93  CALCIUM 9.0 9.2 9.3   Liver Function Tests: Recent Labs    06/26/21 1037 07/24/21 1034 08/21/21 1030   AST 24 17 14*  ALT '20 14 11  '$ ALKPHOS 50 43 42  BILITOT 0.5 0.7 0.8  PROT 7.3 7.1 7.1  ALBUMIN 3.5 3.6 3.7   No results for input(s): "LIPASE", "AMYLASE" in the last 8760 hours. No results  for input(s): "AMMONIA" in the last 8760 hours. CBC: Recent Labs    06/26/21 1037 07/24/21 1034 08/21/21 1030  WBC 6.8 4.0 4.1  NEUTROABS 2.5 1.5* 2.1  HGB 13.6 13.7 14.2  HCT 42.2 41.1 43.2  MCV 94.8 93.4 92.3  PLT 99* 87* 86*   Lipid Panel: Recent Labs    10/17/20 0000  CHOL 173  HDL 26*  LDLCALC 110  TRIG 242*   TSH: No results for input(s): "TSH" in the last 8760 hours. A1C: No results found for: "HGBA1C"   Assessment/Plan 1. Essential hypertension -Blood pressure well controlled, goal bp <140/90 Continue current medications and dietary modifications follow metabolic panel  2. Thrombocytopenia (HCC) -stable, continues to be monitored by on[facility]oy  3. Marginal zone lymphoma (HCC) Continues on ibrutinib through oncology.   4. Mixed hyperlipidemia -continues on zocor, due for follow up lipids, would prefer to get this when she gets her labs through oncology  5. Primary insomnia Stable on temazepam.    Next appt: 03/16/2022 for AWV Khristine Verno K. [place], [facility] [phone]

## 2021-09-23 ENCOUNTER — Other Ambulatory Visit: Payer: Self-pay

## 2021-09-23 ENCOUNTER — Telehealth: Payer: Self-pay

## 2021-09-23 NOTE — Progress Notes (Signed)
Err

## 2021-09-23 NOTE — Telephone Encounter (Signed)
Patient was seen at the [facility] clinic on Tuesday 09/22/2021 and we forgot to get her to sign a treatment agreement for Temazepam.  I called patient, yesterday around 2 pm to see if she could stop back by the clinic to sign treatment agreement before 5 pm and if not I would mailed.  Patient did not come back by the clinic and treatment agreement was mailed today.   A handwritten note was attached informing patient to review, sign, and bring treatment agreement by the [facility] clinic for further processing.

## 2021-09-29 ENCOUNTER — Other Ambulatory Visit: Payer: Self-pay

## 2021-10-20 ENCOUNTER — Other Ambulatory Visit: Payer: Self-pay | Admitting: *Deleted

## 2021-10-20 DIAGNOSIS — C8580 Other specified types of non-Hodgkin lymphoma, unspecified site: Secondary | ICD-10-CM

## 2021-10-21 ENCOUNTER — Other Ambulatory Visit: Payer: Self-pay

## 2021-10-21 ENCOUNTER — Inpatient Hospital Stay: Payer: Medicare Other | Attending: Hematology | Admitting: Hematology

## 2021-10-21 ENCOUNTER — Inpatient Hospital Stay: Payer: Medicare Other

## 2021-10-21 VITALS — BP 144/82 | HR 87 | Temp 98.1°F | Resp 17 | Ht 63.0 in | Wt 141.9 lb

## 2021-10-21 DIAGNOSIS — R233 Spontaneous ecchymoses: Secondary | ICD-10-CM | POA: Insufficient documentation

## 2021-10-21 DIAGNOSIS — I10 Essential (primary) hypertension: Secondary | ICD-10-CM | POA: Insufficient documentation

## 2021-10-21 DIAGNOSIS — C8580 Other specified types of non-Hodgkin lymphoma, unspecified site: Secondary | ICD-10-CM

## 2021-10-21 DIAGNOSIS — Z8042 Family history of malignant neoplasm of prostate: Secondary | ICD-10-CM | POA: Diagnosis not present

## 2021-10-21 DIAGNOSIS — C884 Extranodal marginal zone B-cell lymphoma of mucosa-associated lymphoid tissue [MALT-lymphoma]: Secondary | ICD-10-CM | POA: Diagnosis not present

## 2021-10-21 DIAGNOSIS — E7800 Pure hypercholesterolemia, unspecified: Secondary | ICD-10-CM | POA: Insufficient documentation

## 2021-10-21 DIAGNOSIS — T8069XD Other serum reaction due to other serum, subsequent encounter: Secondary | ICD-10-CM | POA: Diagnosis not present

## 2021-10-21 LAB — CMP (CANCER CENTER ONLY)
ALT: 18 U/L (ref 0–44)
AST: 16 U/L (ref 15–41)
Albumin: 4.1 g/dL (ref 3.5–5.0)
Alkaline Phosphatase: 52 U/L (ref 38–126)
Anion gap: 4 — ABNORMAL LOW (ref 5–15)
BUN: 19 mg/dL (ref 8–23)
CO2: 30 mmol/L (ref 22–32)
Calcium: 9.6 mg/dL (ref 8.9–10.3)
Chloride: 105 mmol/L (ref 98–111)
Creatinine: 0.73 mg/dL (ref 0.44–1.00)
GFR, Estimated: >60 mL/min (ref >60)
Glucose, Bld: 76 mg/dL (ref 70–99)
Potassium: 4 mmol/L (ref 3.5–5.1)
Sodium: 139 mmol/L (ref 135–145)
Total Bilirubin: 1 mg/dL (ref 0.3–1.2)
Total Protein: 7.4 g/dL (ref 6.5–8.1)

## 2021-10-21 LAB — CBC WITH DIFFERENTIAL (CANCER CENTER ONLY)
Abs Immature Granulocytes: 0.02 10*3/uL (ref 0.00–0.07)
Basophils Absolute: 0 10*3/uL (ref 0.0–0.1)
Basophils Relative: 0 %
Eosinophils Absolute: 0 10*3/uL (ref 0.0–0.5)
Eosinophils Relative: 0 %
HCT: 44.7 % (ref 36.0–46.0)
Hemoglobin: 15.3 g/dL — ABNORMAL HIGH (ref 12.0–15.0)
Immature Granulocytes: 0 %
Lymphocytes Relative: 54 %
Lymphs Abs: 3.4 10*3/uL (ref 0.7–4.0)
MCH: 30.8 pg (ref 26.0–34.0)
MCHC: 34.2 g/dL (ref 30.0–36.0)
MCV: 90.1 fL (ref 80.0–100.0)
Monocytes Absolute: 0.5 10*3/uL (ref 0.1–1.0)
Monocytes Relative: 8 %
Neutro Abs: 2.5 10*3/uL (ref 1.7–7.7)
Neutrophils Relative %: 38 %
Platelet Count: 103 10*3/uL — ABNORMAL LOW (ref 150–400)
RBC: 4.96 MIL/uL (ref 3.87–5.11)
RDW: 13.2 % (ref 11.5–15.5)
WBC Count: 6.5 10*3/uL (ref 4.0–10.5)
nRBC: 0 % (ref 0.0–0.2)

## 2021-10-21 LAB — LACTATE DEHYDROGENASE: LDH: 128 U/L (ref 98–192)

## 2021-10-27 ENCOUNTER — Encounter: Payer: Self-pay | Admitting: Hematology

## 2021-10-27 NOTE — Progress Notes (Signed)
HEMATOLOGY/ONCOLOGY CLINIC NOTE  Date of Service: 10/21/2021    Patient Care Team: [name], NP as PCP - General (Geriatric Medicine)  CHIEF COMPLAINTS/PURPOSE OF CONSULTATION:  Follow-up for continued evaluation and management of relapsed marginal zone lymphoma  ONCOLOGIC Hx:  B-cell lymphoproliferative disorder, NOS, diagnosed originally by fine needle aspiration of right groin lymph node on 10/25/12  S/p excisional biopsy of right inguinal lymph node on 12/08/12 with finding of B cell lymphoproliferative disorder with indistinct phenotype.  PET and CT scans performed on 12/21/12 consistent with clinical stage disease with adenopathy in both inguinal and femoral areas and left posterior triangle in the neck and probably the left axilla.  Completion of five cycles of CVP-R from 05/05/18 through 08/18/18.   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 She continues to be on ibrutinib at 420 mg p.o. daily. Notes continued decrease in size of her inguinal lymphadenopathy. Minimal grade 1 bruising but no other notable toxicities from the ibrutinib. Patient had several questions which were answered in details. Labs done today were reviewed in detail.  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 Son is alternate   Would accept resuscitation   No tube feeds if cognitively unaware   Social Determinants of Health   Financial Resource Strain: Not on file  Food Insecurity: Not on file  Transportation Needs: Not on file  Physical Activity: Not on file  Stress: Not on file  Social Connections: Not on file  Intimate Partner Violence: Not on file    FAMILY HISTORY: Family History  Problem Relation Age of Onset   Alzheimer's disease Mother    Heart failure Father    Prostate cancer Father    Diabetes Maternal Grandfather    Breast cancer Neg Hx     ALLERGIES:  is allergic to cephalosporins and penicillins.  PCN - significant rash  Cephalosporins - significant rash   MEDICATIONS:  Current Outpatient Medications  Medication Sig Dispense Refill   Ascorbic Acid (VITAMIN C) 1000 MG tablet Take 1 tablet by mouth as directed. Takes off and on , sporadically  Cholecalciferol (VITAMIN D3) 50 MCG (2000 UT) capsule Take 1 capsule by mouth as directed. Takes off and of, sporadically     ibrutinib (IMBRUVICA) 140 MG capsule Take 3 capsules (420 mg total) by mouth daily. Take with a glass of water. 90 capsule 2   lisinopril (ZESTRIL) 5 MG tablet TAKE ONE TABLET BY MOUTH DAILY 90 tablet 1   Loratadine 10 MG CAPS Take 1 tablet by mouth daily.     simvastatin  (ZOCOR) 20 MG tablet TAKE ONE TABLET BY MOUTH DAILY 90 tablet 3   temazepam (RESTORIL) 15 MG capsule TAKE ONE CAPSULE BY MOUTH EVERY NIGHT AT BEDTIME AS NEEDED FOR SLEEP 30 capsule 3   vitamin E 180 MG (400 UNITS) capsule Take 400 Units by mouth as directed. Takes off and on, sporadically     No current facility-administered medications for this visit.  Lisinopril 18m -  HTN  Simvastatin 275m- HLD  REVIEW OF SYSTEMS:  . 10 Point review of Systems was done is negative except as noted above.  PHYSICAL EXAMINATION: .BP (!) 144/82 (BP Location: Left Arm, Patient Position: Sitting)   Pulse 87   Temp 98.1 F (36.7 C) (Temporal)   Resp 17   Ht 5' 3"  (1.6 m)   Wt 141 lb 14.4 oz (64.4 kg)   SpO2 98%   BMI 25.14 kg/m  NAD GENERAL:alert, in no acute distress and comfortable SKIN: no acute rashes, no significant lesions EYES: conjunctiva are pink and non-injected, sclera anicteric OROPHARYNX: MMM, no exudates, no oropharyngeal erythema or ulceration NECK: supple, no JVD LYMPH:  no palpable lymphadenopathy in the cervical, axillary or inguinal regions LUNGS: clear to auscultation b/l with normal respiratory effort HEART: regular rate & rhythm ABDOMEN:  normoactive bowel sounds , non tender, not distended. Extremity: no pedal edema PSYCH: alert & oriented x 3 with fluent speech NEURO: no focal motor/sensory deficits  LABORATORY DATA:  I have reviewed the data as listed  .    Latest Ref Rng & Units 10/21/2021   11:33 AM 08/21/2021   10:30 AM 07/24/2021   10:34 AM  CBC  WBC 4.0 - 10.5 K/uL 6.5  4.1  4.0   Hemoglobin 12.0 - 15.0 g/dL 15.3  14.2  13.7   Hematocrit 36.0 - 46.0 % 44.7  43.2  41.1   Platelets 150 - 400 K/uL 103  86  87    .CBC    Component Value Date/Time   WBC 6.5 10/21/2021 1133   WBC 4.6 05/27/2021 0621   RBC 4.96 10/21/2021 1133   HGB 15.3 (H) 10/21/2021 1133   HCT 44.7 10/21/2021 1133   PLT 103 (L) 10/21/2021 1133   MCV 90.1 10/21/2021 1133   MCH 30.8  10/21/2021 1133   MCHC 34.2 10/21/2021 1133   RDW 13.2 10/21/2021 1133   LYMPHSABS 3.4 10/21/2021 1133   MONOABS 0.5 10/21/2021 1133   EOSABS 0.0 10/21/2021 1133   BASOSABS 0.0 10/21/2021 1133        Latest Ref Rng & Units 10/21/2021   11:33 AM 08/21/2021   10:30 AM 07/24/2021   10:34 AM  CMP  Glucose 70 - 99 mg/dL 76  87  101   BUN 8 - 23 mg/dL 19  22  24    Creatinine 0.44 - 1.00 mg/dL 0.73  0.93  0.92   Sodium 135 - 145 mmol/L 139  140  139   Potassium 3.5 - 5.1 mmol/L 4.0  3.9  4.0   Chloride 98 - 111  mmol/L 105  105  104   CO2 22 - 32 mmol/L 30  31  30    Calcium 8.9 - 10.3 mg/dL 9.6  9.3  9.2   Total Protein 6.5 - 8.1 g/dL 7.4  7.1  7.1   Total Bilirubin 0.3 - 1.2 mg/dL 1.0  0.8  0.7   Alkaline Phos 38 - 126 U/L 52  42  43   AST 15 - 41 U/L 16  14  17    ALT 0 - 44 U/L 18  11  14     . Lab Results  Component Value Date   LDH 128 10/21/2021    SURGICAL PATHOLOGY  CASE: [id number]  PATIENT: Sherice Morren  Surgical Pathology Report   Specimen Submitted:  A. Lymph node, right inguinal   Clinical History: History of low grade B cell lymphoma, now with right  inguinal lymphadenopathy, post US guided BX   DIAGNOSIS:  A. LYMPH NODE, RIGHT INGUINAL; ULTRASOUND-GUIDED BIOPSY:  - LOW-GRADE B-CELL LYMPHOMA WITH FEATURES MOST SUGGESTIVE OF MARGINAL  ZONE LYMPHOMA.   Comment:  Sections demonstrate small to medium sized lymphocytes with monocytoid  features.  Scattered plasma cells are present. Residual normal lymphoid  architecture is not identified.  Material was submitted for flow cytometric analysis which revealed a  monoclonal kappa B cell population that was negative for CD5, CD23, and  CD10 (see scanned report in [facility]).  A panel of immunohistochemical stains was performed with the following  pattern of immunoreactivity:  CD20: Positive in neoplastic lymphocytes  CD3: Highlights background T cells in a distribution similar to CD5  CD5: Highlights background T  cells in a distribution similar to CD3  CD23: Highlights focal residual dendritic meshworks  CD10: Negative in neoplastic lymphocytes  BCL-2: Positive in neoplastic lymphocytes  BCL 6: Negative in neoplastic lymphocytes  Cyclin D1: Negative in neoplastic lymphocytes  Sox 11: Negative in neoplastic lymphocytes  Kappa-ISH: Positive in neoplastic lymphocytes  Lambda-ISH: Negative in neoplastic lymphocytes  Ki-67: Approximately 30%  The histologic features in conjunction with the immunophenotypic  findings support the diagnosis of a low-grade mature B-cell lymphoma  most suggestive of marginal zone lymphoma.   Sox 11 IHC and kappa / lambda ISH slides were prepared by [facility] [employer], [place], [place].  The remaining IHC slides were prepared by [facility], [place], [place]. All controls stained appropriately.   This test was developed and its performance characteristics determined  by [facility]. It has not been cleared or approved by the [place] Food and Drug  Administration. The FDA does not require this test to go through  premarket FDA review. This test is used for clinical purposes. It should  not be regarded as investigational or for research. This laboratory is  certified under the Clinical Laboratory Improvement Amendments (CLIA) as  qualified to perform high complexity clinical laboratory testing.   GROSS DESCRIPTION:  A. Labeled: Right inguinal lymph node  Received: The specimen is received fresh on saline soaked gauze.  Tissue fragment(s): Multiple  Size: Range from 0.5 to 1.9 cm in length and 0.1 cm in diameter  Description: Received are multiple cores and fragments of tan-white soft  tissue.  1 touch prep with Diff-Quik stain is performed.  Representative  sections are submitted for flow cytometry in RPMI.  The remainder of the  specimen is submitted in cassettes 1-4 with 1 core per cassette.    Final  Diagnosis performed by Quay Burow, MD.  Electronically signed  08/31/2019 1:38:09PM  The electronic signature indicates that the named Attending Pathologist  has evaluated the specimen  Technical component performed at [facility], [address] [place],  [place] [address] Lab: [phone] Dir: Rush Farmer, MD, MMM   Professional component performed at [facility], [facility], [place], [place], [place] [address] Lab: [phone]  Dir: Dellia Nims. Reuel Derby, MD   RADIOGRAPHIC STUDIES: I have personally reviewed the radiological images as listed and agreed with the findings in the report. No results found.  ASSESSMENT & PLAN:   76 y.o. with   1) Atleast Stage IIIA Low grade B cell Non Hodgkins Lymphoma -consistent with Marginal zone lymphoma S/p R-CVP x  five cycles from 05/05/18 through 08/18/18.  Patient with progression of disease with bulky abdominal pelvic disease with significant right groin bulky adenopathy. Patient was started on weekly Rituxan and received 2 total weekly treatments on 11/21/2020 and 11/28/2020 and developed serum sickness like reaction which is why it was discontinued.  2) h/o Serum sickness-like reaction to Rituxan. (2nd weekly dose)  3) petechiae on lower extremities after starting ibrutinib -now resolved and has not recurred with reintroduction of ibrutinib  4) mild to moderate epistaxis x2 episodes.-- resolved Patient is now off Celexa which was likely causing platelet dysfunction in addition to her Ibrutinib.  PLAN: -Labs done today were discussed in detail with the patient.  Her hemoglobin has improved to 15.3 platelets are improved to 103k CMP stable Grade 1 bruising and fatigue but no other significant toxicities from her ibrutinib. She notes continued improvement in her right inguinal lymphadenopathy clinically. -We shall continue her ibrutinib at 420 mg p.o. daily at this time We shall get a repeat CT scan prior to her next  follow-up in 2 months to evaluate continued response to treatment. .  FOLLOW UP: Ct chest abd pelvis in 7 weeks RTC with Dr Irene Limbo with labs in 2 months  The total time spent in the appointment was 23 minutes*.  All of the patient's questions were answered with apparent satisfaction. The patient knows to call the clinic with any problems, questions or concerns.   Sullivan Lone MD MS AAHIVMS [facility] Hematology/Oncology Physician [facility]  .*Total Encounter Time as defined by the Centers for Medicare and Medicaid Services includes, in addition to the face-to-face time of a patient visit (documented in the note above) non-face-to-face time: obtaining and reviewing outside history, ordering and reviewing medications, tests or procedures, care coordination (communications with other health care professionals or caregivers) and documentation in the medical record.

## 2021-11-04 ENCOUNTER — Other Ambulatory Visit: Payer: Self-pay | Admitting: Nurse Practitioner

## 2021-11-04 DIAGNOSIS — G479 Sleep disorder, unspecified: Secondary | ICD-10-CM

## 2021-11-04 NOTE — Telephone Encounter (Signed)
Pharmacy requested refill.  Pended Rx and sent to Jessica for approval.  

## 2021-11-08 IMAGING — MG MM DIGITAL SCREENING BILAT W/ TOMO AND CAD
8 series · 8 of 24 positions shown · non-contrast
Comparison: Previous exam(s).

CLINICAL DATA: Screening.

EXAM:
DIGITAL SCREENING BILATERAL MAMMOGRAM WITH TOMOSYNTHESIS AND CAD
TECHNIQUE: Bilateral screening digital craniocaudal and mediolateral oblique
mammograms were obtained. Bilateral screening digital breast
tomosynthesis was performed. The images were evaluated with
computer-aided detection.

[R CC synth-2D]
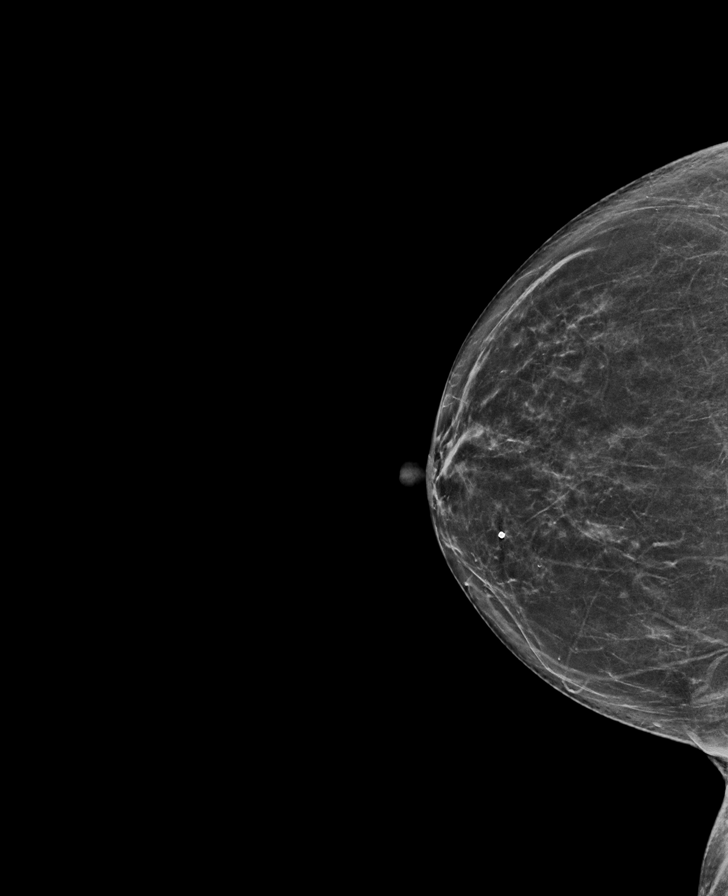

[R MLO synth-2D]
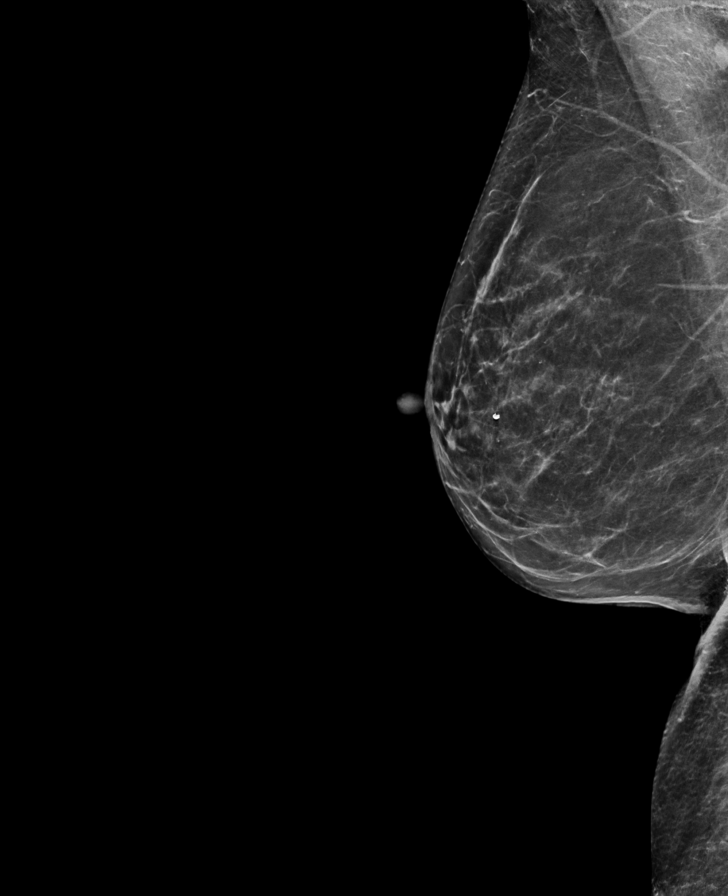

[L MLO synth-2D]
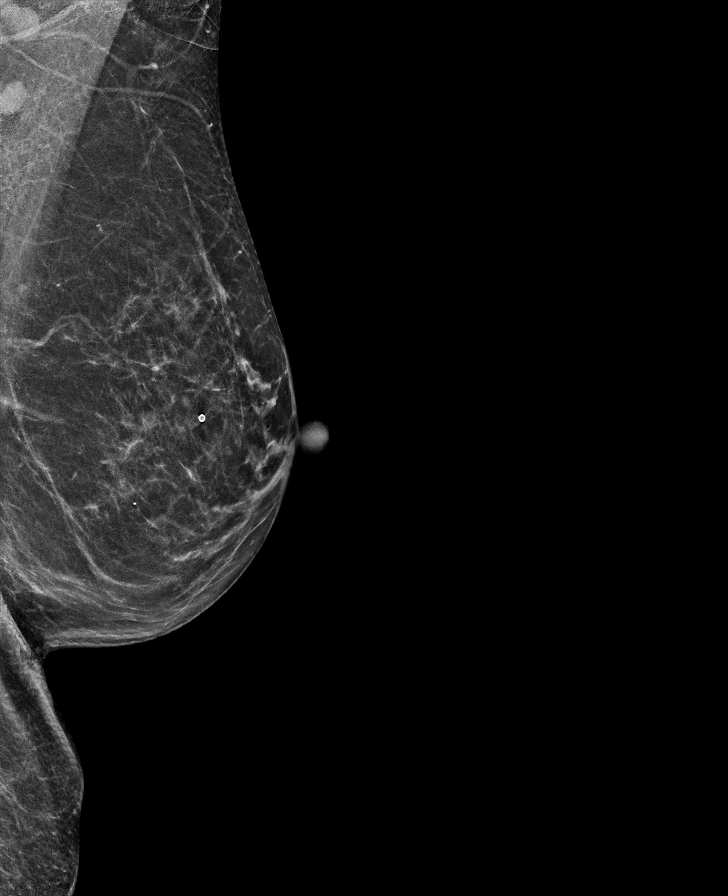

[L CC synth-2D]
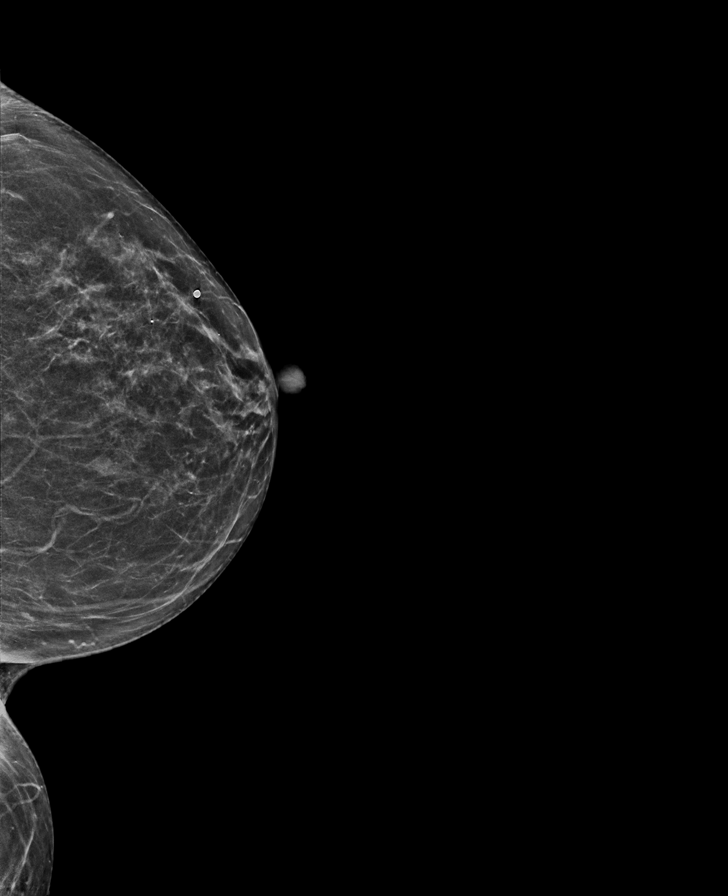

[L MLO tomo · tomo slice 29/56.0]
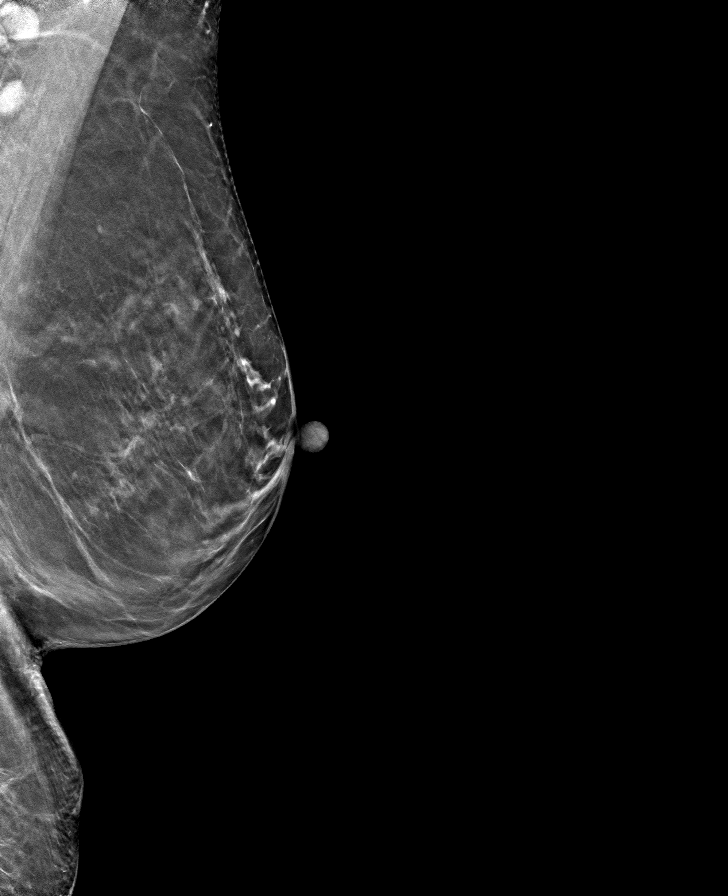

[R CC tomo · tomo slice 33/64.0]
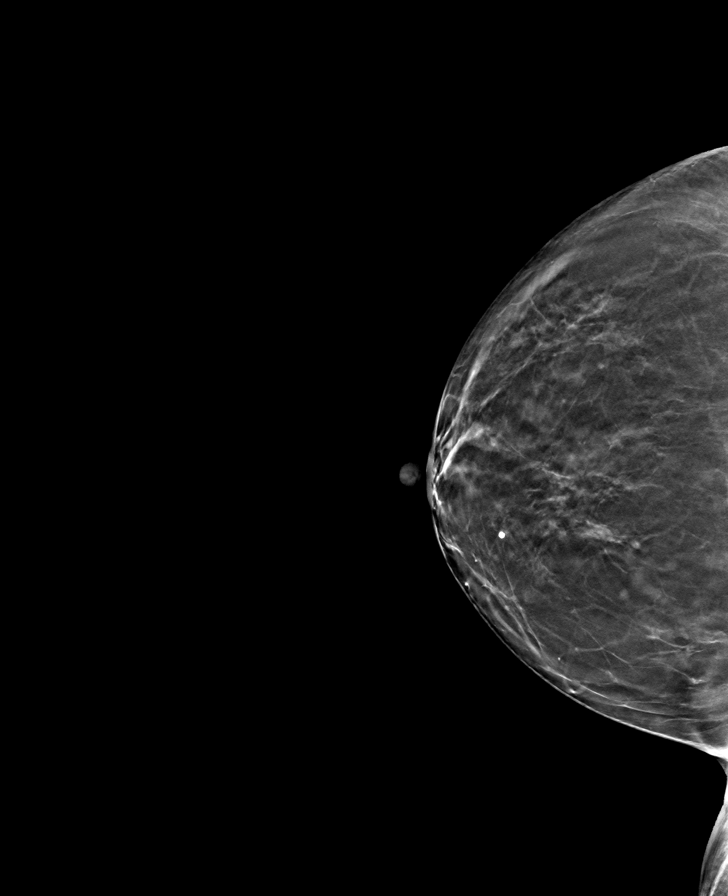

[R MLO tomo · tomo slice 35/70.0]
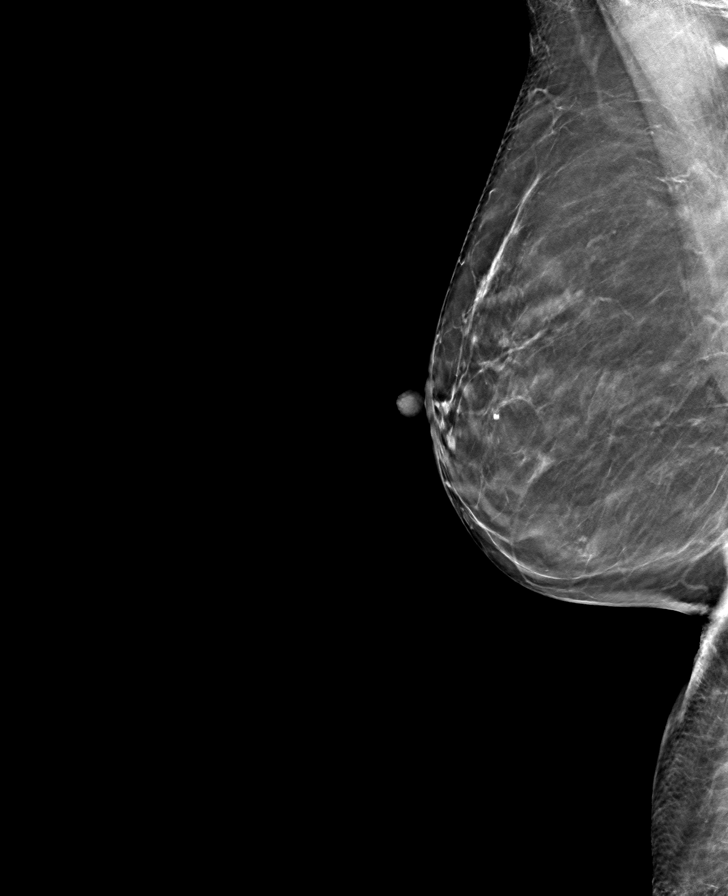

[L CC tomo · tomo slice 31/61.0]
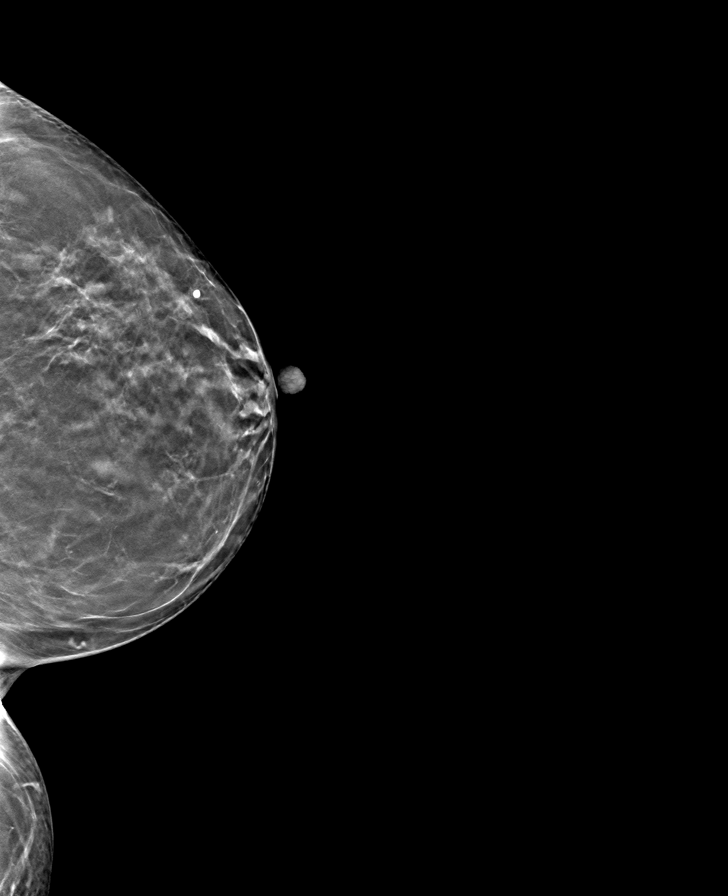

[8 of 24 positions shown; findings below may reference images not displayed]

ACR Breast Density Category b: There are scattered areas of
fibroglandular density.
FINDINGS: There are no findings suspicious for malignancy.
IMPRESSION: No mammographic evidence of malignancy. A result letter of this
screening mammogram will be mailed directly to the patient.

RECOMMENDATION:
Screening mammogram in one year. (Code:51-O-LD2)

BI-RADS CATEGORY  1: Negative.

## 2021-12-03 ENCOUNTER — Ambulatory Visit: Payer: Medicare Other | Admitting: Nurse Practitioner

## 2021-12-03 ENCOUNTER — Other Ambulatory Visit: Payer: Self-pay | Admitting: Nurse Practitioner

## 2021-12-03 ENCOUNTER — Encounter: Payer: Self-pay | Admitting: Nurse Practitioner

## 2021-12-03 VITALS — BP 118/62 | HR 114 | Temp 98.2°F | Resp 18

## 2021-12-03 DIAGNOSIS — R300 Dysuria: Secondary | ICD-10-CM | POA: Diagnosis not present

## 2021-12-03 MED ORDER — SULFAMETHOXAZOLE-TRIMETHOPRIM 800-160 MG PO TABS: 1.0000 | ORAL_TABLET | Freq: Two times a day (BID) | ORAL | 0 refills | Status: DC

## 2021-12-03 NOTE — Progress Notes (Signed)
Careteam: Patient Care Team: [name], NP as PCP - General (Geriatric Medicine)  Advanced Directive information    Allergies  Allergen Reactions   Cephalosporins     Rash   Penicillins     Significant Rash    Chief Complaint  Patient presents with   Acute Visit    Burning when urinating     HPI: Patient is a 76 y.o. female seen in today at the [facility] for pain with urination since yesterday.  Thought if she took azo yesterday it would help but she woke up again this morning with burning and frequency and urgency.  No fever or chills.  No back pain or suprapubic pain  Review of Systems:  Review of Systems  Constitutional:  Negative for chills and fever.  Genitourinary:  Positive for dysuria, frequency and urgency. Negative for flank pain and hematuria.    Past Medical History:  Diagnosis Date   Generalized osteoarthritis    Hyperlipidemia    Hypertension    Mood disorder ([place])    Non Hodgkin's lymphoma ([place])    Sleep disorder    Past Surgical History:  Procedure Laterality Date   ABDOMINAL HYSTERECTOMY  1997   ANKLE ARTHRODESIS Right 2012   BREAST BIOPSY Left 1969   fibroadenoma   CATARACT EXTRACTION W/ INTRAOCULAR LENS IMPLANT Bilateral    LYMPH NODE BIOPSY  2014   excisional biopsy right groin   Thumb surgery     removal of part of extensor tendon   Social History:   reports that she has never smoked. She has never used smokeless tobacco. She reports current alcohol use. She reports that she does not use drugs.  Family History  Problem Relation Age of Onset   Alzheimer's disease Mother    Heart failure Father    Prostate cancer Father    Diabetes Maternal Grandfather    Breast cancer Neg Hx     Medications: Patient's Medications  New Prescriptions   SULFAMETHOXAZOLE-TRIMETHOPRIM (BACTRIM DS) 800-160 MG TABLET    Take 1 tablet by mouth 2 (two) times daily.  Previous Medications   ASCORBIC ACID (VITAMIN C) 1000 MG TABLET     Take 1 tablet by mouth as directed. Takes off and on , sporadically   CHOLECALCIFEROL (VITAMIN D3) 50 MCG (2000 UT) CAPSULE    Take 1 capsule by mouth as directed. Takes off and of, sporadically   IBRUTINIB (IMBRUVICA) 140 MG CAPSULE    Take 3 capsules (420 mg total) by mouth daily. Take with a glass of water.   LISINOPRIL (ZESTRIL) 5 MG TABLET    TAKE ONE TABLET BY MOUTH DAILY   LORATADINE 10 MG CAPS    Take 1 tablet by mouth daily.   SIMVASTATIN (ZOCOR) 20 MG TABLET    TAKE ONE TABLET BY MOUTH DAILY   TEMAZEPAM (RESTORIL) 15 MG CAPSULE    TAKE ONE CAPSULE BY MOUTH EVERY NIGHT AT BEDTIME AS NEEDED FOR SLEEP   VITAMIN E 180 MG (400 UNITS) CAPSULE    Take 400 Units by mouth as directed. Takes off and on, sporadically  Modified Medications   No medications on file  Discontinued Medications   No medications on file    Physical Exam:  Vitals:   12/03/21 1029  BP: 118/62  Pulse: (!) 114  Resp: 18  Temp: 98.2 F (36.8 C)  SpO2: 97%   There is no height or weight on file to calculate BMI. Wt Readings from Last 3  Encounters:  10/21/21 141 lb 14.4 oz (64.4 kg)  09/22/21 140 lb (63.5 kg)  08/21/21 140 lb 6.4 oz (63.7 kg)    Physical Exam Constitutional:      Appearance: Normal appearance.  Cardiovascular:     Rate and Rhythm: Regular rhythm. Tachycardia present.  Pulmonary:     Effort: Pulmonary effort is normal.  Abdominal:     General: Bowel sounds are normal.     Palpations: Abdomen is soft.     Tenderness: There is no abdominal tenderness. There is no right CVA tenderness or left CVA tenderness.  Skin:    General: Skin is warm and dry.  Neurological:     Mental Status: She is alert. Mental status is at baseline.  Psychiatric:        Mood and Affect: Mood normal.     Labs reviewed: Basic Metabolic Panel: Recent Labs    07/24/21 1034 08/21/21 1030 10/21/21 1133  NA 139 140 139  K 4.0 3.9 4.0  CL 104 105 105  CO2 '30 31 30  '$ GLUCOSE 101* 87 76  BUN 24* 22 19   CREATININE 0.92 0.93 0.73  CALCIUM 9.2 9.3 9.6   Liver Function Tests: Recent Labs    07/24/21 1034 08/21/21 1030 10/21/21 1133  AST 17 14* 16  ALT '14 11 18  '$ ALKPHOS 43 42 52  BILITOT 0.7 0.8 1.0  PROT 7.1 7.1 7.4  ALBUMIN 3.6 3.7 4.1   No results for input(s): "LIPASE", "AMYLASE" in the last 8760 hours. No results for input(s): "AMMONIA" in the last 8760 hours. CBC: Recent Labs    07/24/21 1034 08/21/21 1030 10/21/21 1133  WBC 4.0 4.1 6.5  NEUTROABS 1.5* 2.1 2.5  HGB 13.7 14.2 15.3*  HCT 41.1 43.2 44.7  MCV 93.4 92.3 90.1  PLT 87* 86* 103*   Lipid Panel: No results for input(s): "CHOL", "HDL", "LDLCALC", "TRIG", "CHOLHDL", "LDLDIRECT" in the last 8760 hours. TSH: No results for input(s): "TSH" in the last 8760 hours. A1C: No results found for: "HGBA1C"   Assessment/Plan 1. Dysuria -UA C&S -increase hydration.  - sulfamethoxazole-trimethoprim (BACTRIM DS) 800-160 MG tablet; Take 1 tablet by mouth 2 (two) times daily.  Dispense: 10 tablet; Refill: 0   Destenee Guerry K. [place], [facility] [phone]

## 2021-12-08 ENCOUNTER — Telehealth: Payer: Self-pay | Admitting: Nurse Practitioner

## 2021-12-08 NOTE — Telephone Encounter (Signed)
No UTI based on culture, hopefully she is feeling better after antibiotics.

## 2021-12-08 NOTE — Telephone Encounter (Signed)
Called and left vm for patient to call office.

## 2021-12-09 ENCOUNTER — Other Ambulatory Visit: Payer: Self-pay

## 2021-12-09 DIAGNOSIS — C8580 Other specified types of non-Hodgkin lymphoma, unspecified site: Secondary | ICD-10-CM

## 2021-12-09 MED ORDER — IBRUTINIB 140 MG PO CAPS: 420.0000 mg | ORAL_CAPSULE | Freq: Every day | ORAL | 2 refills | Status: DC

## 2021-12-10 NOTE — Telephone Encounter (Signed)
Called and discussed results with patient. She verbalized her understanding and stated that she is feeling better.

## 2021-12-11 ENCOUNTER — Ambulatory Visit
Admission: RE | Admit: 2021-12-11 | Discharge: 2021-12-11 | Disposition: A | Discharge status: Home / Self Care | Payer: Medicare Other | Source: Ambulatory Visit | Attending: Hematology | Admitting: Hematology

## 2021-12-11 ENCOUNTER — Other Ambulatory Visit (HOSPITAL_COMMUNITY): Payer: Medicare Other

## 2021-12-11 DIAGNOSIS — N281 Cyst of kidney, acquired: Secondary | ICD-10-CM | POA: Diagnosis not present

## 2021-12-11 DIAGNOSIS — C8580 Other specified types of non-Hodgkin lymphoma, unspecified site: Secondary | ICD-10-CM | POA: Diagnosis not present

## 2021-12-11 DIAGNOSIS — K7689 Other specified diseases of liver: Secondary | ICD-10-CM | POA: Diagnosis not present

## 2021-12-11 DIAGNOSIS — J9811 Atelectasis: Secondary | ICD-10-CM | POA: Diagnosis not present

## 2021-12-11 LAB — POCT I-STAT CREATININE: Creatinine, Ser: 1.1 mg/dL — ABNORMAL HIGH (ref 0.44–1.00)

## 2021-12-11 MED ORDER — IOHEXOL 300 MG/ML  SOLN
100.0000 mL | Freq: Once | INTRAMUSCULAR | Status: AC | PRN
  Administered 2021-12-11: 100 mL via INTRAVENOUS

## 2021-12-21 ENCOUNTER — Inpatient Hospital Stay: Payer: Medicare Other | Admitting: Hematology

## 2021-12-21 ENCOUNTER — Inpatient Hospital Stay: Payer: Medicare Other

## 2021-12-21 NOTE — Progress Notes (Shared)
HEMATOLOGY/ONCOLOGY CLINIC NOTE  Date of Service: 12/21/21    Patient Care Team: [name], NP as PCP - General (Geriatric Medicine)  CHIEF COMPLAINTS/PURPOSE OF CONSULTATION:  Follow-up for continued evaluation and management of relapsed marginal zone lymphoma  ONCOLOGIC Hx:  B-cell lymphoproliferative disorder, NOS, diagnosed originally by fine needle aspiration of right groin lymph node on 10/25/12  S/p excisional biopsy of right inguinal lymph node on 12/08/12 with finding of B cell lymphoproliferative disorder with indistinct phenotype.  PET and CT scans performed on 12/21/12 consistent with clinical stage disease with adenopathy in both inguinal and femoral areas and left posterior triangle in the neck and probably the left axilla.  Completion of five cycles of CVP-R from 05/05/18 through 08/18/18.   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 female is here with her husband for continued evaluation and management of her marginal zone lymphoma. She continues to be on ibrutinib at 420 mg p.o. daily.  Patient was last seen by me on 10/21/2021 and was doing well with minimal grade 1 bruising.    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 Son is alternate   Would accept resuscitation   No tube feeds if cognitively unaware   Social Determinants of Health   Financial Resource Strain: Not on file  Food Insecurity: Not on file  Transportation Needs: Not on file  Physical Activity: Not on file  Stress: Not on file  Social Connections: Not on file  Intimate Partner Violence: Not on file    FAMILY HISTORY: Family History  Problem Relation Age of Onset   Alzheimer's disease Mother    Heart failure Father    Prostate cancer Father    Diabetes Maternal Grandfather    Breast cancer Neg Hx     ALLERGIES:  is allergic to cephalosporins and penicillins.  PCN - significant rash  Cephalosporins - significant rash   MEDICATIONS:  Current Outpatient Medications  Medication Sig Dispense Refill   Ascorbic Acid (VITAMIN C) 1000 MG tablet Take 1 tablet by mouth as directed. Takes off and on , sporadically     Cholecalciferol (VITAMIN D3) 50 MCG (2000 UT) capsule Take 1 capsule by mouth as directed.  Takes off and of, sporadically     ibrutinib (IMBRUVICA) 140 MG capsule Take 3 capsules (420 mg total) by mouth daily. Take with a glass of water. 90 capsule 2   lisinopril (ZESTRIL) 5 MG tablet TAKE ONE TABLET BY MOUTH DAILY 90 tablet 1   Loratadine 10 MG CAPS Take 1 tablet by mouth daily.     simvastatin (ZOCOR) 20 MG tablet TAKE ONE TABLET BY MOUTH DAILY 90 tablet 3   sulfamethoxazole-trimethoprim (BACTRIM DS) 800-160 MG tablet Take 1 tablet by mouth 2  (two) times daily. 10 tablet 0   temazepam (RESTORIL) 15 MG capsule TAKE ONE CAPSULE BY MOUTH EVERY NIGHT AT BEDTIME AS NEEDED FOR SLEEP 30 capsule 5   vitamin E 180 MG (400 UNITS) capsule Take 400 Units by mouth as directed. Takes off and on, sporadically     No current facility-administered medications for this visit.  Lisinopril 69m -  HTN  Simvastatin 224m- HLD  REVIEW OF SYSTEMS:  . 10 Point review of Systems was done is negative except as noted above.  PHYSICAL EXAMINATION: .There were no vitals taken for this visit. NAD GENERAL:alert, in no acute distress and comfortable SKIN: no acute rashes, no significant lesions EYES: conjunctiva are pink and non-injected, sclera anicteric OROPHARYNX: MMM, no exudates, no oropharyngeal erythema or ulceration NECK: supple, no JVD LYMPH:  no palpable lymphadenopathy in the cervical, axillary or inguinal regions LUNGS: clear to auscultation b/l with normal respiratory effort HEART: regular rate & rhythm ABDOMEN:  normoactive bowel sounds , non tender, not distended. Extremity: no pedal edema PSYCH: alert & oriented x 3 with fluent speech NEURO: no focal motor/sensory deficits  LABORATORY DATA:  I have reviewed the data as listed  .    Latest Ref Rng & Units 10/21/2021   11:33 AM 08/21/2021   10:30 AM 07/24/2021   10:34 AM  CBC  WBC 4.0 - 10.5 K/uL 6.5  4.1  4.0   Hemoglobin 12.0 - 15.0 g/dL 15.3  14.2  13.7   Hematocrit 36.0 - 46.0 % 44.7  43.2  41.1   Platelets 150 - 400 K/uL 103  86  87    .CBC    Component Value Date/Time   WBC 6.5 10/21/2021 1133   WBC 4.6 05/27/2021 0621   RBC 4.96 10/21/2021 1133   HGB 15.3 (H) 10/21/2021 1133   HCT 44.7 10/21/2021 1133   PLT 103 (L) 10/21/2021 1133   MCV 90.1 10/21/2021 1133   MCH 30.8 10/21/2021 1133   MCHC 34.2 10/21/2021 1133   RDW 13.2 10/21/2021 1133   LYMPHSABS 3.4 10/21/2021 1133   MONOABS 0.5 10/21/2021 1133   EOSABS 0.0 10/21/2021 1133   BASOSABS 0.0 10/21/2021 1133         Latest Ref Rng & Units 12/11/2021   10:57 AM 10/21/2021   11:33 AM 08/21/2021   10:30 AM  CMP  Glucose 70 - 99 mg/dL  76  87   BUN 8 - 23 mg/dL  19  22   Creatinine 0.44 - 1.00 mg/dL 1.10  0.73  0.93   Sodium 135 - 145 mmol/L  139  140   Potassium 3.5 - 5.1 mmol/L  4.0  3.9   Chloride 98 - 111 mmol/L  105  105   CO2 22 - 32 mmol/L  30  31   Calcium 8.9 - 10.3 mg/dL  9.6  9.3   Total Protein 6.5 - 8.1 g/dL  7.4  7.1   Total Bilirubin  0.3 - 1.2 mg/dL  1.0  0.8   Alkaline Phos 38 - 126 U/L  52  42   AST 15 - 41 U/L  16  14   ALT 0 - 44 U/L  18  11    . Lab Results  Component Value Date   LDH 128 10/21/2021    SURGICAL PATHOLOGY  CASE: [id number]  PATIENT: Kamryn Lanz  Surgical Pathology Report   Specimen Submitted:  A. Lymph node, right inguinal   Clinical History: History of low grade B cell lymphoma, now with right  inguinal lymphadenopathy, post US guided BX   DIAGNOSIS:  A. LYMPH NODE, RIGHT INGUINAL; ULTRASOUND-GUIDED BIOPSY:  - LOW-GRADE B-CELL LYMPHOMA WITH FEATURES MOST SUGGESTIVE OF MARGINAL  ZONE LYMPHOMA.   Comment:  Sections demonstrate small to medium sized lymphocytes with monocytoid  features.  Scattered plasma cells are present. Residual normal lymphoid  architecture is not identified.  Material was submitted for flow cytometric analysis which revealed a  monoclonal kappa B cell population that was negative for CD5, CD23, and  CD10 (see scanned report in [facility]).  A panel of immunohistochemical stains was performed with the following  pattern of immunoreactivity:  CD20: Positive in neoplastic lymphocytes  CD3: Highlights background T cells in a distribution similar to CD5  CD5: Highlights background T cells in a distribution similar to CD3  CD23: Highlights focal residual dendritic meshworks  CD10: Negative in neoplastic lymphocytes  BCL-2: Positive in neoplastic lymphocytes  BCL 6: Negative in neoplastic lymphocytes  Cyclin D1:  Negative in neoplastic lymphocytes  Sox 11: Negative in neoplastic lymphocytes  Kappa-ISH: Positive in neoplastic lymphocytes  Lambda-ISH: Negative in neoplastic lymphocytes  Ki-67: Approximately 30%  The histologic features in conjunction with the immunophenotypic  findings support the diagnosis of a low-grade mature B-cell lymphoma  most suggestive of marginal zone lymphoma.   Sox 11 IHC and kappa / lambda ISH slides were prepared by [facility] for [employer], [place], [place].  The remaining IHC slides were prepared by [facility] for Molecular  Biology and Pathology, [place], [place]. All controls stained appropriately.   This test was developed and its performance characteristics determined  by [facility]. It has not been cleared or approved by the [place] Food and Drug  Administration. The FDA does not require this test to go through  premarket FDA review. This test is used for clinical purposes. It should  not be regarded as investigational or for research. This laboratory is  certified under the Clinical Laboratory Improvement Amendments (CLIA) as  qualified to perform high complexity clinical laboratory testing.   GROSS DESCRIPTION:  A. Labeled: Right inguinal lymph node  Received: The specimen is received fresh on saline soaked gauze.  Tissue fragment(s): Multiple  Size: Range from 0.5 to 1.9 cm in length and 0.1 cm in diameter  Description: Received are multiple cores and fragments of tan-white soft  tissue.  1 touch prep with Diff-Quik stain is performed.  Representative  sections are submitted for flow cytometry in RPMI.  The remainder of the  specimen is submitted in cassettes 1-4 with 1 core per cassette.    Final Diagnosis performed by Quay Burow, MD.   Electronically signed  08/31/2019 1:38:09PM  The electronic signature indicates that the named Attending Pathologist  has evaluated the specimen  Technical component performed at [facility], [address], [place],  [place] [address] Lab: [phone] Dir: Rush Farmer, MD, MMM   Professional component performed at [facility], [facility], [place], [place], [place] [address] Lab: [phone]  Dir: Dellia Nims. Reuel Derby, MD   RADIOGRAPHIC STUDIES: I have personally reviewed the radiological images as listed and agreed with the findings in the report. CT CHEST ABDOMEN PELVIS W CONTRAST  Result Date: 12/13/2021 CLINICAL DATA:  Marginal zone lymphoma, assess treatment response * Tracking Code: BO * EXAM: CT CHEST, ABDOMEN, AND PELVIS WITH CONTRAST TECHNIQUE: Multidetector CT imaging of the chest, abdomen and pelvis was performed following the standard protocol during bolus administration of intravenous contrast. RADIATION DOSE REDUCTION: This exam was performed according to the departmental dose-optimization program which includes automated exposure control, adjustment of the mA and/or kV according to patient size and/or use of iterative reconstruction technique. CONTRAST:  159m OMNIPAQUE IOHEXOL 300 MG/ML SOLN additional oral enteric contrast COMPARISON:  CT abdomen pelvis, 01/28/2021, PET-CT, 09/15/2020 FINDINGS: CT CHEST FINDINGS Cardiovascular: Aortic atherosclerosis. Normal heart size. No pericardial effusion. Mediastinum/Nodes: Unchanged prominent subcentimeter axillary and subpectoral lymph nodes, measuring up to 0.9 x 0.8 cm (series 2, image 14). Slightly diminished right retrocrural lymphadenopathy, measuring up to 1.2 x 1.1 cm, previously 1.8 x 1.6 cm (series 2, image 51). Small hiatal hernia. Thyroid gland, trachea, and esophagus demonstrate no significant findings. Lungs/Pleura: Mild dependent bibasilar scarring and or atelectasis. Small, fat containing right-sided Bochdalek's hernia. No pleural effusion or pneumothorax. Musculoskeletal: No chest wall abnormality. No acute osseous findings. CT ABDOMEN PELVIS FINDINGS Hepatobiliary: No solid liver abnormality is seen.  Simple, benign cyst or hemangioma of the right lobe of the liver, for which no further follow-up or characterization is required. No gallstones, gallbladder wall thickening, or biliary dilatation. Pancreas: Unremarkable. No pancreatic ductal dilatation or surrounding inflammatory changes. Spleen: Splenomegaly, maximum coronal span 15.4 cm, slightly decreased compared to prior examination. Adrenals/Urinary Tract: Adrenal glands are unremarkable. Kidneys are normal, without renal calculi, solid lesion, or hydronephrosis. Bilateral parapelvic renal cysts, benign, which do not reflect hydronephrosis and do not require further follow-up or characterization. Additional simple, benign right renal cortical cysts, which likewise requires no further follow-up or characterization. Bladder is unremarkable. Stomach/Bowel: Stomach is within normal limits. Appendix appears normal. No evidence of bowel wall thickening, distention, or inflammatory changes. Vascular/Lymphatic: Aortic atherosclerosis. Numerous enlarged lymph nodes are again seen throughout the abdomen and pelvis, diminished in size compared to prior examination. Index portacaval node measures 3.4 x 1.3 cm, previously 5.3 x 1.8 cm (series 2, image 60). Index aortocaval node measures 2.4 x 1.7 cm, previously 3.3 x 2.7 cm (series 2, image 70). Index right iliac lymph node measures 3.3 x 2.3 cm, previously 5.7 x 3.6 cm (series 2, image 85). Bulky right inguinal lymph node conglomerate is incompletely imaged on today's examination and difficult to directly compare to prior examination although obviously diminished in size (series 2, image 118). Reproductive: Status post hysterectomy. Other: No abdominal wall hernia or abnormality. No ascites. Musculoskeletal: No acute osseous findings. IMPRESSION: 1. Unchanged prominent subcentimeter axillary and subpectoral lymph nodes. No overt lymphadenopathy in the chest. 2. Numerous enlarged lymph nodes are again seen throughout the  abdomen and pelvis, diminished in size compared to prior examination, consistent with treatment response. 3. Splenomegaly, maximum coronal span 15.4 cm, slightly decreased compared to prior examination. Aortic Atherosclerosis (ICD10-I70.0). Electronically Signed   By: ADelanna AhmadiM.D.   On: 12/13/2021 17:43    ASSESSMENT & PLAN:   76y.o. with   1) Atleast Stage IIIA Low grade B cell Non Hodgkins Lymphoma -consistent with Marginal zone lymphoma S/p R-CVP x  five cycles from 05/05/18 through 08/18/18.  Patient with progression of disease with bulky abdominal pelvic disease with significant right groin bulky adenopathy. Patient was started on weekly Rituxan and received 2 total weekly treatments on 11/21/2020 and 11/28/2020 and developed serum sickness like reaction which is why it was discontinued.  2) h/o Serum sickness-like reaction to Rituxan. (2nd weekly dose)  3) petechiae on lower extremities after starting ibrutinib -now resolved and has not recurred with reintroduction of ibrutinib  4) mild to moderate epistaxis x2 episodes.-- resolved Patient is now off Celexa which was likely causing platelet dysfunction in addition to her Ibrutinib.  PLAN: -Labs done today were discussed in detail with the patient.  Her hemoglobin has improved to 15.3 platelets are improved to 103k CMP stable Grade 1 bruising and fatigue but no other significant toxicities from her ibrutinib. She notes continued improvement in her right inguinal lymphadenopathy clinically. -We shall continue her ibrutinib at 420 mg p.o. daily at this time We shall get a repeat CT scan prior to her next follow-up in 2 months to evaluate continued response to treatment. .  FOLLOW UP: ***  The total time spent in the appointment was *** minutes* .  All of the patient's questions were answered with apparent satisfaction. The patient knows to call the clinic with any problems, questions or concerns.   Zettie Cooley, am acting  as a scribe for Sullivan Lone, MD.  Sullivan Lone MD [place] AAHIVMS [facility] Hematology/Oncology Physician [facility]  .*Total Encounter Time as defined by the Centers for Medicare and Medicaid Services includes, in addition to the face-to-face time of a patient visit (documented in the note above) non-face-to-face time: obtaining and reviewing outside history, ordering and reviewing medications, tests or procedures, care coordination (communications with other health care professionals or caregivers) and documentation in the medical record.

## 2021-12-28 ENCOUNTER — Ambulatory Visit: Payer: Medicare Other | Admitting: Hematology

## 2021-12-28 ENCOUNTER — Other Ambulatory Visit: Payer: Medicare Other

## 2021-12-28 ENCOUNTER — Encounter: Payer: Self-pay | Admitting: Hematology

## 2021-12-28 NOTE — Progress Notes (Incomplete)
HEMATOLOGY/ONCOLOGY CLINIC NOTE  Date of Service: 12/28/21  Patient Care Team: [name], NP as PCP - General (Geriatric Medicine)  CHIEF COMPLAINTS/PURPOSE OF CONSULTATION:  Follow-up for continued evaluation and management of relapsed marginal zone lymphoma  ONCOLOGIC Hx:  B-cell lymphoproliferative disorder, NOS, diagnosed originally by fine needle aspiration of right groin lymph node on 10/25/12  S/p excisional biopsy of right inguinal lymph node on 12/08/12 with finding of B cell lymphoproliferative disorder with indistinct phenotype.  PET and CT scans performed on 12/21/12 consistent with clinical stage disease with adenopathy in both inguinal and femoral areas and left posterior triangle in the neck and probably the left axilla.  Completion of five cycles of CVP-R from 05/05/18 through 08/18/18.   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 female is here for continued evaluation and management of her marginal zone lymphoma. She was last seen by me on 10/21/21 and doing well overall. She continued on Ibrutinib 451m po daily at that time with some bruising but no other notable toxicities.   Today, ***  MEDICAL HISTORY:  Past Medical History:  Diagnosis Date   Generalized osteoarthritis    H/O benign breast biopsy 1964   H/O bilateral cataract extraction 2005   Hyperlipidemia    Hypertension    Mood disorder ([place]    Non Hodgkin's lymphoma ([place]    Pancreatitis 10/20/2012   unknown etiology   Postmenopausal    S/P ankle arthrodesis 2012   Right   Sleep disorder    Uterine fibroid     SURGICAL HISTORY: Past Surgical History:  Procedure Laterality Date   ABDOMINAL HYSTERECTOMY  1997   ANKLE ARTHRODESIS Right 2012   BREAST BIOPSY  Left 1969   fibroadenoma   CATARACT EXTRACTION W/ INTRAOCULAR LENS IMPLANT Bilateral    LYMPH NODE BIOPSY  2014   excisional biopsy right groin   Thumb surgery     removal of part of extensor tendon    SOCIAL HISTORY: Social History   Socioeconomic History   Marital status: Married    Spouse name: Not on file   Number of children: 2   Years of education: Not on file   Highest education level: Not on file  Occupational History   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 Son is alternate   Would accept resuscitation   No tube feeds if cognitively unaware   Social Determinants of Health   Financial Resource Strain: Not on file  Food Insecurity: Not on file  Transportation Needs: Not on file  Physical Activity: Not on file  Stress: Not on file  Social Connections: Not on file  Intimate Partner Violence: Not on file    FAMILY  HISTORY: Family History  Problem Relation Age of Onset   Alzheimer's disease Mother    Heart failure Father    Prostate cancer Father    Diabetes Maternal Grandfather    Breast cancer Neg Hx     ALLERGIES:  is allergic to cephalosporins and penicillins.    MEDICATIONS:  Current Outpatient Medications  Medication Sig Dispense Refill   Ascorbic Acid (VITAMIN C) 1000 MG tablet Take 1 tablet by mouth as directed. Takes off and on , sporadically     Cholecalciferol (VITAMIN D3) 50 MCG (2000 UT) capsule Take 1 capsule by mouth as directed. Takes off and of, sporadically     ibrutinib (IMBRUVICA) 140  MG capsule Take 3 capsules (420 mg total) by mouth daily. Take with a glass of water. 90 capsule 2   lisinopril (ZESTRIL) 5 MG tablet TAKE ONE TABLET BY MOUTH DAILY 90 tablet 1   Loratadine 10 MG CAPS Take 1 tablet by mouth daily.     simvastatin (ZOCOR) 20 MG tablet TAKE ONE TABLET BY MOUTH DAILY 90 tablet 3   sulfamethoxazole-trimethoprim (BACTRIM DS) 800-160 MG tablet Take 1 tablet by mouth 2 (two) times daily. 10 tablet 0   temazepam (RESTORIL) 15 MG capsule TAKE ONE CAPSULE BY MOUTH EVERY NIGHT AT BEDTIME AS NEEDED FOR SLEEP 30 capsule 5   vitamin E 180 MG (400 UNITS) capsule Take 400 Units by mouth as directed. Takes off and on, sporadically     No current facility-administered medications for this visit.  Lisinopril 81m -  HTN  Simvastatin 211m- HLD  REVIEW OF SYSTEMS:  . 10 Point review of Systems was done is negative except as noted above.  PHYSICAL EXAMINATION: .There were no vitals taken for this visit. NAD GENERAL:alert, in no acute distress and comfortable SKIN: no acute rashes, no significant lesions EYES: conjunctiva are pink and non-injected, sclera anicteric OROPHARYNX: MMM, no exudates, no oropharyngeal erythema or ulceration NECK: supple, no JVD LYMPH:  no palpable lymphadenopathy in the cervical, axillary or inguinal regions LUNGS: clear to auscultation b/l with normal respiratory effort HEART: regular rate & rhythm ABDOMEN:  normoactive bowel sounds , non tender, not distended. Extremity: no pedal edema PSYCH: alert & oriented x 3 with fluent speech NEURO: no focal motor/sensory deficits  LABORATORY DATA:  I have reviewed the data as listed  .    Latest Ref Rng & Units 10/21/2021   11:33 AM 08/21/2021   10:30 AM 07/24/2021   10:34 AM  CBC  WBC 4.0 - 10.5 K/uL 6.5  4.1  4.0   Hemoglobin 12.0 - 15.0 g/dL 15.3  14.2  13.7   Hematocrit 36.0 - 46.0 % 44.7  43.2  41.1   Platelets 150 - 400 K/uL 103  86  87    .CBC    Component Value Date/Time   WBC 6.5  10/21/2021 1133   WBC 4.6 05/27/2021 0621   RBC 4.96 10/21/2021 1133   HGB 15.3 (H) 10/21/2021 1133   HCT 44.7 10/21/2021 1133   PLT 103 (L) 10/21/2021 1133   MCV 90.1 10/21/2021 1133   MCH 30.8 10/21/2021 1133   MCHC 34.2 10/21/2021 1133   RDW 13.2 10/21/2021 1133   LYMPHSABS 3.4 10/21/2021 1133   MONOABS 0.5 10/21/2021 1133   EOSABS 0.0 10/21/2021 1133   BASOSABS 0.0 10/21/2021 1133        Latest Ref Rng & Units 12/11/2021   10:57 AM 10/21/2021   11:33 AM 08/21/2021   10:30 AM  CMP  Glucose 70 - 99 mg/dL  76  87   BUN 8 - 23 mg/dL  19  22   Creatinine 0.44 - 1.00 mg/dL 1.10  0.73  0.93   Sodium 135 - 145 mmol/L  139  140   Potassium 3.5 - 5.1 mmol/L  4.0  3.9   Chloride 98 - 111 mmol/L  105  105   CO2 22 - 32 mmol/L  30  31   Calcium 8.9 - 10.3 mg/dL  9.6  9.3   Total Protein 6.5 - 8.1 g/dL  7.4  7.1   Total Bilirubin 0.3 - 1.2 mg/dL  1.0  0.8   Alkaline Phos 38 - 126 U/L  52  42   AST 15 - 41 U/L  16  14   ALT 0 - 44 U/L  18  11    . Lab Results  Component Value Date   LDH 128 10/21/2021    SURGICAL PATHOLOGY  CASE: [id number]  PATIENT: Kandiss Llorens  Surgical Pathology Report   Specimen Submitted:  A. Lymph node, right inguinal   Clinical History: History of low grade B cell lymphoma, now with right  inguinal lymphadenopathy, post US guided BX   DIAGNOSIS:  A. LYMPH NODE, RIGHT INGUINAL; ULTRASOUND-GUIDED BIOPSY:  - LOW-GRADE B-CELL LYMPHOMA WITH FEATURES MOST SUGGESTIVE OF MARGINAL  ZONE LYMPHOMA.   Comment:  Sections demonstrate small to medium sized lymphocytes with monocytoid  features.  Scattered plasma cells are present. Residual normal lymphoid  architecture is not identified.  Material was submitted for flow cytometric analysis which revealed a  monoclonal kappa B cell population that was negative for CD5, CD23, and  CD10 (see scanned report in [facility]).  A panel of immunohistochemical stains was performed with the following  pattern of  immunoreactivity:  CD20: Positive in neoplastic lymphocytes  CD3: Highlights background T cells in a distribution similar to CD5  CD5: Highlights background T cells in a distribution similar to CD3  CD23: Highlights focal residual dendritic meshworks  CD10: Negative in neoplastic lymphocytes  BCL-2: Positive in neoplastic lymphocytes  BCL 6: Negative in neoplastic lymphocytes  Cyclin D1: Negative in neoplastic lymphocytes  Sox 11: Negative in neoplastic lymphocytes  Kappa-ISH: Positive in neoplastic lymphocytes  Lambda-ISH: Negative in neoplastic lymphocytes  Ki-67: Approximately 30%  The histologic features in conjunction with the immunophenotypic  findings support the diagnosis of a low-grade mature B-cell lymphoma  most suggestive of marginal zone lymphoma.   Sox 11 IHC and kappa / lambda ISH slides were prepared by [facility] [employer], [place], [place].  The remaining IHC slides were prepared by [facility], [place], [place]. All controls stained appropriately.   This test was developed and its performance characteristics determined  by [facility]. It has not been cleared or approved by the [place] Food and Drug  Administration. The FDA does not require this test to go through  premarket FDA review. This test is used for clinical purposes. It should  not be regarded as investigational or for research. This laboratory is  certified under the Clinical Laboratory Improvement Amendments (CLIA) as  qualified to perform high complexity clinical laboratory testing.   GROSS DESCRIPTION:  A. Labeled: Right inguinal lymph node  Received: The specimen is received fresh on saline soaked gauze.  Tissue fragment(s): Multiple  Size: Range from 0.5 to 1.9 cm in length and 0.1 cm in diameter  Description: Received are multiple cores and fragments  of tan-white soft  tissue.  1 touch prep with Diff-Quik stain is performed.  Representative   sections are submitted for flow cytometry in RPMI.  The remainder of the  specimen is submitted in cassettes 1-4 with 1 core per cassette.    Final Diagnosis performed by Quay Burow, MD.   Electronically signed  08/31/2019 1:38:09PM  The electronic signature indicates that the named Attending Pathologist  has evaluated the specimen  Technical component performed at [facility], [address] [place],  [place] [address] Lab: [phone] Dir: Rush Farmer, MD, MMM   Professional component performed at [facility], [facility], [place], [place], [place] [address] Lab: [phone]  Dir: Dellia Nims. Reuel Derby, MD   RADIOGRAPHIC STUDIES: I have personally reviewed the radiological images as listed and agreed with the findings in the report. CT CHEST ABDOMEN PELVIS W CONTRAST  Result Date: 12/13/2021 CLINICAL DATA:  Marginal zone lymphoma, assess treatment response * Tracking Code: BO * EXAM: CT CHEST, ABDOMEN, AND PELVIS WITH CONTRAST TECHNIQUE: Multidetector CT imaging of the chest, abdomen and pelvis was performed following the standard protocol during bolus administration of intravenous contrast. RADIATION DOSE REDUCTION: This exam was performed according to the departmental dose-optimization program which includes automated exposure control, adjustment of the mA and/or kV according to patient size and/or use of iterative reconstruction technique. CONTRAST:  192m OMNIPAQUE IOHEXOL 300 MG/ML SOLN additional oral enteric contrast COMPARISON:  CT abdomen pelvis, 01/28/2021, PET-CT, 09/15/2020 FINDINGS: CT CHEST FINDINGS Cardiovascular: Aortic atherosclerosis. Normal heart size. No pericardial effusion. Mediastinum/Nodes: Unchanged prominent subcentimeter axillary and subpectoral lymph nodes, measuring up to 0.9 x 0.8 cm (series 2, image 14). Slightly diminished right retrocrural lymphadenopathy, measuring up to 1.2 x 1.1 cm, previously 1.8 x 1.6 cm (series 2, image 51). Small hiatal  hernia. Thyroid gland, trachea, and esophagus demonstrate no significant findings. Lungs/Pleura: Mild dependent bibasilar scarring and or atelectasis. Small, fat containing right-sided Bochdalek's hernia. No pleural effusion or pneumothorax. Musculoskeletal: No chest wall abnormality. No acute osseous findings. CT ABDOMEN PELVIS FINDINGS Hepatobiliary: No solid liver abnormality is seen. Simple, benign cyst or hemangioma of the right lobe of the liver, for which no further follow-up or characterization is required. No gallstones, gallbladder wall thickening, or biliary dilatation. Pancreas: Unremarkable. No pancreatic ductal dilatation or surrounding inflammatory changes. Spleen: Splenomegaly, maximum coronal span 15.4 cm, slightly decreased compared to prior examination. Adrenals/Urinary Tract: Adrenal glands are unremarkable. Kidneys are normal, without renal calculi, solid lesion, or hydronephrosis. Bilateral parapelvic renal cysts, benign, which do not reflect hydronephrosis and do not require further follow-up or characterization. Additional simple, benign right renal cortical cysts, which likewise requires no further follow-up or characterization. Bladder is unremarkable. Stomach/Bowel: Stomach is within normal limits. Appendix appears normal. No evidence of bowel wall thickening, distention, or inflammatory changes. Vascular/Lymphatic: Aortic atherosclerosis. Numerous enlarged lymph nodes are again seen throughout the abdomen and pelvis, diminished in size compared to prior examination. Index portacaval node measures 3.4 x 1.3 cm, previously 5.3 x 1.8 cm (series 2, image 60). Index aortocaval node measures 2.4 x 1.7 cm, previously 3.3 x 2.7 cm (series 2, image 70). Index right iliac lymph node measures 3.3 x 2.3 cm, previously 5.7 x 3.6 cm (series 2, image 85). Bulky right inguinal lymph node conglomerate is incompletely imaged on today's examination and difficult to directly compare to prior examination  although obviously diminished in size (series 2, image 118). Reproductive: Status post hysterectomy. Other: No abdominal wall hernia or abnormality. No ascites. Musculoskeletal: No  acute osseous findings. IMPRESSION: 1. Unchanged prominent subcentimeter axillary and subpectoral lymph nodes. No overt lymphadenopathy in the chest. 2. Numerous enlarged lymph nodes are again seen throughout the abdomen and pelvis, diminished in size compared to prior examination, consistent with treatment response. 3. Splenomegaly, maximum coronal span 15.4 cm, slightly decreased compared to prior examination. Aortic Atherosclerosis (ICD10-I70.0). Electronically Signed   By: Delanna Ahmadi M.D.   On: 12/13/2021 17:43    ASSESSMENT & PLAN:   76 y.o. with   1) Atleast Stage IIIA Low grade B cell Non Hodgkins Lymphoma -consistent with Marginal zone lymphoma S/p R-CVP x  five cycles from 05/05/18 through 08/18/18.  Patient with progression of disease with bulky abdominal pelvic disease with significant right groin bulky adenopathy. Patient was started on weekly Rituxan and received 2 total weekly treatments on 11/21/2020 and 11/28/2020 and developed serum sickness like reaction which is why it was discontinued.  2) h/o Serum sickness-like reaction to Rituxan. (2nd weekly dose)  3) petechiae on lower extremities after starting ibrutinib -now resolved and has not recurred with reintroduction of ibrutinib  4) mild to moderate epistaxis x2 episodes.-- resolved Patient is now off Celexa which was likely causing platelet dysfunction in addition to her Ibrutinib.  PLAN: -Labs done today were discussed in detail with the patient.  Her hemoglobin has improved to 15.3 platelets are improved to 103k CMP stable Grade 1 bruising and fatigue but no other significant toxicities from her ibrutinib. She notes continued improvement in her right inguinal lymphadenopathy clinically. -We shall continue her ibrutinib at 420 mg p.o. daily at  this time We shall get a repeat CT scan prior to her next follow-up in 2 months to evaluate continued response to treatment. .  FOLLOW UP: ***  The total time spent in the appointment was *** minutes* .  All of the patient's questions were answered with apparent satisfaction. The patient knows to call the clinic with any problems, questions or concerns.   Sullivan Lone MD MS AAHIVMS [facility] Hematology/Oncology Physician [facility]  .*Total Encounter Time as defined by the Centers for Medicare and Medicaid Services includes, in addition to the face-to-face time of a patient visit (documented in the note above) non-face-to-face time: obtaining and reviewing outside history, ordering and reviewing medications, tests or procedures, care coordination (communications with other health care professionals or caregivers) and documentation in the medical record.   I,Alexis Herring,acting as a Education administrator for Sullivan Lone, MD.,have documented all relevant documentation on the behalf of Sullivan Lone, MD,as directed by  Sullivan Lone, MD while in the presence of Sullivan Lone, MD.  ***

## 2022-01-18 ENCOUNTER — Inpatient Hospital Stay: Payer: Medicare Other | Attending: Hematology

## 2022-01-18 ENCOUNTER — Other Ambulatory Visit: Payer: Self-pay

## 2022-01-18 ENCOUNTER — Inpatient Hospital Stay: Payer: Medicare Other | Admitting: Hematology

## 2022-01-18 VITALS — BP 138/82 | HR 85 | Temp 97.9°F | Resp 15 | Wt 144.9 lb

## 2022-01-18 DIAGNOSIS — C8580 Other specified types of non-Hodgkin lymphoma, unspecified site: Secondary | ICD-10-CM

## 2022-01-18 DIAGNOSIS — Z79899 Other long term (current) drug therapy: Secondary | ICD-10-CM | POA: Diagnosis not present

## 2022-01-18 DIAGNOSIS — C8300 Small cell B-cell lymphoma, unspecified site: Secondary | ICD-10-CM | POA: Diagnosis not present

## 2022-01-18 DIAGNOSIS — R413 Other amnesia: Secondary | ICD-10-CM | POA: Diagnosis not present

## 2022-01-18 LAB — CMP (CANCER CENTER ONLY)
ALT: 13 U/L (ref 0–44)
AST: 13 U/L — ABNORMAL LOW (ref 15–41)
Albumin: 4 g/dL (ref 3.5–5.0)
Alkaline Phosphatase: 49 U/L (ref 38–126)
Anion gap: 3 — ABNORMAL LOW (ref 5–15)
BUN: 19 mg/dL (ref 8–23)
CO2: 33 mmol/L — ABNORMAL HIGH (ref 22–32)
Calcium: 9.8 mg/dL (ref 8.9–10.3)
Chloride: 105 mmol/L (ref 98–111)
Creatinine: 0.88 mg/dL (ref 0.44–1.00)
GFR, Estimated: >60 mL/min (ref >60)
Glucose, Bld: 74 mg/dL (ref 70–99)
Potassium: 4.3 mmol/L (ref 3.5–5.1)
Sodium: 141 mmol/L (ref 135–145)
Total Bilirubin: 0.9 mg/dL (ref 0.3–1.2)
Total Protein: 7.5 g/dL (ref 6.5–8.1)

## 2022-01-18 LAB — CBC WITH DIFFERENTIAL (CANCER CENTER ONLY)
Abs Immature Granulocytes: 0.03 10*3/uL (ref 0.00–0.07)
Basophils Absolute: 0 10*3/uL (ref 0.0–0.1)
Basophils Relative: 0 %
Eosinophils Absolute: 0 10*3/uL (ref 0.0–0.5)
Eosinophils Relative: 0 %
HCT: 45.3 % (ref 36.0–46.0)
Hemoglobin: 15 g/dL (ref 12.0–15.0)
Immature Granulocytes: 1 %
Lymphocytes Relative: 43 %
Lymphs Abs: 1.9 10*3/uL (ref 0.7–4.0)
MCH: 30.7 pg (ref 26.0–34.0)
MCHC: 33.1 g/dL (ref 30.0–36.0)
MCV: 92.6 fL (ref 80.0–100.0)
Monocytes Absolute: 0.4 10*3/uL (ref 0.1–1.0)
Monocytes Relative: 9 %
Neutro Abs: 2.1 10*3/uL (ref 1.7–7.7)
Neutrophils Relative %: 47 %
Platelet Count: 99 10*3/uL — ABNORMAL LOW (ref 150–400)
RBC: 4.89 MIL/uL (ref 3.87–5.11)
RDW: 13.2 % (ref 11.5–15.5)
WBC Count: 4.5 10*3/uL (ref 4.0–10.5)
nRBC: 0 % (ref 0.0–0.2)

## 2022-01-18 LAB — LACTATE DEHYDROGENASE: LDH: 108 U/L (ref 98–192)

## 2022-01-18 NOTE — Progress Notes (Signed)
HEMATOLOGY/ONCOLOGY CLINIC NOTE  Date of Service: 01/18/22  Patient Care Team: [name], NP as PCP - General (Geriatric Medicine)  CHIEF COMPLAINTS/PURPOSE OF CONSULTATION:  Follow-up for continued evaluation and management of relapsed marginal zone lymphoma  ONCOLOGIC Hx:  B-cell lymphoproliferative disorder, NOS, diagnosed originally by fine needle aspiration of right groin lymph node on 10/25/12  S/p excisional biopsy of right inguinal lymph node on 12/08/12 with finding of B cell lymphoproliferative disorder with indistinct phenotype.  PET and CT scans performed on 12/21/12 consistent with clinical stage disease with adenopathy in both inguinal and femoral areas and left posterior triangle in the neck and probably the left axilla.  Completion of five cycles of CVP-R from 05/05/18 through 08/18/18.   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 female is here with her husband for continued evaluation and management of her marginal zone lymphoma.  Patient was last seen by me on 10/21/2021 and was doing well without any new medical concerns.   [name]   [name] She complains of noticing petechiae on her left arm and mild back and extremity cramping, which is the reason she reduced her Imbruvica to 2 tablets. She reports her cramping was usually once a day. Her husband reports that her patient has more issues with insomnia when she takes 3 tablets of Imbruvica.   Patient reports that she is noticing more memory problems.   She denies any new infection  issues, fever, chills, new lumps/bumps, back pain, abdominal pain, and leg swelling.   She regularly takes Vitamin-D 2000 mg supplement.   She has received influenza vaccine, but not COVID-19 booster and RSV vaccine yet. Her husband reports that the patient had mild hand swelling due to the influenza vaccine.   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 Son is alternate   Would accept resuscitation   No tube feeds if cognitively unaware   Social Determinants of Health   Financial Resource Strain: Not on file  Food Insecurity:  Not on file  Transportation Needs: Not on file  Physical Activity: Not on file  Stress: Not on file  Social Connections: Not on file  Intimate Partner Violence: Not on file    FAMILY HISTORY: Family History  Problem Relation Age of Onset   Alzheimer's disease Mother    Heart failure Father    Prostate cancer Father    Diabetes Maternal Grandfather    Breast cancer Neg Hx      ALLERGIES:  is allergic to cephalosporins and penicillins.  PCN - significant rash  Cephalosporins - significant rash   MEDICATIONS:  Current Outpatient Medications  Medication Sig Dispense Refill   Ascorbic Acid (VITAMIN C) 1000 MG tablet Take 1 tablet by mouth as directed. Takes off and on , sporadically     Cholecalciferol (VITAMIN D3) 50 MCG (2000 UT) capsule Take 1 capsule by mouth as directed. Takes off and of, sporadically     ibrutinib (IMBRUVICA) 140 MG capsule Take 3 capsules (420 mg total) by mouth daily. Take with a glass of water. 90 capsule 2   lisinopril (ZESTRIL) 5 MG tablet TAKE ONE TABLET BY MOUTH DAILY 90 tablet 1   Loratadine 10 MG CAPS Take 1 tablet by mouth daily.     simvastatin (ZOCOR) 20 MG tablet TAKE ONE TABLET BY MOUTH DAILY 90 tablet 3   sulfamethoxazole-trimethoprim (BACTRIM DS) 800-160 MG tablet Take 1 tablet by mouth 2 (two) times daily. 10 tablet 0   temazepam (RESTORIL) 15 MG capsule TAKE ONE CAPSULE BY MOUTH EVERY NIGHT AT BEDTIME AS NEEDED FOR SLEEP 30 capsule 5   vitamin E 180 MG (400 UNITS) capsule Take 400 Units by mouth as directed. Takes off and on, sporadically     No current facility-administered medications for this visit.  Lisinopril 67m -  HTN  Simvastatin 216m- HLD  REVIEW OF SYSTEMS:  . 10 Point review of Systems was done is negative except as noted above.  PHYSICAL EXAMINATION: .BP 138/82 (BP Location: Right Arm, Patient Position: Sitting)   Pulse 85   Temp 97.9 F (36.6 C) (Temporal)   Resp 15   Wt 144 lb 14.4 oz (65.7 kg)   SpO2 96%   BMI 25.67 kg/m  NAD GENERAL:alert, in no acute distress and comfortable SKIN: no acute rashes, no significant lesions EYES: conjunctiva are pink and non-injected, sclera anicteric OROPHARYNX: MMM, no exudates, no oropharyngeal erythema or ulceration NECK: supple, no JVD LYMPH:  no palpable lymphadenopathy in the cervical, axillary or inguinal regions LUNGS: clear to auscultation b/l with  normal respiratory effort HEART: regular rate & rhythm ABDOMEN:  normoactive bowel sounds , non tender, not distended. Extremity: no pedal edema PSYCH: alert & oriented x 3 with fluent speech NEURO: no focal motor/sensory deficits  LABORATORY DATA:  I have reviewed the data as listed  .    Latest Ref Rng & Units 01/18/2022   10:45 AM 10/21/2021   11:33 AM 08/21/2021   10:30 AM  CBC  WBC 4.0 - 10.5 K/uL 4.5  6.5  4.1   Hemoglobin 12.0 - 15.0 g/dL 15.0  15.3  14.2   Hematocrit 36.0 - 46.0 % 45.3  44.7  43.2   Platelets 150 - 400 K/uL 99  103  86    .CBC    Component Value Date/Time   WBC 4.5 01/18/2022 1045   WBC 4.6 05/27/2021 0621   RBC 4.89 01/18/2022 1045   HGB 15.0 01/18/2022 1045   HCT 45.3 01/18/2022 1045  PLT 99 (L) 01/18/2022 1045   MCV 92.6 01/18/2022 1045   MCH 30.7 01/18/2022 1045   MCHC 33.1 01/18/2022 1045   RDW 13.2 01/18/2022 1045   LYMPHSABS 1.9 01/18/2022 1045   MONOABS 0.4 01/18/2022 1045   EOSABS 0.0 01/18/2022 1045   BASOSABS 0.0 01/18/2022 1045        Latest Ref Rng & Units 01/18/2022   10:45 AM 12/11/2021   10:57 AM 10/21/2021   11:33 AM  CMP  Glucose 70 - 99 mg/dL 74   76   BUN 8 - 23 mg/dL 19   19   Creatinine 0.44 - 1.00 mg/dL 0.88  1.10  0.73   Sodium 135 - 145 mmol/L 141   139   Potassium 3.5 - 5.1 mmol/L 4.3   4.0   Chloride 98 - 111 mmol/L 105   105   CO2 22 - 32 mmol/L 33   30   Calcium 8.9 - 10.3 mg/dL 9.8   9.6   Total Protein 6.5 - 8.1 g/dL 7.5   7.4   Total Bilirubin 0.3 - 1.2 mg/dL 0.9   1.0   Alkaline Phos 38 - 126 U/L 49   52   AST 15 - 41 U/L 13   16   ALT 0 - 44 U/L 13   18    . Lab Results  Component Value Date   LDH 108 01/18/2022    SURGICAL PATHOLOGY  CASE: [id number]  PATIENT: Sherry Mosley  Surgical Pathology Report   Specimen Submitted:  A. Lymph node, right inguinal   Clinical History: History of low grade B cell lymphoma, now with right  inguinal lymphadenopathy, post US guided BX    DIAGNOSIS:  A. LYMPH NODE, RIGHT INGUINAL; ULTRASOUND-GUIDED BIOPSY:  - LOW-GRADE B-CELL LYMPHOMA WITH FEATURES MOST SUGGESTIVE OF MARGINAL  ZONE LYMPHOMA.   Comment:  Sections demonstrate small to medium sized lymphocytes with monocytoid  features.  Scattered plasma cells are present. Residual normal lymphoid  architecture is not identified.  Material was submitted for flow cytometric analysis which revealed a  monoclonal kappa B cell population that was negative for CD5, CD23, and  CD10 (see scanned report in [facility]).  A panel of immunohistochemical stains was performed with the following  pattern of immunoreactivity:  CD20: Positive in neoplastic lymphocytes  CD3: Highlights background T cells in a distribution similar to CD5  CD5: Highlights background T cells in a distribution similar to CD3  CD23: Highlights focal residual dendritic meshworks  CD10: Negative in neoplastic lymphocytes  BCL-2: Positive in neoplastic lymphocytes  BCL 6: Negative in neoplastic lymphocytes  Cyclin D1: Negative in neoplastic lymphocytes  Sox 11: Negative in neoplastic lymphocytes  Kappa-ISH: Positive in neoplastic lymphocytes  Lambda-ISH: Negative in neoplastic lymphocytes  Ki-67: Approximately 30%  The histologic features in conjunction with the immunophenotypic  findings support the diagnosis of a low-grade mature B-cell lymphoma  most suggestive of marginal zone lymphoma.   Sox 11 IHC and kappa / lambda ISH slides were prepared by [facility] [employer], [place], [place].  The remaining IHC slides were prepared by [facility], [place], [employer]. All controls stained appropriately.   This test was developed and its performance characteristics determined  by [facility]. It has not been cleared or approved by the [place] Food and Drug  Administration. The FDA does not require this test to go through  premarket FDA review. This test is used  for clinical purposes.  It should  not be regarded as investigational or for research. This laboratory is  certified under the Clinical Laboratory Improvement Amendments (CLIA) as  qualified to perform high complexity clinical laboratory testing.   GROSS DESCRIPTION:  A. Labeled: Right inguinal lymph node  Received: The specimen is received fresh on saline soaked gauze.  Tissue fragment(s): Multiple  Size: Range from 0.5 to 1.9 cm in length and 0.1 cm in diameter  Description: Received are multiple cores and fragments of tan-white soft  tissue.  1 touch prep with Diff-Quik stain is performed.  Representative  sections are submitted for flow cytometry in RPMI.  The remainder of the  specimen is submitted in cassettes 1-4 with 1 core per cassette.    Final Diagnosis performed by Quay Burow, MD.   Electronically signed  08/31/2019 1:38:09PM  The electronic signature indicates that the named Attending Pathologist  has evaluated the specimen  Technical component performed at [facility], [address] [place],  [place] [address] Lab: [phone] Dir: Rush Farmer, MD, MMM   Professional component performed at [facility], [facility], [place], [place], [place] [address] Lab: [phone]  Dir: Dellia Nims. Reuel Derby, MD   RADIOGRAPHIC STUDIES: I have personally reviewed the radiological images as listed and agreed with the findings in the report. No results found.  ASSESSMENT & PLAN:   76 y.o. with   1) Atleast Stage IIIA Low grade B cell Non Hodgkins Lymphoma -consistent with Marginal zone lymphoma S/p R-CVP x  five cycles from 05/05/18 through 08/18/18.  Patient with progression of disease with bulky abdominal pelvic disease with significant right groin bulky adenopathy. Patient was started on weekly Rituxan and received 2 total weekly treatments on 11/21/2020 and 11/28/2020 and developed serum sickness like reaction which is why it was discontinued.  2) h/o Serum  sickness-like reaction to Rituxan. (2nd weekly dose)  3) petechiae on lower extremities after starting ibrutinib -now resolved and has not recurred with reintroduction of ibrutinib  4) mild to moderate epistaxis x2 episodes.-- resolved Patient is now off Celexa which was likely causing platelet dysfunction in addition to her Ibrutinib.  PLAN: -Labs done today were discussed in detail with the patient.  Her hemoglobin has improved to 15.0 K, platelets is 99 K, and WBC of 4.5 K. CMP stable. -Recommended to stay well hydrated.  -Reviewed her recent CT scan with the patient and her husband. CT scan shows improvement in LNadenopathy and splenomegaly from [place]. -Patient will take 3 tablets of Imbruvica 140 mg and record if she still notices any side effects.    FOLLOW UP: Phone visit in 12 weeks Labs before prior to phone visit  The total time spent in the appointment was 30 minutes* .  All of the patient's questions were answered with apparent satisfaction. The patient knows to call the clinic with any problems, questions or concerns.   Sullivan Lone MD MS AAHIVMS [facility] Hematology/Oncology Physician [facility]  .*Total Encounter Time as defined by the Centers for Medicare and Medicaid Services includes, in addition to the face-to-face time of a patient visit (documented in the note above) non-face-to-face time: obtaining and reviewing outside history, ordering and reviewing medications, tests or procedures, care coordination (communications with other health care professionals or caregivers) and documentation in the medical record.   I, Cleda Mccreedy, am acting as a Education administrator for Sullivan Lone, MD.  .I have reviewed the above documentation for accuracy and completeness, and I agree with the above. Suzan Slick  Juleen China MD

## 2022-01-24 ENCOUNTER — Encounter: Payer: Self-pay | Admitting: Hematology

## 2022-02-01 ENCOUNTER — Other Ambulatory Visit: Payer: Self-pay

## 2022-02-01 DIAGNOSIS — C8580 Other specified types of non-Hodgkin lymphoma, unspecified site: Secondary | ICD-10-CM

## 2022-02-01 MED ORDER — IBRUTINIB 140 MG PO CAPS: 420.0000 mg | ORAL_CAPSULE | Freq: Every day | ORAL | 2 refills | Status: DC

## 2022-02-12 ENCOUNTER — Telehealth: Payer: Self-pay | Admitting: Pharmacy Technician

## 2022-02-12 ENCOUNTER — Other Ambulatory Visit (HOSPITAL_COMMUNITY): Payer: Self-pay

## 2022-02-12 NOTE — Telephone Encounter (Signed)
Oral Oncology Patient Advocate Encounter  After completing a benefits investigation, prior authorization for Imbruvica is not required at this time through [place].  Patient's copay is $908.81.     [name], CPhT-Adv Oncology Pharmacy Patient [place] Direct Number: [phone]  Fax: [phone]

## 2022-02-12 NOTE — Telephone Encounter (Signed)
Oral Oncology Patient Advocate Encounter   Received notification that patient is due for re-enrollment for assistance for Imbruvia through [facility].   Re-enrollment process has been initiated and will be submitted upon completion of necessary documents.  [employer] phone number [phone].   I will continue to follow until final determination.  [name], CPhT-Adv Oncology Pharmacy Patient [place] [facility] Direct Number: [phone]  Fax: [phone]

## 2022-02-15 NOTE — Telephone Encounter (Signed)
Oral Oncology Patient Advocate Encounter  Reached out and spoke with patient regarding PAP paperwork, explained that I would send it to their preferred email via Docu[employer].   Confirmed email address: [email or web address] .    Patient expressed understanding and consent.  Will follow up once paperwork has been signed and returned.   [name], CPhT-Adv Oncology Pharmacy Patient [place] Direct Number: [phone]  Fax: [phone]

## 2022-02-19 ENCOUNTER — Telehealth: Payer: Self-pay

## 2022-02-19 MED ORDER — LISINOPRIL 5 MG PO TABS: 5.0000 mg | ORAL_TABLET | Freq: Every day | ORAL | 0 refills | Status: DC

## 2022-02-19 MED ORDER — SIMVASTATIN 20 MG PO TABS: 20.0000 mg | ORAL_TABLET | Freq: Every day | ORAL | 0 refills | Status: DC

## 2022-02-19 NOTE — Telephone Encounter (Signed)
[facility] pharmacy faxed over for change in pharmacy request and refill for Lisinopril 5 mg and Simvastatin 20 mg. Tried to contact patient to verify and no answer. Send in a 90 supply to [facility].

## 2022-02-25 ENCOUNTER — Other Ambulatory Visit: Payer: Self-pay | Admitting: *Deleted

## 2022-02-25 DIAGNOSIS — G479 Sleep disorder, unspecified: Secondary | ICD-10-CM

## 2022-02-25 MED ORDER — TEMAZEPAM 15 MG PO CAPS: ORAL_CAPSULE | ORAL | 5 refills | Status: DC

## 2022-02-25 NOTE — Telephone Encounter (Signed)
Pharmacy requested refill.  Pended Rx and sent to [facility] for approval.

## 2022-03-02 ENCOUNTER — Encounter: Payer: Self-pay | Admitting: Hematology

## 2022-03-10 ENCOUNTER — Other Ambulatory Visit (HOSPITAL_COMMUNITY): Payer: Self-pay

## 2022-03-10 ENCOUNTER — Telehealth: Payer: Self-pay | Admitting: Pharmacy Technician

## 2022-03-10 ENCOUNTER — Other Ambulatory Visit: Payer: Self-pay

## 2022-03-10 ENCOUNTER — Encounter: Payer: Self-pay | Admitting: Hematology

## 2022-03-10 DIAGNOSIS — C8580 Other specified types of non-Hodgkin lymphoma, unspecified site: Secondary | ICD-10-CM

## 2022-03-10 MED ORDER — IBRUTINIB 140 MG PO CAPS
420.0000 mg | ORAL_CAPSULE | Freq: Every day | ORAL | 2 refills | Status: DC
  Filled 2022-03-10 – 2022-04-12 (×2): qty 90, 30d supply, fill #0
  Filled 2022-05-07: qty 90, 30d supply, fill #1
  Filled 2022-06-04: qty 90, 30d supply, fill #2

## 2022-03-10 NOTE — Telephone Encounter (Signed)
Oral Oncology Patient Advocate Encounter  After completing a benefits investigation, prior authorization for Imbruvica is not required at this time through [facility].  Patient's copay is $3,327.15.     [name], CPhT-Adv Oncology Pharmacy Patient [place] Direct Number: [phone]  Fax: [phone]

## 2022-03-10 NOTE — Telephone Encounter (Signed)
Oral Oncology Patient Advocate Encounter   Was successful in securing patient a $10,000 grant from [place] to provide copayment coverage for Imbruvica.  This will keep the out of pocket expense at $0.     I have spoken with the patient.    The billing information is as follows and has been shared with [place].   Member ID: [id number] Group ID: [id number] RxBin: [id number] PCN: [id number] Dates of Eligibility: 03/10/22 through 03/11/23  Fund name:  [facility].   [name], CPhT-Adv Oncology Pharmacy Patient [place] Direct Number: [phone]  Fax: [phone]

## 2022-03-10 NOTE — Telephone Encounter (Signed)
Oral Oncology Patient Advocate Encounter  Patient must use available grant funding before re-enrolling in patient assistance. I have notified the patient.  I will re-open this case at a later time if need.  Sherry Mosley, CPhT-Adv Oncology Pharmacy Patient [place] Direct Number: [phone]  Fax: [phone]

## 2022-03-11 ENCOUNTER — Other Ambulatory Visit (HOSPITAL_COMMUNITY): Payer: Self-pay

## 2022-03-11 ENCOUNTER — Encounter: Payer: Self-pay | Admitting: Nurse Practitioner

## 2022-03-11 DIAGNOSIS — Z1231 Encounter for screening mammogram for malignant neoplasm of breast: Secondary | ICD-10-CM

## 2022-03-15 ENCOUNTER — Other Ambulatory Visit (HOSPITAL_COMMUNITY): Payer: Self-pay

## 2022-03-16 ENCOUNTER — Encounter: Payer: Self-pay | Admitting: Nurse Practitioner

## 2022-03-16 ENCOUNTER — Ambulatory Visit: Payer: Medicare PPO | Admitting: Nurse Practitioner

## 2022-03-16 ENCOUNTER — Telehealth: Payer: Self-pay

## 2022-03-16 DIAGNOSIS — Z0000 Encounter for general adult medical examination without abnormal findings: Secondary | ICD-10-CM

## 2022-03-16 DIAGNOSIS — E2839 Other primary ovarian failure: Secondary | ICD-10-CM

## 2022-03-16 NOTE — Telephone Encounter (Signed)
Ms. jannetta, massey are scheduled for a virtual visit with your provider today.    Just as we do with appointments in the office, we must obtain your consent to participate.  Your consent will be active for this visit and any virtual visit you may have with one of our providers in the next 365 days.    If you have a MyChart account, I can also send a copy of this consent to you electronically.  All virtual visits are billed to your insurance company just like a traditional visit in the office.  As this is a virtual visit, video technology does not allow for your provider to perform a traditional examination.  This may limit your provider's ability to fully assess your condition.  If your provider identifies any concerns that need to be evaluated in person or the need to arrange testing such as labs, EKG, etc, we will make arrangements to do so.    Although advances in technology are sophisticated, we cannot ensure that it will always work on either your end or our end.  If the connection with a video visit is poor, we may have to switch to a telephone visit.  With either a video or telephone visit, we are not always able to ensure that we have a secure connection.   I need to obtain your verbal consent now.   Are you willing to proceed with your visit today?   Maddeline Roorda has provided verbal consent on 03/16/2022 for a virtual visit (video or telephone).   Carroll Kinds, CMA 03/16/2022  10:10 AM

## 2022-03-16 NOTE — Progress Notes (Signed)
This service is provided via telemedicine  No vital signs collected/recorded due to the encounter was a telemedicine visit.   Location of patient (ex: home, work):  Home  Patient consents to a telephone visit:  Yes, see encounter dated 03/16/2022  Location of the provider (ex: office, home):  [place]  Name of any referring provider:  N/A  Names of all persons participating in the telemedicine service and their role in the encounter:  Sherrie Mustache, Nurse Practitioner, Carroll Kinds, CMA, and patient.   Time spent on call:  12 minutes with medical assistant

## 2022-03-16 NOTE — Progress Notes (Signed)
Subjective:   Sherry Mosley is a 77 y.o. female who presents for Medicare Annual (Subsequent) preventive examination.  Review of Systems     Cardiac Risk Factors include: advanced age (>65mn, >>3women);hypertension;dyslipidemia     Objective:    There were no vitals filed for this visit. There is no height or weight on file to calculate BMI.     03/16/2022   10:04 AM 09/22/2021   11:06 AM 05/26/2021   10:19 AM 04/21/2021   10:11 AM 03/10/2021   11:41 AM 02/04/2021    8:54 AM 12/05/2020    8:43 AM  Advanced Directives  Does Patient Have a Medical Advance Directive? Yes Yes No No No Yes No  Type of Advance Directive Out of facility DNR (pink MOST or yellow form) Sherry Mosley will    Out of facility DNR (pink MOST or yellow form)   Does patient want to make changes to medical advance directive? No - Patient declined No - Patient declined       Would patient like information on creating a medical advance directive?   No - Patient declined   No - Patient declined No - Patient declined    Current Medications (verified) Outpatient Encounter Medications as of 03/16/2022  Medication Sig   Ascorbic Acid (VITAMIN C) 1000 MG tablet Take 1 tablet by mouth as directed. Takes off and on , sporadically   Cholecalciferol (VITAMIN D3) 50 MCG (2000 UT) capsule Take 1 capsule by mouth as directed. Takes off and of, sporadically   ibrutinib (IMBRUVICA) 140 MG capsule Take 3 capsules (420 mg total) by mouth daily. Take with a glass of water.   lisinopril (ZESTRIL) 5 MG tablet Take 1 tablet (5 mg total) by mouth daily.   Loratadine 10 MG CAPS Take 1 tablet by mouth daily.   simvastatin (ZOCOR) 20 MG tablet Take 1 tablet (20 mg total) by mouth daily.   temazepam (RESTORIL) 15 MG capsule Take one capsule by mouth every night at bedtime as needed for sleep.   vitamin E 180 MG (400 UNITS) capsule Take 400 Units by mouth as directed. Takes off and on, sporadically   [DISCONTINUED]  sulfamethoxazole-trimethoprim (BACTRIM DS) 800-160 MG tablet Take 1 tablet by mouth 2 (two) times daily.   No facility-administered encounter medications on file as of 03/16/2022.    Allergies (verified) Cephalosporins and Penicillins   History: Past Medical History:  Diagnosis Date   Generalized osteoarthritis    H/O benign breast biopsy 1964   H/O bilateral cataract extraction 2005   Hyperlipidemia    Hypertension    [place]    [place] Hod[place]kin's [place]    Pancreatitis 10/20/2012   unknown etiology   Postmenopausal    S/P ankle arthrodesis 2012   Right   Sleep disorder    Uterine fibroid    Past Surgical History:  Procedure Laterality Date   ABDOMINAL HYSTERECTOMY  1997   ANKLE ARTHRODESIS Right 2012   BREAST BIOPSY Left 1969   fibroadenoma   CATARACT EXTRACTION W/ INTRAOCULAR LENS IMPLANT Bilateral    LYMPH NODE BIOPSY  2014   excisional biopsy right groin   Thumb surgery     removal of part of extensor tendon   Family History  Problem Relation Age of Onset   Alzheimer's disease Mother    Heart failure Father    Prostate cancer Father    Diabetes Maternal Grandfather    Breast cancer Neg Hx    Social  History   Socioeconomic History   Marital status: Married    Spouse name: Not on file   Number of children: 2   Years of education: Not on file   Highest education level: Not on file  Occupational History   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 Son is alternate   Would accept resuscitation    No tube feeds if cognitively unaware   Social Determinants of Health   Financial Resource Strain: Not on file  Food Insecurity: Not on file  Transportation Needs: Not on file  Physical Activity: Not on file  Stress: Not on file  Social Connections: Not on file    Tobacco Counseling Counseling given: Not Answered   Clinical Intake:  Pre-visit preparation completed: Yes  Pain : No/denies pain     BMI - recorded: 25 Nutritional Status: BMI 25 -29 Overweight Diabetes: No  How often do you need to have someone help you when you read instructions, pamphlets, or other written materials from your doctor or pharmacy?: 1 - Never  Diabetic?no         Activities of Daily Living    03/16/2022   10:16 AM 05/26/2021    1:00 PM  In your present state of health, do you have any difficulty performing the following activities:  Hearing? 0 0  Vision? 0 0  Difficulty concentrating or making decisions? 1 0  Walking or climbing stairs? 0 0  Dressing or bathing? 0 0  Doing errands, shopping? 0 0  Preparing Food and eating ? N   Using the Toilet? N   In the past six months, have you accidently leaked urine? N   Do you have problems with loss of bowel control? N   Managing your Medications? N   Managing your Finances? N   Housekeeping or managing your Housekeeping? N     Patient Care Team: [name], NP as PCP - General (Geriatric Medicine)  Indicate any recent Medical Services you may have received from other than [facility] providers in the past year (date may be approximate).     Assessment:   This is a routine wellness examination for Sherry Mosley.  Hearing/Vision screen Hearing Screening - Comments:: Patient has no hearing problems Vision Screening - Comments:: Patient wears glasses. Patient had yearly eye exam. Patient can't remember name of doctor.  Dietary issues and exercise activities discussed: Current Exercise Habits: Home exercise routine, Time (Minutes): 30,  Frequency (Times/Week): 2, Weekly Exercise (Minutes/Week): 60   Goals Addressed   None    Depression Screen    03/16/2022   10:01 AM 09/22/2021   11:04 AM 03/10/2021   11:37 AM 02/05/2019   12:09 PM  PHQ 2/9 Scores  PHQ - 2 Score 0 0 0 0    Fall Risk    03/16/2022   10:03 AM 09/22/2021   11:04 AM 04/21/2021   10:10 AM 03/10/2021   11:39 AM 10/14/2020  10:43 AM  Fall Risk   Falls in the past year? 0 0 0 0 0  Number falls in past yr: 0 0 0 0 0  Injury with Fall? 0 0 0 0 0  Risk for fall due to : No Fall Risks No Fall Risks No Fall Risks No Fall Risks No Fall Risks  Follow up Falls evaluation completed Falls evaluation completed Falls evaluation completed Falls evaluation completed Falls evaluation completed    [place]:  Any stairs in or around the home? No  If so, are there any without handrails? No  Home free of loose throw rugs in walkways, pet beds, electrical cords, etc? Yes  Adequate lighting in your home to reduce risk of falls? Yes   ASSISTIVE DEVICES UTILIZED TO PREVENT FALLS:  Life alert? No  Use of a cane, walker or w/c? No  Grab bars in the bathroom? Yes  Shower chair or bench in shower? Yes  Elevated toilet seat or a handicapped toilet? Yes   TIMED UP AND GO:  Was the test performed? No .    Cognitive Function:        03/16/2022   10:05 AM 03/10/2021   11:41 AM  6CIT Screen  What Year? 0 points 0 points  What month? 0 points 0 points  What time? 0 points 0 points  Count back from 20 0 points 0 points  Months in reverse 0 points 0 points  Repeat phrase 0 points 0 points  Total Score 0 points 0 points    Immunizations Immunization History  Administered Date(s) Administered   Influenza-Unspecified 10/31/2018, 12/04/2019, 12/11/2020, 11/25/2021   Moderna Covid-19 Vaccine Bivalent Booster 37yr & up 12/30/2020   Moderna Sars-Covid-2 Vaccination 03/13/2019, 04/10/2019, 01/15/2020, 07/17/2020   Pneumococcal  Conjugate-13 12/24/2014   Pneumococcal Polysaccharide-23 07/11/2012   Tdap 12/31/2006, 02/13/2020   Zoster, Live 01/30/2019   Zoster, Unspecified 01/30/2007    TDAP status: Up to date  Flu Vaccine status: Up to date  Pneumococcal vaccine status: Up to date  Covid-19 vaccine status: Information provided on how to obtain vaccines.   Qualifies for Shingles Vaccine? Yes   Zostavax completed Yes   Shingrix Completed?: Yes  Screening Tests Health Maintenance  Topic Date Due   Zoster Vaccines- Shingrix (1 of 2) 08/07/1962   DEXA SCAN  Never done   COVID-19 Vaccine (6 - 2023-24 season) 10/30/2021   Medicare Annual Wellness (AWV)  03/17/2023   DTaP/Tdap/Td (3 - Td or Tdap) 02/12/2030   Pneumonia Vaccine 77 Years old  Completed   INFLUENZA VACCINE  Completed   Hepatitis C Screening  Completed   HPV VACCINES  Aged Out    Health Maintenance  Health Maintenance Due  Topic Date Due   Zoster Vaccines- Shingrix (1 of 2) 08/07/1962   DEXA SCAN  Never done   COVID-19 Vaccine (6 - 2023-24 season) 10/30/2021    Colorectal cancer screening: No longer required.   Mammogram status: No longer required due to age.  Bone Density status: Ordered today. Pt provided with contact info and advised to call to schedule appt.  Lung Cancer Screening: (Low Dose CT Chest recommended if Age 79-80years, 30 pack-year currently smoking OR have quit w/in 15years.) does not qualify.   Lung Cancer Screening Referral: na  Additional Screening:  Hepatitis C Screening: does qualify; Completed 2022  Vision Screening: Recommended annual ophthalmology exams for early detection of glaucoma and other disorders of the eye. Is the patient up  to date with their annual eye exam?  Yes  Who is the provider or what is the name of the office in which the patient attends annual eye exams? [facility] [place] [facility] If pt is not established with a provider, would they like to be referred to a provider to establish  care? No .   Dental Screening: Recommended annual dental exams for proper oral hygiene  Community Resource Referral / Chronic Care Management: CRR required this visit?  No   CCM required this visit?  No      Plan:     I have personally reviewed and noted the following in the patient's chart:   Medical and social history Use of alcohol, tobacco or illicit drugs  Current medications and supplements including opioid prescriptions. Patient is not currently taking opioid prescriptions. Functional ability and status Nutritional status Physical activity Advanced directives List of other physicians Hospitalizations, surgeries, and ER visits in previous 12 months Vitals Screenings to include cognitive, depression, and falls Referrals and appointments  In addition, I have reviewed and discussed with patient certain preventive protocols, quality metrics, and best practice recommendations. A written personalized care plan for preventive services as well as general preventive health recommendations were provided to patient.     [name], NP   03/16/2022      Virtual Visit via video  I connected with patient on 03/16/22 at 10:40 AM EST by video and verified that I am speaking with the correct person using two identifiers.  Location: Patient: home Provider: [facility]   I discussed the limitations, risks, security and privacy concerns of performing an evaluation and management service by telephone and the availability of in person appointments. I also discussed with the patient that there may be a patient responsible charge related to this service. The patient expressed understanding and agreed to proceed.   I discussed the assessment and treatment plan with the patient. The patient was provided an opportunity to ask questions and all were answered. The patient agreed with the plan and demonstrated an understanding of the instructions.   The patient was advised to call  back or seek an in-person evaluation if the symptoms worsen or if the condition fails to improve as anticipated.  I provided 15 minutes of non-face-to-face time during this encounter.  [employer]. Harle Battiest Avs printed and mailed

## 2022-03-16 NOTE — Patient Instructions (Signed)
Sherry Mosley , Thank you for taking time to come for your Medicare Wellness Visit. I appreciate your ongoing commitment to your health goals. Please review the following plan we discussed and let me know if I can assist you in the future.   Screening recommendations/referrals: Colonoscopy aged out Mammogram aged out  Bone Density ordered today Recommended yearly ophthalmology/optometry visit for glaucoma screening and checkup Recommended yearly dental visit for hygiene and checkup  Vaccinations: Influenza vaccine- due annually in September/October Pneumococcal vaccine up to date Tdap vaccine up to date Shingles vaccine DUE- recommend to get at your local pharmacy    Advanced directives: recommended to complete and bring so we can place on chart  Conditions/risks identified: advanced age (77).   Next appointment: yearly    Preventive Care 79 Years and Older, Female Preventive care refers to lifestyle choices and visits with your health care provider that can promote health and wellness. What does preventive care include? A yearly physical exam. This is also called an annual well check. Dental exams once or twice a year. Routine eye exams. Ask your health care provider how often you should have your eyes checked. Personal lifestyle choices, including: Daily care of your teeth and gums. Regular physical activity. Eating a healthy diet. Avoiding tobacco and drug use. Limiting alcohol use. Practicing safe sex. Taking low-dose aspirin every day. Taking vitamin and mineral supplements as recommended by your health care provider. What happens during an annual well check? The services and screenings done by your health care provider during your annual well check will depend on your age, overall health, lifestyle risk factors, and family history of disease. Counseling  Your health care provider may ask you questions about your: Alcohol use. Tobacco use. Drug use. Emotional  well-being. Home and relationship well-being. Sexual activity. Eating habits. History of falls. Memory and ability to understand (cognition). Work and work Statistician. Reproductive health. Screening  You may have the following tests or measurements: Height, weight, and BMI. Blood pressure. Lipid and cholesterol levels. These may be checked every 5 years, or more frequently if you are over 17 years old. Skin check. Lung cancer screening. You may have this screening every year starting at age 79 if you have a 30-pack-year history of smoking and currently smoke or have quit within the past 15 years. Fecal occult blood test (FOBT) of the stool. You may have this test every year starting at age 79. Flexible sigmoidoscopy or colonoscopy. You may have a sigmoidoscopy every 5 years or a colonoscopy every 10 years starting at age 79. Hepatitis C blood test. Hepatitis B blood test. Sexually transmitted disease (STD) testing. Diabetes screening. This is done by checking your blood sugar (glucose) after you have not eaten for a while (fasting). You may have this done every 1-3 years. Bone density scan. This is done to screen for osteoporosis. You may have this done starting at age 79. Mammogram. This may be done every 1-2 years. Talk to your health care provider about how often you should have regular mammograms. Talk with your health care provider about your test results, treatment options, and if necessary, the need for more tests. Vaccines  Your health care provider may recommend certain vaccines, such as: Influenza vaccine. This is recommended every year. Tetanus, diphtheria, and acellular pertussis (Tdap, Td) vaccine. You may need a Td booster every 10 years. Zoster vaccine. You may need this after age 79. Pneumococcal 13-valent conjugate (PCV13) vaccine. One dose is recommended after age 79. Pneumococcal polysaccharide (PPSV23)  vaccine. One dose is recommended after age 79. Talk to your  health care provider about which screenings and vaccines you need and how often you need them. This information is not intended to replace advice given to you by your health care provider. Make sure you discuss any questions you have with your health care provider. Document Released: 03/14/2015 Document Revised: 11/05/2015 Document Reviewed: 12/17/2014 Elsevier Interactive Patient Education  2017 [place] Prevention in the Home Falls can cause injuries. They can happen to people of all ages. There are many things you can do to make your home safe and to help prevent falls. What can I do on the outside of my home? Regularly fix the edges of walkways and driveways and fix any cracks. Remove anything that might make you trip as you walk through a door, such as a raised step or threshold. Trim any bushes or trees on the path to your home. Use bright outdoor lighting. Clear any walking paths of anything that might make someone trip, such as rocks or tools. Regularly check to see if handrails are loose or broken. Make sure that both sides of any steps have handrails. Any raised decks and porches should have guardrails on the edges. Have any leaves, snow, or ice cleared regularly. Use sand or salt on walking paths during winter. Clean up any spills in your garage right away. This includes oil or grease spills. What can I do in the bathroom? Use night lights. Install grab bars by the toilet and in the tub and shower. Do not use towel bars as grab bars. Use non-skid mats or decals in the tub or shower. If you need to sit down in the shower, use a plastic, non-slip stool. Keep the floor dry. Clean up any water that spills on the floor as soon as it happens. Remove soap buildup in the tub or shower regularly. Attach bath mats securely with double-sided non-slip rug tape. Do not have throw rugs and other things on the floor that can make you trip. What can I do in the bedroom? Use night  lights. Make sure that you have a light by your bed that is easy to reach. Do not use any sheets or blankets that are too big for your bed. They should not hang down onto the floor. Have a firm chair that has side arms. You can use this for support while you get dressed. Do not have throw rugs and other things on the floor that can make you trip. What can I do in the kitchen? Clean up any spills right away. Avoid walking on wet floors. Keep items that you use a lot in easy-to-reach places. If you need to reach something above you, use a strong step stool that has a grab bar. Keep electrical cords out of the way. Do not use floor polish or wax that makes floors slippery. If you must use wax, use non-skid floor wax. Do not have throw rugs and other things on the floor that can make you trip. What can I do with my stairs? Do not leave any items on the stairs. Make sure that there are handrails on both sides of the stairs and use them. Fix handrails that are broken or loose. Make sure that handrails are as long as the stairways. Check any carpeting to make sure that it is firmly attached to the stairs. Fix any carpet that is loose or worn. Avoid having throw rugs at the top or bottom  of the stairs. If you do have throw rugs, attach them to the floor with carpet tape. Make sure that you have a light switch at the top of the stairs and the bottom of the stairs. If you do not have them, ask someone to add them for you. What else can I do to help prevent falls? Wear shoes that: Do not have high heels. Have rubber bottoms. Are comfortable and fit you well. Are closed at the toe. Do not wear sandals. If you use a stepladder: Make sure that it is fully opened. Do not climb a closed stepladder. Make sure that both sides of the stepladder are locked into place. Ask someone to hold it for you, if possible. Clearly mark and make sure that you can see: Any grab bars or handrails. First and last  steps. Where the edge of each step is. Use tools that help you move around (mobility aids) if they are needed. These include: Canes. Walkers. Scooters. Crutches. Turn on the lights when you go into a dark area. Replace any light bulbs as soon as they burn out. Set up your furniture so you have a clear path. Avoid moving your furniture around. If any of your floors are uneven, fix them. If there are any pets around you, be aware of where they are. Review your medicines with your doctor. Some medicines can make you feel dizzy. This can increase your chance of falling. Ask your doctor what other things that you can do to help prevent falls. This information is not intended to replace advice given to you by your health care provider. Make sure you discuss any questions you have with your health care provider. Document Released: 12/12/2008 Document Revised: 07/24/2015 Document Reviewed: 03/22/2014 Elsevier Interactive Patient Education  2017 [employer].

## 2022-03-17 ENCOUNTER — Other Ambulatory Visit: Payer: Self-pay

## 2022-03-19 ENCOUNTER — Other Ambulatory Visit: Payer: Self-pay | Admitting: Nurse Practitioner

## 2022-03-19 DIAGNOSIS — Z1231 Encounter for screening mammogram for malignant neoplasm of breast: Secondary | ICD-10-CM

## 2022-03-22 ENCOUNTER — Other Ambulatory Visit (HOSPITAL_COMMUNITY): Payer: Self-pay

## 2022-03-24 ENCOUNTER — Other Ambulatory Visit (HOSPITAL_COMMUNITY): Payer: Self-pay

## 2022-04-05 ENCOUNTER — Encounter: Payer: Self-pay | Admitting: Student

## 2022-04-05 ENCOUNTER — Ambulatory Visit: Payer: Medicare PPO | Admitting: Student

## 2022-04-05 VITALS — BP 118/64 | HR 90 | Temp 97.8°F | Ht 63.0 in | Wt 149.0 lb

## 2022-04-05 DIAGNOSIS — M25432 Effusion, left wrist: Secondary | ICD-10-CM

## 2022-04-05 NOTE — Progress Notes (Signed)
[facility]  Provider: Unk Lightning  Code Status: DNR Goals of Care:     04/05/2022    1:24 PM  Advanced Directives  Does Patient Have a Medical Advance Directive? Yes  Type of Advance Directive Out of facility DNR (pink MOST or yellow form);Living will  Does patient want to make changes to medical advance directive? No - Patient declined     Chief Complaint  Patient presents with   Acute Visit    [place] a couple days ago and hurt left arm/hand.     HPI: Patient is a 77 y.o. female seen today for an acute visit for examination after a fall. She was at her daughter's in [place].  She slipped on some wet leaves recently. She has significant bruising that developed over the last 2 days  She has little pain 2/10. She has taken some tylenol as well.   Notable deformity of the medal wrist.   Past Medical History:  Diagnosis Date   Generalized osteoarthritis    H/O benign breast biopsy 1964   H/O bilateral cataract extraction 2005   Hyperlipidemia    Hypertension    Mood disorder ([place])    Non Hodgkin's lymphoma ([place])    Pancreatitis 10/20/2012   unknown etiology   Postmenopausal    S/P ankle arthrodesis 2012   Right   Sleep disorder    Uterine fibroid     Past Surgical History:  Procedure Laterality Date   ABDOMINAL HYSTERECTOMY  1997   ANKLE ARTHRODESIS Right 2012   BREAST BIOPSY Left 1969   fibroadenoma   CATARACT EXTRACTION W/ INTRAOCULAR LENS IMPLANT Bilateral    LYMPH NODE BIOPSY  2014   excisional biopsy right groin   Thumb surgery     removal of part of extensor tendon    Allergies  Allergen Reactions   Cephalosporins     Rash   Penicillins     Significant Rash    Outpatient Encounter Medications as of 04/05/2022  Medication Sig   Ascorbic Acid (VITAMIN C) 1000 MG tablet Take 1 tablet by mouth as directed. Takes off and on , sporadically   Cholecalciferol (VITAMIN D3) 50 MCG (2000 UT) capsule Take 1 capsule by mouth as directed. Takes off and of,  sporadically   ibrutinib (IMBRUVICA) 140 MG capsule Take 3 capsules (420 mg total) by mouth daily. Take with a glass of water.   lisinopril (ZESTRIL) 5 MG tablet Take 1 tablet (5 mg total) by mouth daily.   Loratadine 10 MG CAPS Take 1 tablet by mouth daily.   simvastatin (ZOCOR) 20 MG tablet Take 1 tablet (20 mg total) by mouth daily.   temazepam (RESTORIL) 15 MG capsule Take one capsule by mouth every night at bedtime as needed for sleep.   vitamin E 180 MG (400 UNITS) capsule Take 400 Units by mouth as directed. Takes off and on, sporadically   No facility-administered encounter medications on file as of 04/05/2022.    Review of Systems:  Review of Systems  Health Maintenance  Topic Date Due   DEXA SCAN  Never done   COVID-19 Vaccine (7 - 2023-24 season) 01/24/2022   Zoster Vaccines- Shingrix (2 of 2) 01/24/2022   Medicare Annual Wellness (AWV)  03/17/2023   DTaP/Tdap/Td (3 - Td or Tdap) 02/12/2030   Pneumonia Vaccine 8+ Years old  Completed   INFLUENZA VACCINE  Completed   Hepatitis C Screening  Completed   HPV VACCINES  Aged Out    Physical Exam:  Vitals:   04/05/22 1321  BP: 118/64  Pulse: 90  Temp: 97.8 F (36.6 C)  SpO2: 98%  Weight: 149 lb (67.6 kg)  Height: '5\' 3"'$  (1.6 m)   Body mass index is 26.39 kg/m. Physical Exam Constitutional:      Appearance: Normal appearance.  Skin:    General: Skin is warm.     Comments: Bruising of hand and wrist. Swelling in medial wrist  Neurological:     General: No focal deficit present.     Mental Status: She is alert and oriented to person, place, and time. Mental status is at baseline.     Labs reviewed: Basic Metabolic Panel: Recent Labs    08/21/21 1030 10/21/21 1133 12/11/21 1057 01/18/22 1045  NA 140 139  --  141  K 3.9 4.0  --  4.3  CL 105 105  --  105  CO2 31 30  --  33*  GLUCOSE 87 76  --  74  BUN 22 19  --  19  CREATININE 0.93 0.73 1.10* 0.88  CALCIUM 9.3 9.6  --  9.8   Liver Function  Tests: Recent Labs    08/21/21 1030 10/21/21 1133 01/18/22 1045  AST 14* 16 13*  ALT '11 18 13  '$ ALKPHOS 42 52 49  BILITOT 0.8 1.0 0.9  PROT 7.1 7.4 7.5  ALBUMIN 3.7 4.1 4.0   No results for input(s): "LIPASE", "AMYLASE" in the last 8760 hours. No results for input(s): "AMMONIA" in the last 8760 hours. CBC: Recent Labs    08/21/21 1030 10/21/21 1133 01/18/22 1045  WBC 4.1 6.5 4.5  NEUTROABS 2.1 2.5 2.1  HGB 14.2 15.3* 15.0  HCT 43.2 44.7 45.3  MCV 92.3 90.1 92.6  PLT 86* 103* 99*   Lipid Panel: No results for input(s): "CHOL", "HDL", "LDLCALC", "TRIG", "CHOLHDL", "LDLDIRECT" in the last 8760 hours. No results found for: "HGBA1C"  Procedures since last visit: No results found.  Assessment/Plan Wrist swelling, left Patient with left wrist swelling and bruising after a fall. Radial and Ulnar pulses palpable. Plan to send to [facility] for orthopedic evaluation.   Labs/tests ordered:  * No order type specified * Next appt:  09/23/2022

## 2022-04-05 NOTE — Patient Instructions (Addendum)
3:15 appointment for evaluation of the wrist  [facility]  [address], [place] [address]

## 2022-04-06 ENCOUNTER — Encounter: Payer: Self-pay | Admitting: Hematology

## 2022-04-09 ENCOUNTER — Other Ambulatory Visit: Payer: Self-pay | Admitting: *Deleted

## 2022-04-09 DIAGNOSIS — C8580 Other specified types of non-Hodgkin lymphoma, unspecified site: Secondary | ICD-10-CM

## 2022-04-09 DIAGNOSIS — D696 Thrombocytopenia, unspecified: Secondary | ICD-10-CM

## 2022-04-12 ENCOUNTER — Other Ambulatory Visit: Payer: Self-pay

## 2022-04-12 ENCOUNTER — Other Ambulatory Visit (HOSPITAL_COMMUNITY): Payer: Self-pay

## 2022-04-12 ENCOUNTER — Inpatient Hospital Stay: Payer: Medicare PPO | Attending: Hematology

## 2022-04-12 DIAGNOSIS — C8300 Small cell B-cell lymphoma, unspecified site: Secondary | ICD-10-CM | POA: Insufficient documentation

## 2022-04-12 DIAGNOSIS — C8580 Other specified types of non-Hodgkin lymphoma, unspecified site: Secondary | ICD-10-CM

## 2022-04-12 DIAGNOSIS — D696 Thrombocytopenia, unspecified: Secondary | ICD-10-CM

## 2022-04-12 LAB — CBC WITH DIFFERENTIAL/PLATELET
Abs Immature Granulocytes: 0.02 10*3/uL (ref 0.00–0.07)
Basophils Absolute: 0 10*3/uL (ref 0.0–0.1)
Basophils Relative: 0 %
Eosinophils Absolute: 0 10*3/uL (ref 0.0–0.5)
Eosinophils Relative: 1 %
HCT: 42.2 % (ref 36.0–46.0)
Hemoglobin: 14.2 g/dL (ref 12.0–15.0)
Immature Granulocytes: 0 %
Lymphocytes Relative: 27 %
Lymphs Abs: 1.3 10*3/uL (ref 0.7–4.0)
MCH: 31.2 pg (ref 26.0–34.0)
MCHC: 33.6 g/dL (ref 30.0–36.0)
MCV: 92.7 fL (ref 80.0–100.0)
Monocytes Absolute: 0.5 10*3/uL (ref 0.1–1.0)
Monocytes Relative: 10 %
Neutro Abs: 2.9 10*3/uL (ref 1.7–7.7)
Neutrophils Relative %: 62 %
Platelets: 101 10*3/uL — ABNORMAL LOW (ref 150–400)
RBC: 4.55 MIL/uL (ref 3.87–5.11)
RDW: 13 % (ref 11.5–15.5)
WBC: 4.7 10*3/uL (ref 4.0–10.5)
nRBC: 0 % (ref 0.0–0.2)

## 2022-04-12 LAB — COMPREHENSIVE METABOLIC PANEL
ALT: 67 U/L — ABNORMAL HIGH (ref 0–44)
AST: 51 U/L — ABNORMAL HIGH (ref 15–41)
Albumin: 3.6 g/dL (ref 3.5–5.0)
Alkaline Phosphatase: 85 U/L (ref 38–126)
Anion gap: 11 (ref 5–15)
BUN: 18 mg/dL (ref 8–23)
CO2: 26 mmol/L (ref 22–32)
Calcium: 8.6 mg/dL — ABNORMAL LOW (ref 8.9–10.3)
Chloride: 100 mmol/L (ref 98–111)
Creatinine, Ser: 0.87 mg/dL (ref 0.44–1.00)
GFR, Estimated: >60 mL/min (ref >60)
Glucose, Bld: 88 mg/dL (ref 70–99)
Potassium: 4 mmol/L (ref 3.5–5.1)
Sodium: 137 mmol/L (ref 135–145)
Total Bilirubin: 0.6 mg/dL (ref 0.3–1.2)
Total Protein: 7.9 g/dL (ref 6.5–8.1)

## 2022-04-12 LAB — LACTATE DEHYDROGENASE: LDH: 419 U/L — ABNORMAL HIGH (ref 98–192)

## 2022-04-14 ENCOUNTER — Inpatient Hospital Stay (HOSPITAL_BASED_OUTPATIENT_CLINIC_OR_DEPARTMENT_OTHER): Payer: Medicare PPO | Admitting: Hematology

## 2022-04-14 DIAGNOSIS — D696 Thrombocytopenia, unspecified: Secondary | ICD-10-CM | POA: Diagnosis not present

## 2022-04-14 DIAGNOSIS — C8300 Small cell B-cell lymphoma, unspecified site: Secondary | ICD-10-CM | POA: Diagnosis not present

## 2022-04-14 DIAGNOSIS — C8580 Other specified types of non-Hodgkin lymphoma, unspecified site: Secondary | ICD-10-CM | POA: Diagnosis not present

## 2022-04-14 NOTE — Progress Notes (Deleted)
Fall-- on wet leaves. Left arm bruise. No hematoma. Left wrist mass after fall on extensor surface. X ray arm -- no # Off Ibrutinib 2 weeks. Lfts elevated .

## 2022-04-14 NOTE — Progress Notes (Signed)
HEMATOLOGY/ONCOLOGY CLINIC NOTE  Date of Service: 04/14/22  Patient Care Team: [name], NP as PCP - General (Geriatric Medicine)  CHIEF COMPLAINTS/PURPOSE OF CONSULTATION:  Follow-up for continued evaluation and management of relapsed marginal zone lymphoma  ONCOLOGIC Hx:  B-cell lymphoproliferative disorder, NOS, diagnosed originally by fine needle aspiration of right groin lymph node on 10/25/12  S/p excisional biopsy of right inguinal lymph node on 12/08/12 with finding of B cell lymphoproliferative disorder with indistinct phenotype.  PET and CT scans performed on 12/21/12 consistent with clinical stage disease with adenopathy in both inguinal and femoral areas and left posterior triangle in the neck and probably the left axilla.  Completion of five cycles of CVP-R from 05/05/18 through 08/18/18.   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 female is here with her husband for continued evaluation and management of her marginal zone lymphoma. .I connected with Aubreana Golde on 04/14/2022 at  8:40 AM EST by telephone visit and verified that I am speaking with the correct person using two identifiers.   Patient was last seen by me on 01/18/2022 and she was doing well overall.  Patient reported that she recently fell on wet leaves and bruised her left arm/elbow. She is recovering from the fall. She reports of new small nodule near her left wrist after her fall on extensor surface, which she is getting it checked by her PCP. She denies hematoma.   Patient notes she has not been taking her Ibrutinib for 2 weeks. She reports she only takes tylenol as needed.    We discussed lab results from 04/12/2022 with the patient. CMP shows elevated AST and ALT and  recommended to hold Simvastatin and alcohol intake.   I discussed the limitations, risks, security and privacy concerns of performing an evaluation and management service by telemedicine and the availability of in-person appointments. I also discussed with the patient that there may be a patient responsible charge related to this service. The patient expressed understanding and agreed to proceed.   Other persons participating in the visit and their role in the encounter: None   Patient's location: Home  Provider's location: [facility]   Chief Complaint: marginal zone lymphoma   MEDICAL HISTORY:   Pancreatitis (10/20/12), unknown etiology  Postmenopausal Uterine fibroids Benign breast biopsy (1964) Right ankle arthrodesis (2012) Bilateral cataract extractions (2001, 2005) Hypercholesterolemia Essential hypertension  SURGICAL HISTORY: Inguinal LN biopsy TAH/BSO (1997)  SOCIAL HISTORY: Social History   Socioeconomic History   Marital status: Married    Spouse name: Not on file   Number of children: 2   Years of education: Not on file   Highest education level: Not on file  Occupational History   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 Son is alternate   Would accept resuscitation   No tube feeds if cognitively unaware   Social Determinants of Health   Financial Resource Strain: Not on file  Food Insecurity: Not on file  Transportation Needs: Not on file  Physical Activity: Not on file  Stress: Not on file  Social  Connections: Not on file  Intimate Partner Violence: Not on file    FAMILY HISTORY: Family History  Problem Relation Age of Onset   Alzheimer's disease Mother    Heart failure Father    Prostate cancer Father    Diabetes Maternal Grandfather    Breast cancer Neg Hx     ALLERGIES:  is allergic to cephalosporins and penicillins.  PCN - significant rash  Cephalosporins - significant rash   MEDICATIONS:  Current Outpatient Medications  Medication Sig Dispense Refill   Ascorbic Acid (VITAMIN C) 1000 MG tablet Take 1 tablet by mouth as directed. Takes off and on , sporadically     Cholecalciferol (VITAMIN D3) 50 MCG (2000 UT) capsule Take 1 capsule by mouth as directed. Takes off and of, sporadically     ibrutinib (IMBRUVICA) 140 MG capsule Take 3 capsules (420 mg total) by mouth daily. Take with a glass of water. 90 capsule 2   lisinopril (ZESTRIL) 5 MG tablet Take 1 tablet (5 mg total) by mouth daily. 90 tablet 0   Loratadine 10 MG CAPS Take 1 tablet by mouth daily.     simvastatin (ZOCOR) 20 MG tablet Take 1 tablet (20 mg total) by mouth daily. 90 tablet 0   temazepam (RESTORIL) 15 MG capsule Take one capsule by mouth every night at bedtime as needed for sleep. 30 capsule 5   vitamin E 180 MG (400 UNITS) capsule Take 400 Units by mouth as directed. Takes off and on, sporadically     No current facility-administered medications for this visit.  Lisinopril 68m -  HTN  Simvastatin 264m- HLD  REVIEW OF SYSTEMS:  . 10 Point review of Systems was done is negative except as noted above.  PHYSICAL EXAMINATION: Telemedicine visit.  LABORATORY DATA:  I have reviewed the data as listed  .    Latest Ref Rng & Units 04/12/2022    1:54 PM 01/18/2022   10:45 AM 10/21/2021   11:33 AM  CBC  WBC 4.0 - 10.5 K/uL 4.7  4.5  6.5   Hemoglobin 12.0 - 15.0 g/dL 14.2  15.0  15.3   Hematocrit 36.0 - 46.0 % 42.2  45.3  44.7   Platelets 150 - 400 K/uL 101  99  103    .CBC    Component Value  Date/Time   WBC 4.7 04/12/2022 1354   RBC 4.55 04/12/2022 1354   HGB 14.2 04/12/2022 1354   HGB 15.0 01/18/2022 1045   HCT 42.2 04/12/2022 1354   PLT 101 (L) 04/12/2022 1354   PLT 99 (L) 01/18/2022 1045   MCV 92.7 04/12/2022 1354   MCH 31.2 04/12/2022 1354   MCHC 33.6 04/12/2022 1354   RDW 13.0 04/12/2022 1354   LYMPHSABS 1.3 04/12/2022 1354   MONOABS 0.5 04/12/2022 1354   EOSABS 0.0 04/12/2022 1354   BASOSABS 0.0 04/12/2022 1354        Latest Ref Rng & Units 04/12/2022    1:54 PM 01/18/2022   10:45 AM 12/11/2021   10:57 AM  CMP  Glucose 70 - 99 mg/dL 88  74    BUN 8 -  23 mg/dL 18  19    Creatinine 0.44 - 1.00 mg/dL 0.87  0.88  1.10   Sodium 135 - 145 mmol/L 137  141    Potassium 3.5 - 5.1 mmol/L 4.0  4.3    Chloride 98 - 111 mmol/L 100  105    CO2 22 - 32 mmol/L 26  33    Calcium 8.9 - 10.3 mg/dL 8.6  9.8    Total Protein 6.5 - 8.1 g/dL 7.9  7.5    Total Bilirubin 0.3 - 1.2 mg/dL 0.6  0.9    Alkaline Phos 38 - 126 U/L 85  49    AST 15 - 41 U/L 51  13    ALT 0 - 44 U/L 67  13     . Lab Results  Component Value Date   LDH 419 (H) 04/12/2022    SURGICAL PATHOLOGY  CASE: [id number]  PATIENT: Kathlynn Marchitto  Surgical Pathology Report   Specimen Submitted:  A. Lymph node, right inguinal   Clinical History: History of low grade B cell lymphoma, now with right  inguinal lymphadenopathy, post US guided BX   DIAGNOSIS:  A. LYMPH NODE, RIGHT INGUINAL; ULTRASOUND-GUIDED BIOPSY:  - LOW-GRADE B-CELL LYMPHOMA WITH FEATURES MOST SUGGESTIVE OF MARGINAL  ZONE LYMPHOMA.   Comment:  Sections demonstrate small to medium sized lymphocytes with monocytoid  features.  Scattered plasma cells are present. Residual normal lymphoid  architecture is not identified.  Material was submitted for flow cytometric analysis which revealed a  monoclonal kappa B cell population that was negative for CD5, CD23, and  CD10 (see scanned report in [facility]).  A panel of immunohistochemical  stains was performed with the following  pattern of immunoreactivity:  CD20: Positive in neoplastic lymphocytes  CD3: Highlights background T cells in a distribution similar to CD5  CD5: Highlights background T cells in a distribution similar to CD3  CD23: Highlights focal residual dendritic meshworks  CD10: Negative in neoplastic lymphocytes  BCL-2: Positive in neoplastic lymphocytes  BCL 6: Negative in neoplastic lymphocytes  Cyclin D1: Negative in neoplastic lymphocytes  Sox 11: Negative in neoplastic lymphocytes  Kappa-ISH: Positive in neoplastic lymphocytes  Lambda-ISH: Negative in neoplastic lymphocytes  Ki-67: Approximately 30%  The histologic features in conjunction with the immunophenotypic  findings support the diagnosis of a low-grade mature B-cell lymphoma  most suggestive of marginal zone lymphoma.   Sox 11 IHC and kappa / lambda ISH slides were prepared by [place] for [employer], [place], [place].  The remaining IHC slides were prepared by [facility] for Molecular  Biology and Pathology, [place], [place]. All controls stained appropriately.   This test was developed and its performance characteristics determined  by [facility]. It has not been cleared or approved by the [place] Food and Drug  Administration. The FDA does not require this test to go through  premarket FDA review. This test is used for clinical purposes. It should  not be regarded as investigational or for research. This laboratory is  certified under the Clinical Laboratory Improvement Amendments (CLIA) as  qualified to perform high complexity clinical laboratory testing.   GROSS DESCRIPTION:  A. Labeled: Right inguinal lymph node  Received: The specimen is received fresh on saline soaked gauze.  Tissue fragment(s): Multiple  Size: Range from 0.5 to 1.9 cm in length and 0.1 cm in diameter  Description: Received are multiple cores and fragments of tan-white soft  tissue.  1 touch  prep with Diff-Quik stain is performed.  Representative  sections are submitted for flow cytometry in RPMI.  The remainder of the  specimen is submitted in cassettes 1-4 with 1 core per cassette.    Final Diagnosis performed by Quay Burow, MD.   Electronically signed  08/31/2019 1:38:09PM  The electronic signature indicates that the named Attending Pathologist  has evaluated the specimen  Technical component performed at [facility], [address], [place],  [place] [address] Lab: [id number] Dir: Rush Farmer, MD, MMM   Professional component performed at [facility], [facility], [place], [place], [place] [address] Lab: [phone]  Dir: Dellia Nims. Reuel Derby, MD   RADIOGRAPHIC STUDIES: I have personally reviewed the radiological images as listed and agreed with the findings in the report. No results found.  ASSESSMENT & PLAN:   77 y.o. with   1) Atleast Stage IIIA Low grade B cell Non Hodgkins Lymphoma -consistent with Marginal zone lymphoma S/p R-CVP x  five cycles from 05/05/18 through 08/18/18.  Patient with progression of disease with bulky abdominal pelvic disease with significant right groin bulky adenopathy. Patient was started on weekly Rituxan and received 2 total weekly treatments on 11/21/2020 and 11/28/2020 and developed serum sickness like reaction which is why it was discontinued.  2) h/o Serum sickness-like reaction to Rituxan. (2nd weekly dose)  3) petechiae on lower extremities after starting ibrutinib -now resolved and has not recurred with reintroduction of ibrutinib  4) mild to moderate epistaxis x2 episodes.-- resolved Patient is now off Celexa which was likely causing platelet dysfunction in addition to her Ibrutinib.  PLAN: -Discussed lab results from 04/12/2022 with the patient. CBC was stable. CMP showed elevated AST of 51 and elevated ALT of 67. -Recommended to hold Simvastatin due to elevated AST and ALT.  -Recommended not to consume  alcohol due to elevated AST and ALT. -Advised the patient to start back Ibrutinib 464m po daily -f/u with orthopedics as scheduled.  FOLLOW UP: RTC with Dr KIrene Limbowith labs in 4 weeks  The total time spent in the appointment was 20 minutes* .  All of the patient's questions were answered with apparent satisfaction. The patient knows to call the clinic with any problems, questions or concerns.   GSullivan LoneMD MS AAHIVMS SClara Barton HospitalCSsm St Clare Surgical Center LLCHematology/Oncology Physician CKooten[facility] .*Total Encounter Time as defined by the Centers for Medicare and Medicaid Services includes, in addition to the face-to-face time of a patient visit (documented in the note above) non-face-to-face time: obtaining and reviewing outside history, ordering and reviewing medications, tests or procedures, care coordination (communications with other health care professionals or caregivers) and documentation in the medical record.   IZettie Cooley am acting as a sEducation administratorfor GSullivan Lone MD.

## 2022-04-15 ENCOUNTER — Telehealth: Payer: Self-pay | Admitting: Hematology

## 2022-04-15 NOTE — Telephone Encounter (Signed)
Called patient per 2/14 los notes to schedule f/u. Left voicemail with new appointment information and contact details if needing to reschedule.

## 2022-04-18 IMAGING — CR DG CHEST 2V
1 series · 2 of 2 positions shown · non-contrast
Comparison: 12/01/2020

CLINICAL DATA: Cough and congestion 4 days.  On antibiotics

EXAM:
CHEST - 2 VIEW

[Series 1: dg chest 2 view · 0.14mm/px · 2 of 2 slices shown]
[im 1/2]
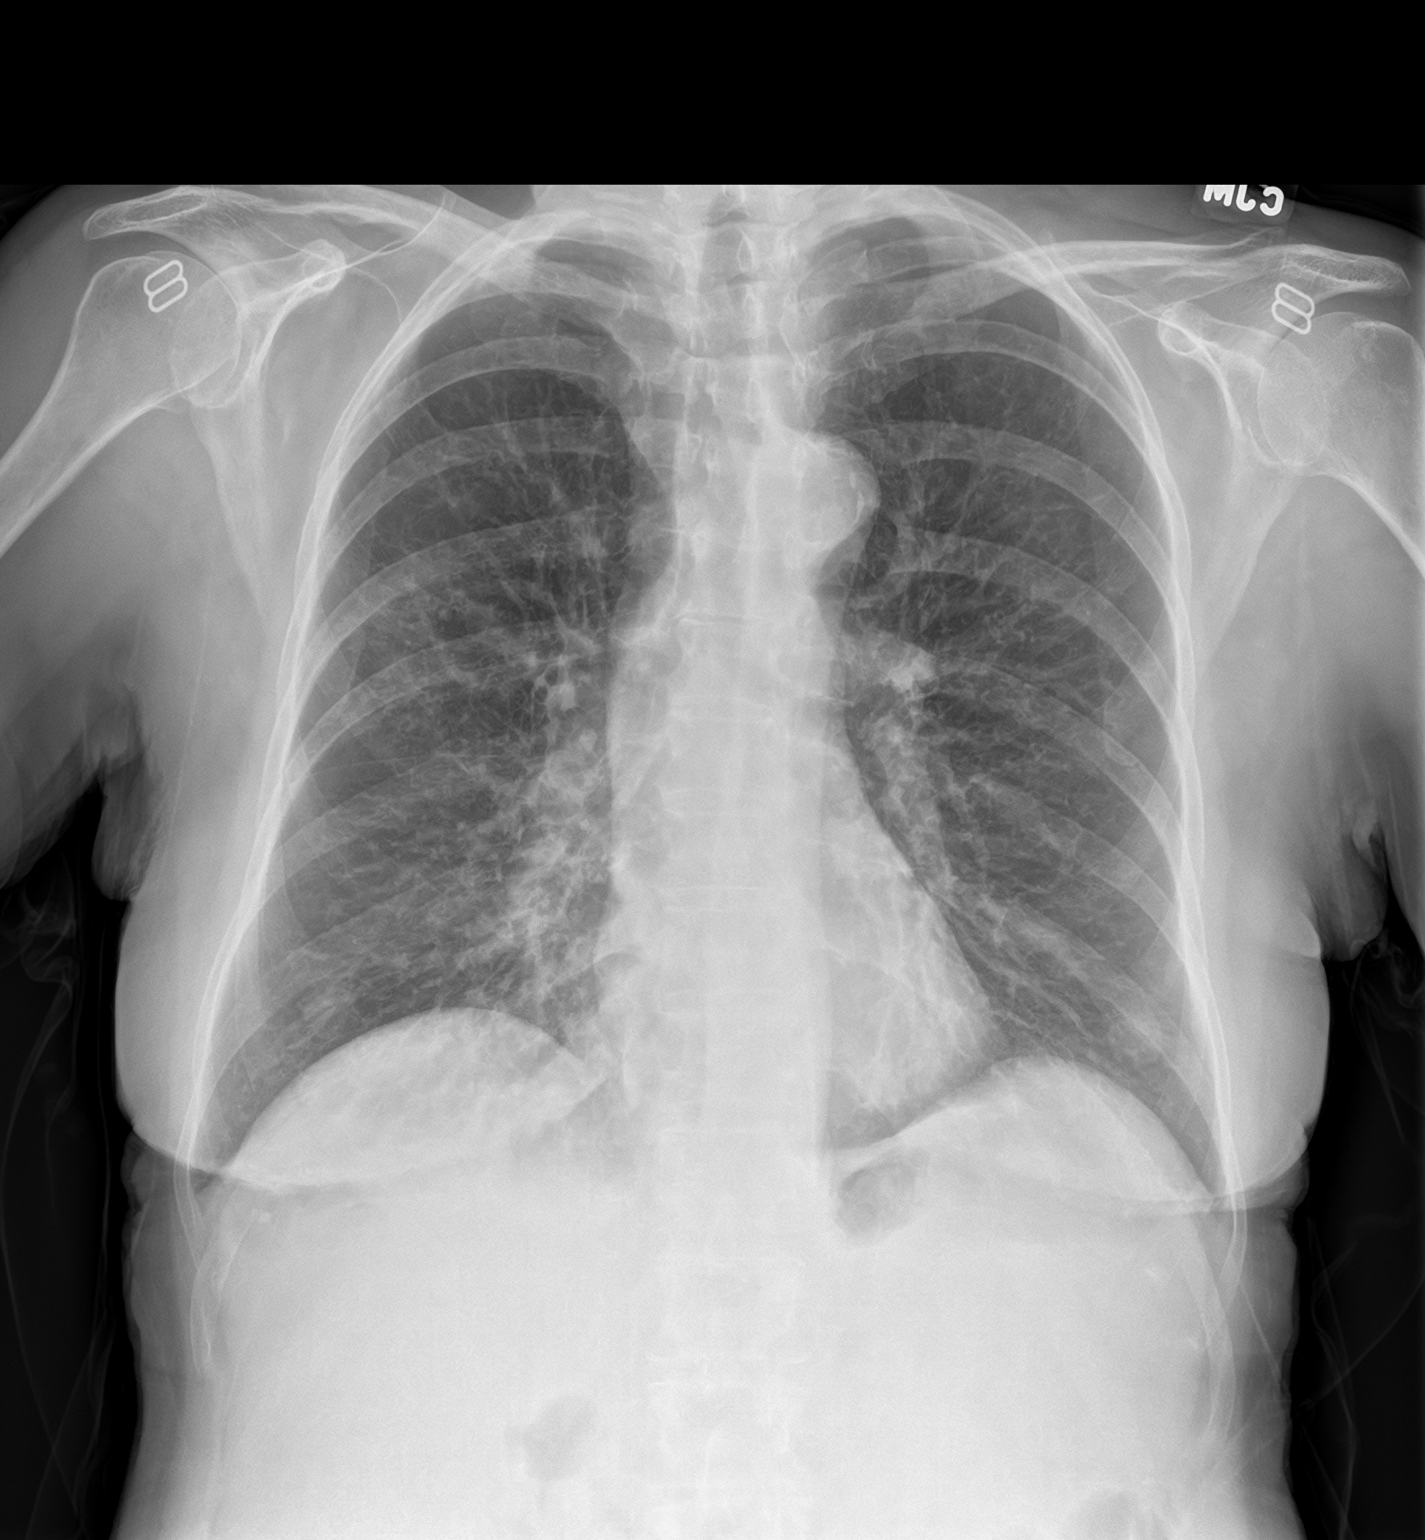
[im 2/2]
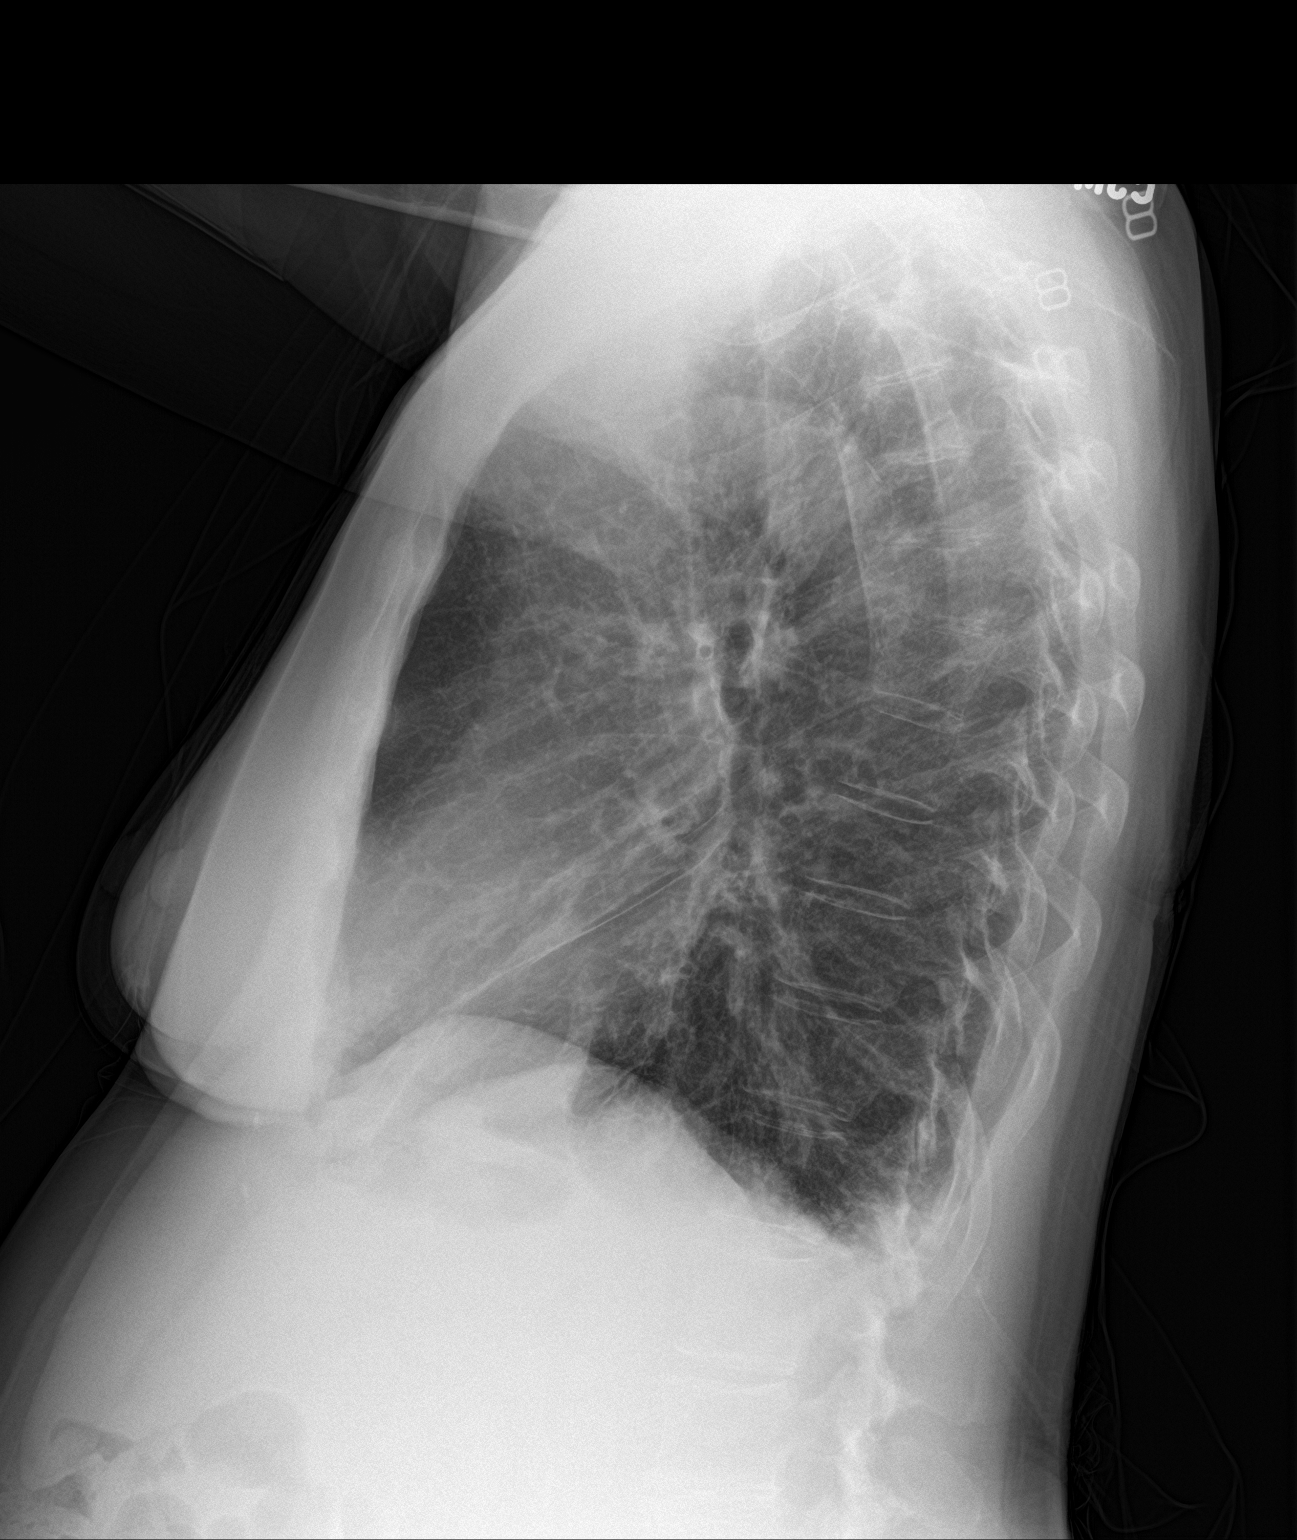

[2 of 2 positions shown; findings below may reference images not displayed]

FINDINGS: Mild patchy airspace disease in the right middle lobe and lower lobe
is new since the prior study. . No associated effusion

Heart size and vascularity normal. Atherosclerotic calcification
aortic arch. New pulse shadow noted bilaterally in the bases. No
pleural effusion.
IMPRESSION: Right middle lobe and right upper lower infiltrates suggestive of
pneumonia.

## 2022-04-20 ENCOUNTER — Encounter: Payer: Self-pay | Admitting: Hematology

## 2022-04-27 ENCOUNTER — Telehealth: Payer: Self-pay | Admitting: Nurse Practitioner

## 2022-04-27 NOTE — Telephone Encounter (Signed)
It is okay to have done in [place] if patient is agreeable.

## 2022-04-27 NOTE — Telephone Encounter (Signed)
Sent a Therapist, music with scheduling # ELEA

## 2022-04-27 NOTE — Telephone Encounter (Signed)
Message routed to [facility] for determination.

## 2022-04-27 NOTE — Telephone Encounter (Signed)
Pt had last Dexa done at [facility] clinic on 03/04/20.  Called [facility] to schedule she states that if you want to compare her last Dexa she would need to go back to [place] if provider doesn't want to compare she can it done at [facility].  Please advise

## 2022-05-01 ENCOUNTER — Other Ambulatory Visit: Payer: Self-pay | Admitting: Nurse Practitioner

## 2022-05-04 ENCOUNTER — Other Ambulatory Visit (HOSPITAL_COMMUNITY): Payer: Self-pay

## 2022-05-06 ENCOUNTER — Other Ambulatory Visit: Payer: Self-pay

## 2022-05-06 DIAGNOSIS — C8580 Other specified types of non-Hodgkin lymphoma, unspecified site: Secondary | ICD-10-CM

## 2022-05-07 ENCOUNTER — Inpatient Hospital Stay (HOSPITAL_BASED_OUTPATIENT_CLINIC_OR_DEPARTMENT_OTHER): Payer: Medicare PPO | Admitting: Hematology

## 2022-05-07 ENCOUNTER — Other Ambulatory Visit (HOSPITAL_COMMUNITY): Payer: Self-pay

## 2022-05-07 ENCOUNTER — Inpatient Hospital Stay: Payer: Medicare PPO | Attending: Hematology

## 2022-05-07 ENCOUNTER — Other Ambulatory Visit: Payer: Self-pay

## 2022-05-07 VITALS — BP 129/72 | HR 92 | Temp 97.2°F | Resp 14 | Wt 147.0 lb

## 2022-05-07 DIAGNOSIS — Z8042 Family history of malignant neoplasm of prostate: Secondary | ICD-10-CM | POA: Insufficient documentation

## 2022-05-07 DIAGNOSIS — C8580 Other specified types of non-Hodgkin lymphoma, unspecified site: Secondary | ICD-10-CM | POA: Diagnosis not present

## 2022-05-07 DIAGNOSIS — C8515 Unspecified B-cell lymphoma, lymph nodes of inguinal region and lower limb: Secondary | ICD-10-CM | POA: Diagnosis present

## 2022-05-07 DIAGNOSIS — I10 Essential (primary) hypertension: Secondary | ICD-10-CM | POA: Diagnosis not present

## 2022-05-07 LAB — CBC WITH DIFFERENTIAL (CANCER CENTER ONLY)
Abs Immature Granulocytes: 0.02 10*3/uL (ref 0.00–0.07)
Basophils Absolute: 0 10*3/uL (ref 0.0–0.1)
Basophils Relative: 1 %
Eosinophils Absolute: 0 10*3/uL (ref 0.0–0.5)
Eosinophils Relative: 1 %
HCT: 43.9 % (ref 36.0–46.0)
Hemoglobin: 15.1 g/dL — ABNORMAL HIGH (ref 12.0–15.0)
Immature Granulocytes: 0 %
Lymphocytes Relative: 44 %
Lymphs Abs: 2.9 10*3/uL (ref 0.7–4.0)
MCH: 31.8 pg (ref 26.0–34.0)
MCHC: 34.4 g/dL (ref 30.0–36.0)
MCV: 92.4 fL (ref 80.0–100.0)
Monocytes Absolute: 1.2 10*3/uL — ABNORMAL HIGH (ref 0.1–1.0)
Monocytes Relative: 18 %
Neutro Abs: 2.3 10*3/uL (ref 1.7–7.7)
Neutrophils Relative %: 36 %
Platelet Count: 115 10*3/uL — ABNORMAL LOW (ref 150–400)
RBC: 4.75 MIL/uL (ref 3.87–5.11)
RDW: 12.9 % (ref 11.5–15.5)
WBC Count: 6.5 10*3/uL (ref 4.0–10.5)
nRBC: 0 % (ref 0.0–0.2)

## 2022-05-07 LAB — CMP (CANCER CENTER ONLY)
ALT: 13 U/L (ref 0–44)
AST: 13 U/L — ABNORMAL LOW (ref 15–41)
Albumin: 3.7 g/dL (ref 3.5–5.0)
Alkaline Phosphatase: 49 U/L (ref 38–126)
Anion gap: 5 (ref 5–15)
BUN: 23 mg/dL (ref 8–23)
CO2: 31 mmol/L (ref 22–32)
Calcium: 9.4 mg/dL (ref 8.9–10.3)
Chloride: 103 mmol/L (ref 98–111)
Creatinine: 0.93 mg/dL (ref 0.44–1.00)
GFR, Estimated: >60 mL/min (ref >60)
Glucose, Bld: 96 mg/dL (ref 70–99)
Potassium: 4.3 mmol/L (ref 3.5–5.1)
Sodium: 139 mmol/L (ref 135–145)
Total Bilirubin: 0.8 mg/dL (ref 0.3–1.2)
Total Protein: 7.7 g/dL (ref 6.5–8.1)

## 2022-05-07 LAB — LACTATE DEHYDROGENASE: LDH: 88 U/L — ABNORMAL LOW (ref 98–192)

## 2022-05-07 NOTE — Progress Notes (Signed)
HEMATOLOGY/ONCOLOGY CLINIC NOTE  Date of Service: 05/07/22  Patient Care Team: [name], NP as PCP - General (Geriatric Medicine)  CHIEF COMPLAINTS/PURPOSE OF CONSULTATION:  Follow-up for continued evaluation and management of relapsed marginal zone lymphoma  ONCOLOGIC Hx:  B-cell lymphoproliferative disorder, NOS, diagnosed originally by fine needle aspiration of right groin lymph node on 10/25/12  S/p excisional biopsy of right inguinal lymph node on 12/08/12 with finding of B cell lymphoproliferative disorder with indistinct phenotype.  PET and CT scans performed on 12/21/12 consistent with clinical stage disease with adenopathy in both inguinal and femoral areas and left posterior triangle in the neck and probably the left axilla.  Completion of five cycles of CVP-R from 05/05/18 through 08/18/18.   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 female is here with her husband for continued evaluation and management of her marginal zone lymphoma. Patient was last seen by me on 04/14/2022 and complained of left arm/elbow bruises and a small nodule near her left wrist following a fall.   Today, she is accompanied by her husband and daughter. She reports a recent fall due to slipping two weeks ago on wet leaves that had initially left her with extensive bruising on left arm. This has since resolved. She discontinued her medications for a couple weeks after her fall due to significant progression of her bruising.  She denies any new medications, SOB, chest pain palpitations, abdominal pain or distention. No diarrhea, constipation, urinary symptoms, abdominal pain, fever, chills, night sweats, or skin rashes. She denies any blood in urine or blood in  stools.  She endorses a bumps on her left wrist, but is unsure of the reason. She does not believe its a hematoma. She reports that it is not painful and has noticed that it has decreased in size.  She denies any other major injuries from her recent fall and she has not had any issues with Ibrutinib treatment. She does endorse some mild leg swelling.  She does have glycemia but consumes a regular diet and does not skip meals. She has been having glycemic reactions even prior to her Ibrutinib treatment. She reports that her reactions are usually triggered by sugar such as syrup for breakfast.  She expresses concern of dizzy spells and muscle spasms once she started a medication. She also experiences dizziness with bending. She reports that her BP at home has been normal. She reports that her BP generally 120/70 but typically does not do below this reading.  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 Son is alternate    Would accept resuscitation   No tube feeds if cognitively unaware   Social Determinants of Health   Financial Resource Strain: Not on file  Food Insecurity: Not on file  Transportation Needs: Not on file  Physical Activity: Not on file  Stress: Not on file  Social Connections: Not on file  Intimate Partner Violence: Not on file    FAMILY HISTORY: Family History  Problem Relation Age of Onset   Alzheimer's disease Mother    Heart failure Father    Prostate cancer Father    Diabetes Maternal Grandfather    Breast cancer Neg Hx     ALLERGIES:  is allergic to cephalosporins and penicillins.  PCN - significant rash  Cephalosporins - significant rash   MEDICATIONS:  Current Outpatient Medications  Medication Sig Dispense Refill   Ascorbic Acid (VITAMIN C) 1000 MG tablet Take 1 tablet by mouth as directed. Takes off and on , sporadically     Cholecalciferol (VITAMIN D3) 50 MCG (2000 UT) capsule Take 1 capsule by mouth as directed. Takes off and of, sporadically     ibrutinib (IMBRUVICA) 140 MG capsule Take 3 capsules (420 mg total) by mouth daily. Take with a glass of water. 90 capsule 2   lisinopril (ZESTRIL) 5 MG tablet Take 1 tablet (5 mg total) by mouth daily. 90 tablet 0   Loratadine 10 MG CAPS Take 1 tablet by mouth daily.     simvastatin (ZOCOR) 20 MG tablet TAKE ONE TABLET BY MOUTH DAILY 90 tablet 0   temazepam (RESTORIL) 15 MG capsule Take one capsule by mouth every night at bedtime as needed for sleep. 30 capsule 5   vitamin E 180 MG (400 UNITS) capsule Take 400 Units by mouth as directed. Takes off and on, sporadically     No current facility-administered medications for this visit.  Lisinopril '10mg'$  -  HTN  Simvastatin '20mg'$  - HLD  REVIEW OF SYSTEMS:  .  10 Point review of Systems was done is negative except as noted above.   PHYSICAL EXAMINATION:   GENERAL:alert, in no acute distress and comfortable SKIN: no acute rashes, no significant lesions EYES:  conjunctiva are pink and non-injected, sclera anicteric OROPHARYNX: MMM, no exudates, no oropharyngeal erythema or ulceration NECK: supple, no JVD LYMPH:  no palpable lymphadenopathy in the cervical, axillary or inguinal regions LUNGS: clear to auscultation b/l with normal respiratory effort HEART: regular rate & rhythm ABDOMEN:  normoactive bowel sounds , non tender, not distended. Extremity: no pedal edema PSYCH: alert & oriented x 3 with fluent speech NEURO: no focal motor/sensory deficits   LABORATORY DATA:  I have reviewed the data as listed  .    Latest Ref Rng & Units 04/12/2022    1:54 PM 01/18/2022   10:45 AM 10/21/2021   11:33 AM  CBC  WBC 4.0 - 10.5 K/uL 4.7  4.5  6.5   Hemoglobin 12.0 - 15.0 g/dL 14.2  15.0  15.3   Hematocrit 36.0 - 46.0 % 42.2  45.3  44.7   Platelets 150 - 400 K/uL 101  99  103    .CBC    Component Value  Date/Time   WBC 4.7 04/12/2022 1354   RBC 4.55 04/12/2022 1354   HGB 14.2 04/12/2022 1354   HGB 15.0 01/18/2022 1045   HCT 42.2 04/12/2022 1354   PLT 101 (L) 04/12/2022 1354   PLT 99 (L) 01/18/2022 1045   MCV 92.7 04/12/2022 1354   MCH 31.2 04/12/2022 1354   MCHC 33.6 04/12/2022 1354   RDW 13.0 04/12/2022 1354   LYMPHSABS 1.3 04/12/2022 1354   MONOABS 0.5 04/12/2022 1354   EOSABS 0.0 04/12/2022 1354   BASOSABS 0.0 04/12/2022 1354        Latest Ref Rng & Units 04/12/2022    1:54 PM 01/18/2022   10:45 AM 12/11/2021   10:57 AM  CMP  Glucose 70 - 99 mg/dL 88  74    BUN 8 - 23 mg/dL 18  19    Creatinine 0.44 - 1.00 mg/dL 0.87  0.88  1.10   Sodium 135 - 145 mmol/L 137  141    Potassium 3.5 - 5.1 mmol/L 4.0  4.3    Chloride 98 - 111 mmol/L 100  105    CO2 22 - 32 mmol/L 26  33    Calcium 8.9 - 10.3 mg/dL 8.6  9.8    Total Protein 6.5 - 8.1 g/dL 7.9  7.5    Total Bilirubin 0.3 - 1.2 mg/dL 0.6  0.9    Alkaline Phos 38 - 126 U/L 85  49    AST 15 - 41 U/L 51  13    ALT 0 - 44 U/L 67  13     . Lab Results  Component Value Date   LDH  419 (H) 04/12/2022    SURGICAL PATHOLOGY  CASE: [id number]  PATIENT: Mohogany Steck  Surgical Pathology Report   Specimen Submitted:  A. Lymph node, right inguinal   Clinical History: History of low grade B cell lymphoma, now with right  inguinal lymphadenopathy, post US guided BX   DIAGNOSIS:  A. LYMPH NODE, RIGHT INGUINAL; ULTRASOUND-GUIDED BIOPSY:  - LOW-GRADE B-CELL LYMPHOMA WITH FEATURES MOST SUGGESTIVE OF MARGINAL  ZONE LYMPHOMA.   Comment:  Sections demonstrate small to medium sized lymphocytes with monocytoid  features.  Scattered plasma cells are present. Residual normal lymphoid  architecture is not identified.  Material was submitted for flow cytometric analysis which revealed a  monoclonal kappa B cell population that was negative for CD5, CD23, and  CD10 (see scanned report in [facility]).  A panel of immunohistochemical stains was performed with the following  pattern of immunoreactivity:  CD20: Positive in neoplastic lymphocytes  CD3: Highlights background T cells in a distribution similar to CD5  CD5: Highlights background T cells in a distribution similar to CD3  CD23: Highlights focal residual dendritic meshworks  CD10: Negative in neoplastic lymphocytes  BCL-2: Positive in neoplastic lymphocytes  BCL 6: Negative in neoplastic lymphocytes  Cyclin D1: Negative in neoplastic lymphocytes  Sox 11: Negative in neoplastic lymphocytes  Kappa-ISH: Positive in neoplastic lymphocytes  Lambda-ISH: Negative in neoplastic lymphocytes  Ki-67: Approximately 30%  The histologic features in conjunction with the immunophenotypic  findings support the diagnosis of a low-grade mature B-cell lymphoma  most suggestive of marginal zone lymphoma.   Sox 11 IHC and kappa / lambda ISH slides were prepared by [facility] [employer], [place], [place].  The remaining IHC slides were prepared by [facility], [place], [place].  All controls stained appropriately.   This test was developed and its  performance characteristics determined  by [facility]. It has not been cleared or approved by the [place] Food and Drug  Administration. The FDA does not require this test to go through  premarket FDA review. This test is used for clinical purposes. It should  not be regarded as investigational or for research. This laboratory is  certified under the Clinical Laboratory Improvement Amendments (CLIA) as  qualified to perform high complexity clinical laboratory testing.   GROSS DESCRIPTION:  A. Labeled: Right inguinal lymph node  Received: The specimen is received fresh on saline soaked gauze.  Tissue fragment(s): Multiple  Size: Range from 0.5 to 1.9 cm in length and 0.1 cm in diameter  Description: Received are multiple cores and fragments of tan-white soft  tissue.  1 touch prep with Diff-Quik stain is performed.  Representative  sections are submitted for flow cytometry in RPMI.  The remainder of the  specimen is submitted in cassettes 1-4 with 1 core per cassette.    Final Diagnosis performed by Quay Burow, MD.   Electronically signed  08/31/2019 1:38:09PM  The electronic signature indicates that the named Attending Pathologist  has evaluated the specimen  Technical component performed at [facility], [address], [place],  [place] [address] Lab: [phone] Dir: Rush Farmer, MD, MMM   Professional component performed at [facility], [facility], [place], [place], [place] [address] Lab: [phone]  Dir: Dellia Nims. Reuel Derby, MD   RADIOGRAPHIC STUDIES: I have personally reviewed the radiological images as listed and agreed with the findings in the report. No results found.  ASSESSMENT & PLAN:   77 y.o. with   1) Atleast Stage IIIA Low grade B cell Non Hodgkins Lymphoma -consistent with Marginal zone lymphoma S/p R-CVP x  five cycles from 05/05/18 through 08/18/18.  Patient with progression of  disease with bulky abdominal pelvic disease with significant right groin bulky adenopathy. Patient was started on weekly Rituxan and received 2 total weekly treatments on 11/21/2020 and 11/28/2020 and developed serum sickness like reaction which is why it was discontinued.  2) h/o Serum sickness-like reaction to Rituxan. (2nd weekly dose)  3) petechiae on lower extremities after starting ibrutinib -now resolved and has not recurred with reintroduction of ibrutinib  4) mild to moderate epistaxis x2 episodes.-- resolved Patient is now off Celexa which was likely causing platelet dysfunction in addition to her Ibrutinib.  PLAN:  -Discussed lab results on 05/07/2022 with patient. CBC showed WBC of 6.5K, hemoglobin of 15.1, and platelets of 115K. No lymphocytosis -CBC normal -other labs pending -Last imaging study October, 2023 revealed no increase in lymphocytes -recommend annual scans to ensure there is no progression of disease -abdominal nodes continue to shrink -labs in 3 months, repeat scans 3 months afterwards -current treatment has been tolerable, will continue Ibrutinib '420mg'$  po daily -Recommend patient to follow-up with orthopedics if wrist injury continues to be bothersome -left wrist lump may be a synovial cyst,  hematoma, or infusion in wrist joint  -glycemic reactions may be due to low blood sugar  FOLLOW UP: RTC with Dr Irene Limbo with labs in 3 months  The total time spent in the appointment was *** minutes* .  All of the patient's questions were answered with apparent satisfaction. The patient knows to call the clinic with any problems, questions or concerns.   Sullivan Lone MD MS AAHIVMS [facility] Hematology/Oncology Physician [facility]  .*Total Encounter Time as defined by the Centers for Medicare and Medicaid Services includes, in addition  to the face-to-face time of a patient visit (documented in the note above) non-face-to-face time: obtaining and reviewing  outside history, ordering and reviewing medications, tests or procedures, care coordination (communications with other health care professionals or caregivers) and documentation in the medical record.    I,Mitra Faeizi,acting as a Education administrator for Sullivan Lone, MD.,have documented all relevant documentation on the behalf of Sullivan Lone, MD,as directed by  Sullivan Lone, MD while in the presence of Sullivan Lone, MD.  ***

## 2022-05-10 ENCOUNTER — Other Ambulatory Visit: Payer: Self-pay

## 2022-05-13 ENCOUNTER — Encounter: Payer: Self-pay | Admitting: Hematology

## 2022-05-14 ENCOUNTER — Other Ambulatory Visit: Payer: Self-pay | Admitting: Nurse Practitioner

## 2022-06-01 ENCOUNTER — Other Ambulatory Visit (HOSPITAL_COMMUNITY): Payer: Self-pay

## 2022-06-02 DIAGNOSIS — Z0100 Encounter for examination of eyes and vision without abnormal findings: Secondary | ICD-10-CM | POA: Diagnosis not present

## 2022-06-02 DIAGNOSIS — H40003 Preglaucoma, unspecified, bilateral: Secondary | ICD-10-CM | POA: Diagnosis not present

## 2022-06-04 ENCOUNTER — Other Ambulatory Visit (HOSPITAL_COMMUNITY): Payer: Self-pay

## 2022-06-07 ENCOUNTER — Other Ambulatory Visit: Payer: Self-pay

## 2022-06-22 ENCOUNTER — Other Ambulatory Visit: Payer: Medicare PPO

## 2022-06-22 ENCOUNTER — Telehealth: Payer: Self-pay

## 2022-06-22 ENCOUNTER — Encounter: Payer: Self-pay | Admitting: Nurse Practitioner

## 2022-06-22 ENCOUNTER — Ambulatory Visit (SKILLED_NURSING_FACILITY): Payer: Medicare PPO | Admitting: Nurse Practitioner

## 2022-06-22 VITALS — BP 118/68 | HR 74 | Temp 97.1°F | Ht 63.0 in | Wt 148.0 lb

## 2022-06-22 DIAGNOSIS — H109 Unspecified conjunctivitis: Secondary | ICD-10-CM | POA: Diagnosis not present

## 2022-06-22 DIAGNOSIS — H547 Unspecified visual loss: Secondary | ICD-10-CM | POA: Diagnosis not present

## 2022-06-22 NOTE — Telephone Encounter (Signed)
Patient's husband called stating that they just left the eye doctor and patient was diagnosed with an eye infection. He wants to know if you would like to see them back in the clinic.  Message routed to Abbey Chatters, NP

## 2022-06-22 NOTE — Telephone Encounter (Signed)
Tried reaching husband,but no answer. Voicemail was left for him to call office when available.

## 2022-06-22 NOTE — Progress Notes (Addendum)
Careteam: Patient Care Team: Sharon Seller, NP as PCP - General (Geriatric Medicine)  Advanced Directive information Does Patient Have a Medical Advance Directive?: Yes, Would patient like information on creating a medical advance directive?: No - Patient declined, Type of Advance Directive: Out of facility DNR (pink MOST or yellow form), Does patient want to make changes to medical advance directive?: No - Patient declined  Allergies  Allergen Reactions   Cephalosporins     Rash   Penicillins     Significant Rash    Chief Complaint  Patient presents with   Acute Visit    Complains of Right eye Red and swollen when she woke up this morning. Pain in Right side and cough.    Quality Metric Gaps    To discuss need for Zoster Vaccine or postpone if patient refuses.      HPI: Patient is a 77 y.o. female seen in today at the [facility] for red, painful right eye. No injury She can not see out of eye.  Woke up with it.  Had some redness yesterday.    Review of Systems:  Review of Systems  Constitutional:  Negative for chills and fever.  Eyes:  Positive for blurred vision, pain and redness.    Past Medical History:  Diagnosis Date   Generalized osteoarthritis    H/O benign breast biopsy 1964   H/O bilateral cataract extraction 2005   Hyperlipidemia    Hypertension    Mood disorder    Non Hodgkin's lymphoma    Pancreatitis 10/20/2012   unknown etiology   Postmenopausal    S/P ankle arthrodesis 2012   Right   Sleep disorder    Uterine fibroid    Past Surgical History:  Procedure Laterality Date   ABDOMINAL HYSTERECTOMY  1997   ANKLE ARTHRODESIS Right 2012   BREAST BIOPSY Left 1969   fibroadenoma   CATARACT EXTRACTION W/ INTRAOCULAR LENS IMPLANT Bilateral    LYMPH NODE BIOPSY  2014   excisional biopsy right groin   Thumb surgery     removal of part of extensor tendon   Social History:   reports that she has never smoked. She has never used  smokeless tobacco. She reports current alcohol use. She reports that she does not use drugs.  Family History  Problem Relation Age of Onset   Alzheimer's disease Mother    Heart failure Father    Prostate cancer Father    Diabetes Maternal Grandfather    Breast cancer Neg Hx     Medications: Patient's Medications  New Prescriptions   No medications on file  Previous Medications   ASCORBIC ACID (VITAMIN C) 1000 MG TABLET    Take 1 tablet by mouth as directed. Takes off and on , sporadically   CHOLECALCIFEROL (VITAMIN D3) 50 MCG (2000 UT) CAPSULE    Take 1 capsule by mouth as directed. Takes off and of, sporadically   IBRUTINIB (IMBRUVICA) 140 MG CAPSULE    Take 3 capsules (420 mg total) by mouth daily. Take with a glass of water.   LISINOPRIL (ZESTRIL) 5 MG TABLET    TAKE 1 TABLET EVERY DAY   LORATADINE 10 MG CAPS    Take 1 tablet by mouth daily.   SIMVASTATIN (ZOCOR) 20 MG TABLET    TAKE 1 TABLET EVERY DAY   TEMAZEPAM (RESTORIL) 15 MG CAPSULE    Take one capsule by mouth every night at bedtime as needed for sleep.   VITAMIN E  180 MG (400 UNITS) CAPSULE    Take 400 Units by mouth as directed. Takes off and on, sporadically  Modified Medications   No medications on file  Discontinued Medications   No medications on file    Physical Exam:  Vitals:   06/22/22 0858  BP: 118/68  Pulse: 74  Temp: (!) 97.1 F (36.2 C)  SpO2: 96%  Weight: 148 lb (67.1 kg)  Height: 5\' 3"  (1.6 m)   Body mass index is 26.22 kg/m. Wt Readings from Last 3 Encounters:  06/22/22 148 lb (67.1 kg)  05/07/22 147 lb (66.7 kg)  04/05/22 149 lb (67.6 kg)    Physical Exam Eyes:     Conjunctiva/sclera:     Right eye: Right conjunctiva is injected.     Comments: Right eye and lid swollen with redness to sclerae  Neurological:     Mental Status: She is alert.     Labs reviewed: Basic Metabolic Panel: Recent Labs    01/18/22 1045 04/12/22 1354 05/07/22 1100  NA 141 137 139  K 4.3 4.0 4.3  CL  105 100 103  CO2 33* 26 31  GLUCOSE 74 88 96  BUN 19 18 23   CREATININE 0.88 0.87 0.93  CALCIUM 9.8 8.6* 9.4   Liver Function Tests: Recent Labs    01/18/22 1045 04/12/22 1354 05/07/22 1100  AST 13* 51* 13*  ALT 13 67* 13  ALKPHOS 49 85 49  BILITOT 0.9 0.6 0.8  PROT 7.5 7.9 7.7  ALBUMIN 4.0 3.6 3.7   No results for input(s): "LIPASE", "AMYLASE" in the last 8760 hours. No results for input(s): "AMMONIA" in the last 8760 hours. CBC: Recent Labs    01/18/22 1045 04/12/22 1354 05/07/22 1100  WBC 4.5 4.7 6.5  NEUTROABS 2.1 2.9 2.3  HGB 15.0 14.2 15.1*  HCT 45.3 42.2 43.9  MCV 92.6 92.7 92.4  PLT 99* 101* 115*   Lipid Panel: No results for input(s): "CHOL", "HDL", "LDLCALC", "TRIG", "CHOLHDL", "LDLDIRECT" in the last 8760 hours. TSH: No results for input(s): "TSH" in the last 8760 hours. A1C: No results found for: "HGBA1C"   Assessment/Plan 1. Loss of vision Due to loss of vision, redness, swollen eye and lid pts ophthalmologist called and appt made. She left clinic to be evaluated at [facility] eye.   Discussed follow up after ophthalmology for any additional issues, pt and husband in agreement.  Janene Harvey. Biagio Borg  [facility] [phone]

## 2022-06-22 NOTE — Telephone Encounter (Signed)
Not unless she was instructed to. Would like to get records from eye doctor to place in chart if we can request those.

## 2022-06-23 ENCOUNTER — Encounter: Payer: Medicare PPO | Admitting: Student

## 2022-06-23 DIAGNOSIS — H1131 Conjunctival hemorrhage, right eye: Secondary | ICD-10-CM | POA: Diagnosis not present

## 2022-06-23 DIAGNOSIS — H109 Unspecified conjunctivitis: Secondary | ICD-10-CM | POA: Diagnosis not present

## 2022-06-24 ENCOUNTER — Other Ambulatory Visit: Payer: Self-pay

## 2022-06-24 ENCOUNTER — Emergency Department: Payer: Medicare PPO

## 2022-06-24 ENCOUNTER — Inpatient Hospital Stay
Admission: EM | Admit: 2022-06-24 | Discharge: 2022-06-30 | DRG: 871 | Disposition: E | Payer: Medicare PPO | Attending: Internal Medicine | Admitting: Internal Medicine

## 2022-06-24 ENCOUNTER — Inpatient Hospital Stay: Payer: Medicare PPO

## 2022-06-24 ENCOUNTER — Encounter: Payer: Self-pay | Admitting: Emergency Medicine

## 2022-06-24 DIAGNOSIS — J9690 Respiratory failure, unspecified, unspecified whether with hypoxia or hypercapnia: Secondary | ICD-10-CM | POA: Diagnosis not present

## 2022-06-24 DIAGNOSIS — M79604 Pain in right leg: Secondary | ICD-10-CM | POA: Diagnosis not present

## 2022-06-24 DIAGNOSIS — Z515 Encounter for palliative care: Secondary | ICD-10-CM | POA: Diagnosis not present

## 2022-06-24 DIAGNOSIS — Z820 Family history of epilepsy and other diseases of the nervous system: Secondary | ICD-10-CM

## 2022-06-24 DIAGNOSIS — R7401 Elevation of levels of liver transaminase levels: Secondary | ICD-10-CM | POA: Insufficient documentation

## 2022-06-24 DIAGNOSIS — R9082 White matter disease, unspecified: Secondary | ICD-10-CM | POA: Diagnosis not present

## 2022-06-24 DIAGNOSIS — J9601 Acute respiratory failure with hypoxia: Secondary | ICD-10-CM | POA: Diagnosis not present

## 2022-06-24 DIAGNOSIS — E871 Hypo-osmolality and hyponatremia: Secondary | ICD-10-CM | POA: Diagnosis present

## 2022-06-24 DIAGNOSIS — A419 Sepsis, unspecified organism: Secondary | ICD-10-CM | POA: Diagnosis not present

## 2022-06-24 DIAGNOSIS — J13 Pneumonia due to Streptococcus pneumoniae: Secondary | ICD-10-CM | POA: Diagnosis not present

## 2022-06-24 DIAGNOSIS — R7881 Bacteremia: Secondary | ICD-10-CM | POA: Insufficient documentation

## 2022-06-24 DIAGNOSIS — E872 Acidosis, unspecified: Secondary | ICD-10-CM | POA: Diagnosis present

## 2022-06-24 DIAGNOSIS — R6520 Severe sepsis without septic shock: Secondary | ICD-10-CM

## 2022-06-24 DIAGNOSIS — I10 Essential (primary) hypertension: Secondary | ICD-10-CM | POA: Diagnosis present

## 2022-06-24 DIAGNOSIS — Z4682 Encounter for fitting and adjustment of non-vascular catheter: Secondary | ICD-10-CM | POA: Diagnosis not present

## 2022-06-24 DIAGNOSIS — I700 Atherosclerosis of aorta: Secondary | ICD-10-CM | POA: Diagnosis present

## 2022-06-24 DIAGNOSIS — Z66 Do not resuscitate: Secondary | ICD-10-CM | POA: Diagnosis not present

## 2022-06-24 DIAGNOSIS — W1830XA Fall on same level, unspecified, initial encounter: Secondary | ICD-10-CM | POA: Diagnosis present

## 2022-06-24 DIAGNOSIS — R0689 Other abnormalities of breathing: Secondary | ICD-10-CM | POA: Diagnosis not present

## 2022-06-24 DIAGNOSIS — Z7189 Other specified counseling: Secondary | ICD-10-CM | POA: Diagnosis not present

## 2022-06-24 DIAGNOSIS — Z880 Allergy status to penicillin: Secondary | ICD-10-CM | POA: Diagnosis not present

## 2022-06-24 DIAGNOSIS — Z881 Allergy status to other antibiotic agents status: Secondary | ICD-10-CM | POA: Diagnosis not present

## 2022-06-24 DIAGNOSIS — Z8249 Family history of ischemic heart disease and other diseases of the circulatory system: Secondary | ICD-10-CM

## 2022-06-24 DIAGNOSIS — Z833 Family history of diabetes mellitus: Secondary | ICD-10-CM | POA: Diagnosis not present

## 2022-06-24 DIAGNOSIS — J189 Pneumonia, unspecified organism: Secondary | ICD-10-CM | POA: Diagnosis not present

## 2022-06-24 DIAGNOSIS — Z043 Encounter for examination and observation following other accident: Secondary | ICD-10-CM | POA: Diagnosis not present

## 2022-06-24 DIAGNOSIS — G9340 Encephalopathy, unspecified: Secondary | ICD-10-CM | POA: Diagnosis not present

## 2022-06-24 DIAGNOSIS — Z1152 Encounter for screening for COVID-19: Secondary | ICD-10-CM

## 2022-06-24 DIAGNOSIS — R0902 Hypoxemia: Secondary | ICD-10-CM | POA: Diagnosis not present

## 2022-06-24 DIAGNOSIS — R4182 Altered mental status, unspecified: Secondary | ICD-10-CM | POA: Diagnosis not present

## 2022-06-24 DIAGNOSIS — A403 Sepsis due to Streptococcus pneumoniae: Principal | ICD-10-CM | POA: Diagnosis present

## 2022-06-24 DIAGNOSIS — J918 Pleural effusion in other conditions classified elsewhere: Secondary | ICD-10-CM | POA: Diagnosis present

## 2022-06-24 DIAGNOSIS — G002 Streptococcal meningitis: Secondary | ICD-10-CM | POA: Diagnosis present

## 2022-06-24 DIAGNOSIS — H1031 Unspecified acute conjunctivitis, right eye: Secondary | ICD-10-CM | POA: Diagnosis not present

## 2022-06-24 DIAGNOSIS — R945 Abnormal results of liver function studies: Secondary | ICD-10-CM | POA: Diagnosis not present

## 2022-06-24 DIAGNOSIS — Z8572 Personal history of non-Hodgkin lymphomas: Secondary | ICD-10-CM

## 2022-06-24 DIAGNOSIS — R739 Hyperglycemia, unspecified: Secondary | ICD-10-CM | POA: Insufficient documentation

## 2022-06-24 DIAGNOSIS — R000 Tachycardia, unspecified: Secondary | ICD-10-CM | POA: Diagnosis not present

## 2022-06-24 DIAGNOSIS — N179 Acute kidney failure, unspecified: Secondary | ICD-10-CM | POA: Diagnosis not present

## 2022-06-24 DIAGNOSIS — R162 Hepatomegaly with splenomegaly, not elsewhere classified: Secondary | ICD-10-CM | POA: Diagnosis not present

## 2022-06-24 DIAGNOSIS — J90 Pleural effusion, not elsewhere classified: Secondary | ICD-10-CM | POA: Diagnosis not present

## 2022-06-24 DIAGNOSIS — Z90710 Acquired absence of both cervix and uterus: Secondary | ICD-10-CM

## 2022-06-24 DIAGNOSIS — G9341 Metabolic encephalopathy: Secondary | ICD-10-CM | POA: Diagnosis not present

## 2022-06-24 DIAGNOSIS — D649 Anemia, unspecified: Secondary | ICD-10-CM | POA: Insufficient documentation

## 2022-06-24 DIAGNOSIS — E785 Hyperlipidemia, unspecified: Secondary | ICD-10-CM | POA: Diagnosis present

## 2022-06-24 DIAGNOSIS — M79605 Pain in left leg: Secondary | ICD-10-CM | POA: Diagnosis not present

## 2022-06-24 DIAGNOSIS — F0390 Unspecified dementia without behavioral disturbance: Secondary | ICD-10-CM | POA: Diagnosis present

## 2022-06-24 DIAGNOSIS — M7989 Other specified soft tissue disorders: Secondary | ICD-10-CM | POA: Diagnosis not present

## 2022-06-24 DIAGNOSIS — R296 Repeated falls: Secondary | ICD-10-CM | POA: Diagnosis present

## 2022-06-24 DIAGNOSIS — S20211A Contusion of right front wall of thorax, initial encounter: Secondary | ICD-10-CM | POA: Diagnosis not present

## 2022-06-24 DIAGNOSIS — S300XXA Contusion of lower back and pelvis, initial encounter: Secondary | ICD-10-CM | POA: Diagnosis present

## 2022-06-24 DIAGNOSIS — K828 Other specified diseases of gallbladder: Secondary | ICD-10-CM | POA: Diagnosis not present

## 2022-06-24 DIAGNOSIS — R7989 Other specified abnormal findings of blood chemistry: Secondary | ICD-10-CM | POA: Insufficient documentation

## 2022-06-24 DIAGNOSIS — Z79899 Other long term (current) drug therapy: Secondary | ICD-10-CM

## 2022-06-24 LAB — URINE DRUG SCREEN, QUALITATIVE (ARMC ONLY)
Amphetamines, Ur Screen: POSITIVE — AB
Barbiturates, Ur Screen: NOT DETECTED
Benzodiazepine, Ur Scrn: POSITIVE — AB
Cannabinoid 50 Ng, Ur ~~LOC~~: NOT DETECTED
Cocaine Metabolite,Ur ~~LOC~~: NOT DETECTED
MDMA (Ecstasy)Ur Screen: NOT DETECTED
Methadone Scn, Ur: NOT DETECTED
Opiate, Ur Screen: NOT DETECTED
Phencyclidine (PCP) Ur S: NOT DETECTED
Tricyclic, Ur Screen: NOT DETECTED

## 2022-06-24 LAB — BLOOD GAS, VENOUS
Acid-base deficit: 0.2 mmol/L (ref 0.0–2.0)
Bicarbonate: 18.6 mmol/L — ABNORMAL LOW (ref 20.0–28.0)
Bicarbonate: 24 mmol/L (ref 20.0–28.0)
O2 Saturation: 84.9 %
Patient temperature: 37
pCO2, Ven: 28 mmHg — ABNORMAL LOW (ref 44–60)
pCO2, Ven: 37 mmHg — ABNORMAL LOW (ref 44–60)
pH, Ven: 7.43 (ref 7.25–7.43)
pH, Ven: 7.42 (ref 7.25–7.43)
pO2, Ven: <31 mmHg — CL (ref 32–45)
pO2, Ven: 52 mmHg — ABNORMAL HIGH (ref 32–45)

## 2022-06-24 LAB — CBC WITH DIFFERENTIAL/PLATELET
Abs Immature Granulocytes: 1.67 10*3/uL — ABNORMAL HIGH (ref 0.00–0.07)
Basophils Absolute: 0.1 10*3/uL (ref 0.0–0.1)
Basophils Relative: 0 %
Eosinophils Absolute: 0 10*3/uL (ref 0.0–0.5)
Eosinophils Relative: 0 %
HCT: 34.4 % — ABNORMAL LOW (ref 36.0–46.0)
Hemoglobin: 11.9 g/dL — ABNORMAL LOW (ref 12.0–15.0)
Immature Granulocytes: 7 %
Lymphocytes Relative: 5 %
Lymphs Abs: 1.2 10*3/uL (ref 0.7–4.0)
MCH: 31.2 pg (ref 26.0–34.0)
MCHC: 34.6 g/dL (ref 30.0–36.0)
MCV: 90.1 fL (ref 80.0–100.0)
Monocytes Absolute: 0.9 10*3/uL (ref 0.1–1.0)
Monocytes Relative: 3 %
Neutro Abs: 21.4 10*3/uL — ABNORMAL HIGH (ref 1.7–7.7)
Neutrophils Relative %: 85 %
Platelets: 232 10*3/uL (ref 150–400)
RBC: 3.82 MIL/uL — ABNORMAL LOW (ref 3.87–5.11)
RDW: 13.6 % (ref 11.5–15.5)
Smear Review: NORMAL
WBC: 25.3 10*3/uL — ABNORMAL HIGH (ref 4.0–10.5)
nRBC: 0 % (ref 0.0–0.2)

## 2022-06-24 LAB — URINALYSIS, ROUTINE W REFLEX MICROSCOPIC
Bilirubin Urine: NEGATIVE
Glucose, UA: NEGATIVE mg/dL
Ketones, ur: NEGATIVE mg/dL
Leukocytes,Ua: NEGATIVE
Nitrite: NEGATIVE
Protein, ur: 100 mg/dL — AB
Specific Gravity, Urine: 1.02 (ref 1.005–1.030)
Squamous Epithelial / HPF: NONE SEEN /HPF (ref 0–5)
WBC, UA: NONE SEEN WBC/hpf (ref 0–5)
pH: 5 (ref 5.0–8.0)

## 2022-06-24 LAB — COMPREHENSIVE METABOLIC PANEL
ALT: 28 U/L (ref 0–44)
AST: 40 U/L (ref 15–41)
Albumin: 2.2 g/dL — ABNORMAL LOW (ref 3.5–5.0)
Alkaline Phosphatase: 197 U/L — ABNORMAL HIGH (ref 38–126)
Anion gap: 16 — ABNORMAL HIGH (ref 5–15)
BUN: 52 mg/dL — ABNORMAL HIGH (ref 8–23)
CO2: 17 mmol/L — ABNORMAL LOW (ref 22–32)
Calcium: 9.1 mg/dL (ref 8.9–10.3)
Chloride: 100 mmol/L (ref 98–111)
Creatinine, Ser: 2.25 mg/dL — ABNORMAL HIGH (ref 0.44–1.00)
GFR, Estimated: 22 mL/min — ABNORMAL LOW (ref >60)
Glucose, Bld: 218 mg/dL — ABNORMAL HIGH (ref 70–99)
Potassium: 4.7 mmol/L (ref 3.5–5.1)
Sodium: 133 mmol/L — ABNORMAL LOW (ref 135–145)
Total Bilirubin: 3.9 mg/dL — ABNORMAL HIGH (ref 0.3–1.2)
Total Protein: 7.2 g/dL (ref 6.5–8.1)

## 2022-06-24 LAB — RESPIRATORY PANEL BY PCR

## 2022-06-24 LAB — LACTATE DEHYDROGENASE: LDH: 167 U/L (ref 98–192)

## 2022-06-24 LAB — PROTEIN, PLEURAL OR PERITONEAL FLUID: Total protein, fluid: 4.7 g/dL

## 2022-06-24 LAB — BODY FLUID CELL COUNT WITH DIFFERENTIAL
Eos, Fluid: 0 %
Lymphs, Fluid: 5 %
Monocyte-Macrophage-Serous Fluid: 6 %
Neutrophil Count, Fluid: 89 %
Total Nucleated Cell Count, Fluid: 1972 cu mm

## 2022-06-24 LAB — LACTIC ACID, PLASMA
Lactic Acid, Venous: 4.5 mmol/L (ref 0.5–1.9)
Lactic Acid, Venous: 2 mmol/L (ref 0.5–1.9)
Lactic Acid, Venous: 1.4 mmol/L (ref 0.5–1.9)
Lactic Acid, Venous: 2.2 mmol/L (ref 0.5–1.9)

## 2022-06-24 LAB — GLUCOSE, PLEURAL OR PERITONEAL FLUID: Glucose, Fluid: 29 mg/dL

## 2022-06-24 LAB — BASIC METABOLIC PANEL
Anion gap: 11 (ref 5–15)
BUN: 54 mg/dL — ABNORMAL HIGH (ref 8–23)
CO2: 23 mmol/L (ref 22–32)
Calcium: 8.2 mg/dL — ABNORMAL LOW (ref 8.9–10.3)
Chloride: 99 mmol/L (ref 98–111)
Creatinine, Ser: 1.99 mg/dL — ABNORMAL HIGH (ref 0.44–1.00)
GFR, Estimated: 25 mL/min — ABNORMAL LOW (ref >60)
Glucose, Bld: 163 mg/dL — ABNORMAL HIGH (ref 70–99)
Potassium: 4.1 mmol/L (ref 3.5–5.1)
Sodium: 133 mmol/L — ABNORMAL LOW (ref 135–145)

## 2022-06-24 LAB — AMMONIA: Ammonia: 11 umol/L (ref 9–35)

## 2022-06-24 LAB — RESP PANEL BY RT-PCR (FLU A&B, COVID) ARPGX2
Influenza A by PCR: NEGATIVE
Influenza B by PCR: NEGATIVE
SARS Coronavirus 2 by RT PCR: NEGATIVE

## 2022-06-24 LAB — PROTEIN, TOTAL: Total Protein: 5.9 g/dL — ABNORMAL LOW (ref 6.5–8.1)

## 2022-06-24 LAB — GLUCOSE, CAPILLARY
Glucose-Capillary: 146 mg/dL — ABNORMAL HIGH (ref 70–99)
Glucose-Capillary: 155 mg/dL — ABNORMAL HIGH (ref 70–99)
Glucose-Capillary: 155 mg/dL — ABNORMAL HIGH (ref 70–99)
Glucose-Capillary: 163 mg/dL — ABNORMAL HIGH (ref 70–99)

## 2022-06-24 LAB — LACTATE DEHYDROGENASE, PLEURAL OR PERITONEAL FLUID: LD, Fluid: 1842 U/L — ABNORMAL HIGH (ref 3–23)

## 2022-06-24 LAB — CBG MONITORING, ED: Glucose-Capillary: 197 mg/dL — ABNORMAL HIGH (ref 70–99)

## 2022-06-24 LAB — TROPONIN I (HIGH SENSITIVITY)
Troponin I (High Sensitivity): 49 ng/L — ABNORMAL HIGH (ref <18)
Troponin I (High Sensitivity): 55 ng/L — ABNORMAL HIGH (ref <18)
Troponin I (High Sensitivity): 33 ng/L — ABNORMAL HIGH (ref <18)

## 2022-06-24 LAB — PROTIME-INR
INR: 1.3 — ABNORMAL HIGH (ref 0.8–1.2)
Prothrombin Time: 16.3 seconds — ABNORMAL HIGH (ref 11.4–15.2)

## 2022-06-24 LAB — STREP PNEUMONIAE URINARY ANTIGEN: Strep Pneumo Urinary Antigen: POSITIVE — AB

## 2022-06-24 LAB — PROCALCITONIN: Procalcitonin: 4.04 ng/mL

## 2022-06-24 LAB — CK: Total CK: 111 U/L (ref 38–234)

## 2022-06-24 LAB — MRSA NEXT GEN BY PCR, NASAL: MRSA by PCR Next Gen: NOT DETECTED

## 2022-06-24 MED ORDER — LACTATED RINGERS IV BOLUS
250.0000 mL | Freq: Once | INTRAVENOUS | Status: AC
  Administered 2022-06-24: 250 mL via INTRAVENOUS

## 2022-06-24 MED ORDER — LACTATED RINGERS IV SOLN: INTRAVENOUS | Status: DC

## 2022-06-24 MED ORDER — LACTATED RINGERS IV BOLUS
500.0000 mL | Freq: Once | INTRAVENOUS | Status: AC
  Administered 2022-06-24: 500 mL via INTRAVENOUS

## 2022-06-24 MED ORDER — SODIUM CHLORIDE 0.9 % IV SOLN
1.0000 g | Freq: Three times a day (TID) | INTRAVENOUS | Status: DC
  Administered 2022-06-24: 1 g via INTRAVENOUS
  Filled 2022-06-24 (×2): qty 5

## 2022-06-24 MED ORDER — FENTANYL CITRATE (PF) 100 MCG/2ML IJ SOLN
INTRAMUSCULAR | Status: AC | PRN
  Administered 2022-06-24: 50 ug via INTRAVENOUS

## 2022-06-24 MED ORDER — CHLORHEXIDINE GLUCONATE CLOTH 2 % EX PADS
6.0000 | MEDICATED_PAD | Freq: Every day | CUTANEOUS | Status: DC
  Administered 2022-06-24 – 2022-06-27 (×4): 6 via TOPICAL

## 2022-06-24 MED ORDER — METRONIDAZOLE 500 MG/100ML IV SOLN
500.0000 mg | Freq: Once | INTRAVENOUS | Status: AC
  Administered 2022-06-24: 500 mg via INTRAVENOUS
  Filled 2022-06-24: qty 100

## 2022-06-24 MED ORDER — SODIUM CHLORIDE 0.9 % IV SOLN
2.0000 g | Freq: Once | INTRAVENOUS | Status: AC
  Administered 2022-06-24: 2 g via INTRAVENOUS
  Filled 2022-06-24: qty 10

## 2022-06-24 MED ORDER — VANCOMYCIN VARIABLE DOSE PER UNSTABLE RENAL FUNCTION (PHARMACIST DOSING): Status: DC

## 2022-06-24 MED ORDER — BUDESONIDE 0.5 MG/2ML IN SUSP
0.5000 mg | Freq: Two times a day (BID) | RESPIRATORY_TRACT | Status: DC
  Administered 2022-06-24 – 2022-06-27 (×7): 0.5 mg via RESPIRATORY_TRACT
  Filled 2022-06-24 (×8): qty 2

## 2022-06-24 MED ORDER — INSULIN ASPART 100 UNIT/ML IJ SOLN
0.0000 [IU] | INTRAMUSCULAR | Status: DC
  Administered 2022-06-24: 1 [IU] via SUBCUTANEOUS
  Administered 2022-06-24 – 2022-06-25 (×3): 2 [IU] via SUBCUTANEOUS
  Administered 2022-06-25: 1 [IU] via SUBCUTANEOUS
  Administered 2022-06-26: 2 [IU] via SUBCUTANEOUS
  Administered 2022-06-26: 1 [IU] via SUBCUTANEOUS
  Administered 2022-06-26 (×2): 2 [IU] via SUBCUTANEOUS
  Administered 2022-06-26: 1 [IU] via SUBCUTANEOUS
  Administered 2022-06-27: 2 [IU] via SUBCUTANEOUS
  Administered 2022-06-27: 1 [IU] via SUBCUTANEOUS
  Administered 2022-06-27 – 2022-06-28 (×5): 2 [IU] via SUBCUTANEOUS
  Filled 2022-06-24 (×17): qty 1

## 2022-06-24 MED ORDER — LACTATED RINGERS IV BOLUS
1000.0000 mL | Freq: Once | INTRAVENOUS | Status: AC
  Administered 2022-06-24: 1000 mL via INTRAVENOUS

## 2022-06-24 MED ORDER — METRONIDAZOLE 500 MG/100ML IV SOLN
500.0000 mg | Freq: Two times a day (BID) | INTRAVENOUS | Status: DC
  Administered 2022-06-25 – 2022-06-26 (×5): 500 mg via INTRAVENOUS
  Filled 2022-06-24 (×6): qty 100

## 2022-06-24 MED ORDER — HEPARIN SODIUM (PORCINE) 5000 UNIT/ML IJ SOLN
5000.0000 [IU] | Freq: Three times a day (TID) | INTRAMUSCULAR | Status: DC
  Administered 2022-06-24 – 2022-06-28 (×12): 5000 [IU] via SUBCUTANEOUS
  Filled 2022-06-24 (×13): qty 1

## 2022-06-24 MED ORDER — VANCOMYCIN HCL 1500 MG/300ML IV SOLN
1500.0000 mg | Freq: Once | INTRAVENOUS | Status: AC
  Administered 2022-06-24: 1500 mg via INTRAVENOUS
  Filled 2022-06-24: qty 300

## 2022-06-24 MED ORDER — NALOXONE HCL 0.4 MG/ML IJ SOLN
0.4000 mg | Freq: Once | INTRAMUSCULAR | Status: AC
  Administered 2022-06-24: 0.4 mg via INTRAVENOUS
  Filled 2022-06-24: qty 1

## 2022-06-24 MED ORDER — ACETAMINOPHEN 10 MG/ML IV SOLN
1000.0000 mg | Freq: Four times a day (QID) | INTRAVENOUS | Status: AC
  Administered 2022-06-24 – 2022-06-25 (×3): 1000 mg via INTRAVENOUS
  Filled 2022-06-24 (×4): qty 100

## 2022-06-24 MED ORDER — POLYETHYLENE GLYCOL 3350 17 G PO PACK: 17.0000 g | PACK | Freq: Every day | ORAL | Status: DC | PRN

## 2022-06-24 MED ORDER — VANCOMYCIN HCL IN DEXTROSE 1-5 GM/200ML-% IV SOLN
1000.0000 mg | Freq: Once | INTRAVENOUS | Status: DC
  Filled 2022-06-24: qty 200

## 2022-06-24 MED ORDER — MIDAZOLAM HCL 2 MG/2ML IJ SOLN
INTRAMUSCULAR | Status: AC
  Filled 2022-06-24: qty 2

## 2022-06-24 MED ORDER — STERILE WATER FOR INJECTION IV SOLN
INTRAVENOUS | Status: DC
  Filled 2022-06-24: qty 1000
  Filled 2022-06-24: qty 150
  Filled 2022-06-24: qty 1000

## 2022-06-24 MED ORDER — LIDOCAINE HCL (PF) 1 % IJ SOLN
10.0000 mL | Freq: Once | INTRAMUSCULAR | Status: AC
  Administered 2022-06-24: 10 mL
  Filled 2022-06-24: qty 10

## 2022-06-24 MED ORDER — FENTANYL CITRATE (PF) 100 MCG/2ML IJ SOLN
INTRAMUSCULAR | Status: AC
  Filled 2022-06-24: qty 2

## 2022-06-24 MED ORDER — IPRATROPIUM-ALBUTEROL 0.5-2.5 (3) MG/3ML IN SOLN
3.0000 mL | RESPIRATORY_TRACT | Status: DC
  Administered 2022-06-24 – 2022-06-26 (×11): 3 mL via RESPIRATORY_TRACT
  Filled 2022-06-24 (×11): qty 3

## 2022-06-24 MED ORDER — DOCUSATE SODIUM 100 MG PO CAPS: 100.0000 mg | ORAL_CAPSULE | Freq: Two times a day (BID) | ORAL | Status: DC | PRN

## 2022-06-24 NOTE — Progress Notes (Signed)
Pt transported to and from CT for Chest Tube placement with RN without incident

## 2022-06-24 NOTE — ED Provider Notes (Signed)
[facility] Provider Note   Event Date/Time   First MD Initiated Contact with Patient 06/18/2022 1013     (approximate)  History   Altered Mental Status, Tachycardia, and Fall  EM caveat: Altered mental status  HPI  Sherry Mosley is a 77 y.o. female history of hypertension, non-Hodgkin's lymphoma  Per husband, couple days ago noticed an issue with her right eye.  Got seen at the eye doctor, ophthalmologist diagnosed as conjunctivitis.  Also couple days ago he recalls that she was complaining that her right ribs were sore.  She did not want to seek medical care at that time.  She seemed fatigued and 1 nights ago got up during the night and in that timeframe she fell.  She fell onto her buttock.  She did not appear to be otherwise injured.  Husband helped her back up she got to bed, and then this morning she was very confused.  Husband called 911 due to confusion.  She has not been able to respond to him normally    Physical Exam   Triage Vital Signs: ED Triage Vitals [06/23/2022 1015]  Enc Vitals Group     BP (!) 144/84     Pulse Rate (!) 151     Resp (!) 36     Temp      Temp src      SpO2 100 %     Weight      Height      Head Circumference      Peak Flow      Pain Score      Pain Loc      Pain Edu?      Excl. in GC?     Most recent vital signs: Vitals:   06/26/2022 1115 06/02/2022 1130  BP: (!) 152/103 (!) 156/88  Pulse: (!) 148   Resp: 19 (!) 28  Temp:    SpO2: 100%      General: Alert, but somewhat aloof stare.  Is able to tell me her name when asked, but follows no other commands.  She seems to this primarily stare forward, occasionally looks at things with conjugate gaze.  She is able to wiggle her toes and her feet, but very weak in both lower extremities.  She holds her arms sort of limp at her side, and when she does occasionally move them she will withdraw from IV stick, but otherwise appears to be globally fatigued.  No facial droop  noted.  Extraocular movements do not show entrapment or abnormality.  The right conjunctiva appears quite erythematous.  There is no purulent drainage.  The cornea and pupil itself are quite clear.  The left eye appears normal CV:  Good peripheral perfusion.  Tachycardia Resp:  Mild tachypnea.  Diminished lung sounds on the right.  Left side clear.  Mild to moderate accessory muscle use.  Tachypnea.  Appears to have increased work of breathing.  No wheezing Abd:  No distention.  Soft nontender nondistended.  There is noted a bruise that starts around her mid right axilla is relatively small, but extends down over her right upper quadrant.  Follows along her right flank Other:  Extremities appear atraumatic   ED Results / Procedures / Treatments   Labs (all labs ordered are listed, but only abnormal results are displayed) Labs Reviewed  COMPREHENSIVE METABOLIC PANEL - Abnormal; Notable for the following components:      Result Value   Sodium 133 (*)  CO2 17 (*)    Glucose, Bld 218 (*)    BUN 52 (*)    Creatinine, Ser 2.25 (*)    Albumin 2.2 (*)    Alkaline Phosphatase 197 (*)    Total Bilirubin 3.9 (*)    GFR, Estimated 22 (*)    Anion gap 16 (*)    All other components within normal limits  LACTIC ACID, PLASMA - Abnormal; Notable for the following components:   Lactic Acid, Venous 4.5 (*)    All other components within normal limits  CBC WITH DIFFERENTIAL/PLATELET - Abnormal; Notable for the following components:   WBC 25.3 (*)    RBC 3.82 (*)    Hemoglobin 11.9 (*)    HCT 34.4 (*)    Neutro Abs 21.4 (*)    Abs Immature Granulocytes 1.67 (*)    All other components within normal limits  PROTIME-INR - Abnormal; Notable for the following components:   Prothrombin Time 16.3 (*)    INR 1.3 (*)    All other components within normal limits  URINALYSIS, ROUTINE W REFLEX MICROSCOPIC - Abnormal; Notable for the following components:   Color, Urine AMBER (*)    APPearance CLOUDY (*)     Hgb urine dipstick SMALL (*)    Protein, ur 100 (*)    Bacteria, UA RARE (*)    All other components within normal limits  BLOOD GAS, VENOUS - Abnormal; Notable for the following components:   pCO2, Ven 28 (*)    pO2, Ven <31 (*)    Bicarbonate 18.6 (*)    Acid-base deficit 4.4 (*)    All other components within normal limits  CBG MONITORING, ED - Abnormal; Notable for the following components:   Glucose-Capillary 197 (*)    All other components within normal limits  TROPONIN I (HIGH SENSITIVITY) - Abnormal; Notable for the following components:   Troponin I (High Sensitivity) 49 (*)    All other components within normal limits  RESP PANEL BY RT-PCR (FLU A&B, COVID) ARPGX2  CULTURE, BLOOD (ROUTINE X 2)  CULTURE, BLOOD (ROUTINE X 2)  RESPIRATORY PANEL BY PCR  AMMONIA  CK  LACTIC ACID, PLASMA  STREP PNEUMONIAE URINARY ANTIGEN  LEGIONELLA PNEUMOPHILA SEROGP 1 UR AG  PROCALCITONIN   Labs interpreted as significant leukocytosis.  Chronic anemia.  Normal platelet count.  Reduced CO2 but normal pH on venous gas suggestive of an underlying metabolic acidosis  EKG  EKG interpreted by me at 1010 heart rate 150 QRS 80 QTc 460 Sinus tachycardia.  Nonspecific ST abnormality, no frank ischemia.  Possible rate related.  Cannot rule out ischemia   RADIOLOGY Chest x-ray interpreted by me as small to moderate right-sided pleural effusion with probable associated infiltrate.  No obvious rib fracture or traumatic injury noted    CT CHEST ABDOMEN PELVIS WO CONTRAST  Result Date: 06/13/2022 CLINICAL DATA:  Fall 1 day ago, bruising to right ribs, fever and tachycardia history of marginal zone lymphoma * Tracking Code: BO * EXAM: CT CHEST, ABDOMEN AND PELVIS WITHOUT CONTRAST TECHNIQUE: Multidetector CT imaging of the chest, abdomen and pelvis was performed following the standard protocol without IV contrast. RADIATION DOSE REDUCTION: This exam was performed according to the departmental  dose-optimization program which includes automated exposure control, adjustment of the mA and/or kV according to patient size and/or use of iterative reconstruction technique. COMPARISON:  CT chest abdomen pelvis, 12/11/2021 FINDINGS: Examination is generally limited by breath motion artifact and lack of intravenous contrast. CT CHEST FINDINGS  Cardiovascular: Aortic atherosclerosis. Normal heart size. No pericardial effusion. Mediastinum/Nodes: No enlarged mediastinal, hilar, or axillary lymph nodes. Thyroid gland, trachea, and esophagus demonstrate no significant findings. Lungs/Pleura: Moderate, loculated right pleural effusion with extensive associated atelectasis or consolidation. Bandlike scarring of the left lung base. Musculoskeletal: No chest wall abnormality. No acute osseous findings. CT ABDOMEN PELVIS FINDINGS Hepatobiliary: No solid liver abnormality is seen. Hepatomegaly, maximum coronal span 20.5 cm. No gallstones, gallbladder wall thickening, or biliary dilatation. Pancreas: Unremarkable. No pancreatic ductal dilatation or surrounding inflammatory changes. Spleen: Splenomegaly, maximum coronal span 14.2 cm. Adrenals/Urinary Tract: Adrenal glands are unremarkable. Kidneys are otherwise normal, without renal calculi, solid lesion, or hydronephrosis. Bladder is unremarkable. Stomach/Bowel: Stomach is within normal limits. Appendix appears normal. No evidence of bowel wall thickening, distention, or inflammatory changes. Vascular/Lymphatic: Aortic atherosclerosis. No significant change in retroperitoneal, iliac, inguinal, and mesenteric lymphadenopathy. Reproductive: Status post hysterectomy. Other: No abdominal hernia. Large contusion and hematoma of the left buttock (series 2, image 96). No ascites. Musculoskeletal: No acute osseous findings. IMPRESSION: 1. Examination is generally limited by breath motion artifact and lack of intravenous contrast. 2. Moderate, loculated right pleural effusion with  extensive associated atelectasis or consolidation. No overlying rib fracture. 3. Large contusion and hematoma of the left buttock. 4. No significant change in retroperitoneal, iliac, inguinal, and mesenteric lymphadenopathy, as well as splenomegaly, in keeping with history of lymphoma. 5. Hepatomegaly. Aortic Atherosclerosis (ICD10-I70.0). Electronically Signed   By: Jearld Lesch M.D.   On: 06/07/2022 11:53   CT Head Wo Contrast  Result Date: 05/31/2022 CLINICAL DATA:  Fall 1 day ago, facial trauma EXAM: CT HEAD WITHOUT CONTRAST CT CERVICAL SPINE WITHOUT CONTRAST TECHNIQUE: Multidetector CT imaging of the head and cervical spine was performed following the standard protocol without intravenous contrast. Multiplanar CT image reconstructions of the cervical spine were also generated. RADIATION DOSE REDUCTION: This exam was performed according to the departmental dose-optimization program which includes automated exposure control, adjustment of the mA and/or kV according to patient size and/or use of iterative reconstruction technique. COMPARISON:  None Available. FINDINGS: CT HEAD FINDINGS Brain: No evidence of acute infarction, hemorrhage, hydrocephalus, extra-axial collection or mass lesion/mass effect. Periventricular and deep white matter hypodensity. Vascular: No hyperdense vessel or unexpected calcification. Skull: Normal. Negative for fracture or focal lesion. Sinuses/Orbits: No acute finding. Other: None. CT CERVICAL SPINE FINDINGS Alignment: Degenerative straightening and reversal of the normal cervical lordosis. Skull base and vertebrae: No acute fracture. No primary bone lesion or focal pathologic process. Soft tissues and spinal canal: No prevertebral fluid or swelling. No visible canal hematoma. Disc levels: Severe disc space height loss and osteophytosis from C5 through C7 with otherwise relatively preserved disc spaces. Upper chest: Partially imaged right-sided pleural mass or fluid; please see  forthcoming dedicated examination of the chest abdomen and pelvis. Other: None. IMPRESSION: 1. No acute intracranial pathology. Small-vessel white matter disease. 2. No fracture or static subluxation of the cervical spine. 3. Severe disc space height loss and osteophytosis from C5 through C7 with otherwise relatively preserved disc spaces. 4. Partially imaged right-sided pleural mass or fluid; please see forthcoming dedicated examination of the chest abdomen and pelvis. Electronically Signed   By: Jearld Lesch M.D.   On: 06/28/2022 11:37   CT Cervical Spine Wo Contrast  Result Date: 06/17/2022 CLINICAL DATA:  Fall 1 day ago, facial trauma EXAM: CT HEAD WITHOUT CONTRAST CT CERVICAL SPINE WITHOUT CONTRAST TECHNIQUE: Multidetector CT imaging of the head and cervical spine was performed following the standard  protocol without intravenous contrast. Multiplanar CT image reconstructions of the cervical spine were also generated. RADIATION DOSE REDUCTION: This exam was performed according to the departmental dose-optimization program which includes automated exposure control, adjustment of the mA and/or kV according to patient size and/or use of iterative reconstruction technique. COMPARISON:  None Available. FINDINGS: CT HEAD FINDINGS Brain: No evidence of acute infarction, hemorrhage, hydrocephalus, extra-axial collection or mass lesion/mass effect. Periventricular and deep white matter hypodensity. Vascular: No hyperdense vessel or unexpected calcification. Skull: Normal. Negative for fracture or focal lesion. Sinuses/Orbits: No acute finding. Other: None. CT CERVICAL SPINE FINDINGS Alignment: Degenerative straightening and reversal of the normal cervical lordosis. Skull base and vertebrae: No acute fracture. No primary bone lesion or focal pathologic process. Soft tissues and spinal canal: No prevertebral fluid or swelling. No visible canal hematoma. Disc levels: Severe disc space height loss and osteophytosis from  C5 through C7 with otherwise relatively preserved disc spaces. Upper chest: Partially imaged right-sided pleural mass or fluid; please see forthcoming dedicated examination of the chest abdomen and pelvis. Other: None. IMPRESSION: 1. No acute intracranial pathology. Small-vessel white matter disease. 2. No fracture or static subluxation of the cervical spine. 3. Severe disc space height loss and osteophytosis from C5 through C7 with otherwise relatively preserved disc spaces. 4. Partially imaged right-sided pleural mass or fluid; please see forthcoming dedicated examination of the chest abdomen and pelvis. Electronically Signed   By: Jearld Lesch M.D.   On: 06/19/2022 11:37   DG Chest Portable 1 View  Result Date: 06/23/2022 CLINICAL DATA:  Sepsis. EXAM: PORTABLE CHEST 1 VIEW COMPARISON:  X-ray 05/26/2021 FINDINGS: Small right effusion with the adjacent opacities. No pneumothorax or edema. Stable cardiopericardial silhouette. Tortuous and ectatic aorta. IMPRESSION: Small right effusion with adjacent lung opacity. Acute infiltrates possible. Recommend follow-up to confirm clearance Electronically Signed   By: Karen Kays M.D.   On: 06/13/2022 10:32     PROCEDURES:  Critical Care performed: Yes, see critical care procedure note(s)  CRITICAL CARE Performed by: Sharyn Creamer   Total critical care time: 40 minutes  Critical care time was exclusive of separately billable procedures and treating other patients.  Critical care was necessary to treat or prevent imminent or life-threatening deterioration.  Critical care was time spent personally by me on the following activities: development of treatment plan with patient and/or surrogate as well as nursing, discussions with consultants, evaluation of patient's response to treatment, examination of patient, obtaining history from patient or surrogate, ordering and performing treatments and interventions, ordering and review of laboratory studies, ordering  and review of radiographic studies, pulse oximetry and re-evaluation of patient's condition.   Procedures   MEDICATIONS ORDERED IN ED: Medications  acetaminophen (OFIRMEV) IV 1,000 mg (0 mg Intravenous Stopped 06/18/2022 1154)  lactated ringers infusion (has no administration in time range)  vancomycin (VANCOREADY) IVPB 1500 mg/300 mL (1,500 mg Intravenous New Bag/Given 05/31/2022 1131)  lactated ringers bolus 250 mL (has no administration in time range)  metroNIDAZOLE (FLAGYL) IVPB 500 mg (has no administration in time range)  lactated ringers bolus 500 mL (500 mLs Intravenous New Bag/Given 06/16/2022 1035)  aztreonam (AZACTAM) 2 g in sodium chloride 0.9 % 100 mL IVPB (0 g Intravenous Stopped 06/09/2022 1133)  lactated ringers bolus 500 mL (500 mLs Intravenous New Bag/Given 06/16/2022 1057)  lactated ringers bolus 1,000 mL (1,000 mLs Intravenous New Bag/Given 06/12/2022 1146)     IMPRESSION / MDM / ASSESSMENT AND PLAN / ED COURSE  I reviewed the triage  vital signs and the nursing notes.                              Differential diagnosis includes, but is not limited to, very broad differential but includes potential injuries from the fall including causes such as intracranial hemorrhage, subdural, subarachnoid, cervical injury, chest wall injury, hematoma, hemothorax, pneumothorax etc.  But also even higher on my differential is infection, she has a fever of 102 with EMS tachypnea leukocytosis and chest x-ray findings concerning for infiltrate.  At this point as of 11:20 AM patient is at CT scan.  She has evidence of acute kidney injury and cannot undergo contrast-enhanced imaging.    Patient's presentation is most consistent with acute presentation with potential threat to life or bodily function.   The patient is on the cardiac monitor to evaluate for evidence of arrhythmia and/or significant heart rate changes.    Clinical Course as of 06/19/2022 1244  Thu Jun 24, 2022  1044 Called and spoke  with the patient's husband Mr. Manson Passey.  He is her closest family, and Management consultant.  He advised that his wife has expressed in the past that she does not wish to be resuscitated, she would not wish to have CPR, and further clarification that she would not wish to be on a ventilator or have a breathing tube.  The patient is DO NOT RESUSCITATE DO NOT INTUBATE at this time.  Patient husband is coming to the ER, and also contacting her family including her daughter and 1 son who is a ER physician in [place] [MQ]    Clinical Course User Index [MQ] Sharyn Creamer, MD   ----------------------------------------- 11:20 AM on 06/04/2022 ----------------------------------------- Labs reviewed quite markedly notable for severe leukocytosis, elevated lactate, mildly elevated troponin likely in keeping with demand, AKI with creatinine 2-1/2, appropriate potassium, elevated bilirubin with normal LFTs.  In addition CT imaging does not show acute right upper quadrant pathology, suspect this may be due to secondary process such as sepsis   ----------------------------------------- 12:35 PM on 06/23/2022 ----------------------------------------- Spoke with the patient's son Icy Fuhrmann, he is also an ER physician, updated him on the patient's condition and results to this point.  He is understanding of the need for admission as well as his stepfather Mr. Manson Passey.  Thayer Ohm also affirmed his mother's wishes to be DNR, and she is a retired Development worker, community too.  I have consulted with our intensive care unit team, patient will be admitted to the ICU. Discussed with Leanord Asal, NP   ED Sepsis - Repeat Assessment   Performed at:    1240  Last Vitals:    Blood pressure (!) 156/88, pulse (!) 148, temperature (!) 101.2 F (38.4 C), temperature source Rectal, resp. rate (!) 28, height  (1.6 m), weight 69 kg, SpO2 100 %.     Skin (include color):   Warm, well-perfused extremities   FINAL CLINICAL IMPRESSION(S) / ED DIAGNOSES    Final diagnoses:  Severe sepsis  Loculated pleural effusion  AKI (acute kidney injury)     Rx / DC Orders   ED Discharge Orders     None        Note:  This document was prepared using Dragon voice recognition software and may include unintentional dictation errors.   Sharyn Creamer, MD 06/23/2022 1254

## 2022-06-24 NOTE — ED Triage Notes (Signed)
Pt ems from home. Per ems, pt fell yesterday but was not seen by MD. Pt fell today and was not responsive when ems arrived. Pt arrived to [facility] on cpap, hr 153 ST. Pt is responding some and following some commands.

## 2022-06-24 NOTE — Progress Notes (Signed)
Elink following sepsis bundle. °

## 2022-06-24 NOTE — Progress Notes (Signed)
PHARMACY - PHYSICIAN COMMUNICATION CRITICAL VALUE ALERT - BLOOD CULTURE IDENTIFICATION (BCID)  Sherry Mosley is an 77 y.o. female who presented to [facility] on 06/24/2022 with a chief complaint of altered mental status and acute respiratory failure   Assessment:  3/4 bottles. GPR. Sending to [facility] for cultures. Poss PNA source   Name of physician (or Provider) Contacted: Britton-Lee   Current antibiotics: Vancomycin, aztreonam,   Changes to prescribed antibiotics recommended:  Patient is on recommended antibiotics - No changes needed Rec adding empiric anaerobic coverage with flagyl given loculated pleural effusion   No results found for this or any previous visit.  Sharen Hones, PharmD, BCPS Clinical Pharmacist   06/24/2022  9:19 PM

## 2022-06-24 NOTE — Progress Notes (Signed)
CT Standby

## 2022-06-24 NOTE — Telephone Encounter (Signed)
Tried calling husband again no answer. I left detailed message for patient.

## 2022-06-24 NOTE — Progress Notes (Signed)
PHARMACY -  BRIEF ANTIBIOTIC NOTE   Pharmacy has received consult(s) for aztreonam, vancomycin from an ED provider.  The patient's profile has been reviewed for ht/wt/allergies/indication/available labs.    One time order(s) placed for aztreonam 2 gm IV and vancomycin 1500 mg IV  Further antibiotics/pharmacy consults should be ordered by admitting physician if indicated.                       Thank you, Manfred Shirts 06/24/2022  10:33 AM

## 2022-06-24 NOTE — H&P (Addendum)
NAME:  Sherry Mosley, MRN:  [id number], DOB:  11/11/1945, LOS: 0 ADMISSION DATE:  06/09/2022, CONSULTATION DATE:  06/10/2022 REFERRING MD:  Dr. Fanny Bien, CHIEF COMPLAINT:  Altered Mental Status   Brief Pt Description / Synopsis:  77 y.o. female with PMHx significant for Non Hodgkin's Lymphoma, who is admitted with Acute Metabolic Encephalopathy and Acute Hypoxic Respiratory Failure due to Severe Sepsis from Right sided Pneumonia and loculated right sided pleural effusion, along with Acute Kidney Injury and Anion Gap metabolic acidosis.  History of Present Illness:  Sherry Mosley is a 77 year old female with a past medical history significant for non-Hodgkin's lymphoma, hypertension, hyperlipidemia who presents to [facility] ED on 06/17/2022 due to altered mental status.  Patient is currently altered and unable to contribute to history, therefore history is obtained from patient's husband at bedside along with chart review.  Per the patient's husband, she was seen a couple days ago by her ophthalmologist and diagnosed with conjunctivitis and was prescribed antibiotic drops.  He also reports that she was complaining of soreness with her right ribs, however she did not want to seek medical care at that time.  He reports she has had increasing fatigue and weakness along with multiple falls.  The night before last she fell onto her buttocks, did not hit her head, and he was able to get her back to bed.  This morning she was extremely confused prompting him to contact EMS for further medical evaluation.  ED Course: Initial Vital Signs: Pulse 151, respiratory rate 36, blood pressure 144/84, SpO2 100% on BiPAP, temperature 101.2 F rectally Significant Labs: Sodium 133, bicarb 17, anion gap 16, BUN 52, creatinine 2.25, glucose 218, alkaline phosphatase 197, total bilirubin 3.9, albumin 2.2, high-sensitivity troponin 49, lactic acid 4.5 (later trended down to 2.0), procalcitonin 4, WBC 25.3 with neutrophilia, hemoglobin  11.9, hematocrit 34.4 VBG: pH 7.43/pCO2 28/pO2 less than 31/bicarb 18.6 COVID-19 PCR is negative Urinalysis is negative for UTI EKG: EKG time: 1010 heart rate 150 QRS 80 QTc 460 Sinus tachycardia.  Nonspecific ST abnormality, no frank ischemia.  Possible rate related.  Cannot rule out ischemia Imaging: Chest X-ray>>IMPRESSION: Small right effusion with adjacent lung opacity. Acute infiltrates possible. Recommend follow-up to confirm clearance CT chest/abdomen/pelvis>>IMPRESSION: 1. Examination is generally limited by breath motion artifact and lack of intravenous contrast. 2. Moderate, loculated right pleural effusion with extensive associated atelectasis or consolidation. No overlying rib fracture. 3. Large contusion and hematoma of the left buttock. 4. No significant change in retroperitoneal, iliac, inguinal, and mesenteric lymphadenopathy, as well as splenomegaly, in keeping with history of lymphoma. 5. Hepatomegaly. Aortic Atherosclerosis CT Head/Cervical Spine>>IMPRESSION: 1. No acute intracranial pathology. Small-vessel white matter disease. 2. No fracture or static subluxation of the cervical spine. 3. Severe disc space height loss and osteophytosis from C5 through C7 with otherwise relatively preserved disc spaces. 4. Partially imaged right-sided pleural mass or fluid; please see forthcoming dedicated examination of the chest abdomen and pelvis. Medications Administered: 1.725 L of LR boluses, IV aztreonam, Flagyl, and vancomycin  She met sepsis criteria therefore she was given IV fluid resuscitation along with broad-spectrum antibiotics.  Blood cultures were drawn.  Patient's husband at bedside and her son who is an ED physician in [place] both verify that patient is to be a DNR/DNI given her previous wishes.  PCCM is asked to admit for further workup and treatment.  Please see "significant hospital events" section below for full detailed hospital  course.    Pertinent  Medical History  Past Medical History:  Diagnosis Date   Generalized osteoarthritis    H/O benign breast biopsy 1964   H/O bilateral cataract extraction 2005   Hyperlipidemia    Hypertension    Mood disorder    Non Hodgkin's lymphoma    Pancreatitis 10/20/2012   unknown etiology   Postmenopausal    S/P ankle arthrodesis 2012   Right   Sleep disorder    Uterine fibroid      Micro Data:  4/25: SARS-CoV-2 PCR>> negative 4/25: Respiratory viral panel>> 4/25: Blood culture x 2>> 4/25: Strep pneumo urinary antigen>> 4/25: Legionella urine antigen>> 4/25: Pleural fluid>>  Antimicrobials:   Anti-infectives (From admission, onward)    Start     Dose/Rate Route Frequency Ordered Stop   06/26/2022 2200  aztreonam (AZACTAM) 1 g in sodium chloride 0.9 % 100 mL IVPB        1 g 200 mL/hr over 30 Minutes Intravenous Every 8 hours 06/08/2022 1302     06/09/2022 1257  vancomycin variable dose per unstable renal function (pharmacist dosing)         Does not apply See admin instructions 06/03/2022 1302     06/01/2022 1245  metroNIDAZOLE (FLAGYL) IVPB 500 mg        500 mg 100 mL/hr over 60 Minutes Intravenous  Once 05/31/2022 1242     06/13/2022 1100  vancomycin (VANCOREADY) IVPB 1500 mg/300 mL        1,500 mg 150 mL/hr over 120 Minutes Intravenous  Once 06/28/2022 1032 06/26/2022 1331   06/06/2022 1030  aztreonam (AZACTAM) 2 g in sodium chloride 0.9 % 100 mL IVPB        2 g 200 mL/hr over 30 Minutes Intravenous  Once 06/25/2022 1021 06/28/2022 1133   06/06/2022 1030  vancomycin (VANCOCIN) IVPB 1000 mg/200 mL premix  Status:  Discontinued        1,000 mg 200 mL/hr over 60 Minutes Intravenous  Once 06/22/2022 1021 06/12/2022 1032        Significant Hospital Events: Including procedures, antibiotic start and stop dates in addition to other pertinent events   4/25: Presented to ED with altered mental status and acute respiratory failure requiring BiPAP.  Patient is DNR/DNI per her  previous wishes.  Critically ill with multiorgan failure, PCCM asked to admit.  IR consulted for evaluation for chest tube placement due to loculated right-sided pleural effusion.  Interim History / Subjective:  -Patient seen and evaluated in the ED -Patient is somnolent, arousing only to painful stimuli and sternal rub -Currently on BiPAP with increased respiratory rate and work of breathing ~confirmed with patient's husband at bedside that she is DNR/DNI -Currently maintaining blood pressure -Will consult IR to evaluate for chest tube placement for right loculated pleural effusion  Objective   Blood pressure (!) 156/88, pulse (!) 148, temperature (!) 101.2 F (38.4 C), temperature source Rectal, resp. rate (!) 28, height 5\' 3"  (1.6 m), weight 69 kg, SpO2 100 %.    FiO2 (%):  [35 %] 35 %  No intake or output data in the 24 hours ending 06/14/2022 1247 Filed Weights   06/20/2022 1046  Weight: 69 kg    Examination: General: Critically ill-appearing female, laying in bed, on BiPAP, with moderate respiratory distress HENT: Atraumatic, normocephalic, neck supple, difficult to assess JVD due to BiPAP, conjunctivitis to right eye (see images below) Lungs: Coarse breath sounds bilaterally, no wheezing or rales noted, BiPAP assisted, tachypnea with increased work of breathing, even Cardiovascular: Tachycardia, regular rhythm,  S1-S2, no murmurs, rubs, gallops Abdomen: Obese, soft, nontender, nondistended, no guarding or rebound tenderness, bowel sounds positive x 4 Extremities: Normal bulk and tone, no deformities, no edema Neuro: Somnolent, withdraws from painful stimuli, unable to assess orientation, pupils PERRLA GU: Deferred Skin: Scattered ecchymosis with large bruising to right buttock/hip (see images below)          Resolved Hospital Problem list     Assessment & Plan:   #Acute Hypoxic Respiratory Failure in the setting of Pneumonia & Right sided Loculated Pleural  Effusion -Supplemental O2 as needed to maintain O2 sats >92% -BiPAP, wean as tolerated (pt is DNR/DNI) -Follow intermittent Chest X-ray & ABG as needed -Bronchodilators  -ABX as above -Consult IR to assess for chest tube placement -Pulmonary toilet as able  #Severe Sepsis in setting of Pneumonia (Meets SIRS criteria at admission: Temp. 101.2, RR 37, HR 153, WBC 25.3, Lactic 4.5) #Right eye Conjunctivitis (present on admission, was being treat outpatient) -Monitor fever curve -Trend WBC's & Procalcitonin -Follow cultures as above -Continue empiric Aztreonam & Vancomycin pending cultures & sensitivities  #Mildly Elevated Troponin, suspect demand ischemia #Tachycardia due to Sepsis -Continuous cardiac monitoring -Maintain MAP >65 -IV fluids -Vasopressors as needed to maintain MAP goal ~ currently not requiring -Trend lactic acid until normalized -Trend HS Troponin until peaked  #Acute Kidney Injury #AG Metabolic Acidosis due to Lactic Acidosis and AKI #Mild Hyponatremia -Monitor I&O's / urinary output -Follow BMP -Ensure adequate renal perfusion -Avoid nephrotoxic agents as able -Replace electrolytes as indicated -IV fluids -Start Bicarb gtt  #Elevated LFT's CT Abdomen with hepatomegaly.  NO gallstones, gallbladder thickening, or biliary dilatation.  -Trend LFTs  & coags -Check RUQ Abdominal US  #Normocytic Normochromic Anemia without s/sx of overt blood loss -Monitor for S/Sx of bleeding -Trend CBC -Heparin SQ for VTE Prophylaxis  -Transfuse for Hgb <7  #Hyperglycemia, likely stress induced from critical illness -CBG's q4h; Target range of 140 to 180 -SSI -Follow ICU Hypo/Hyperglycemia protocol -Check Hgb A1c  #Acute Metabolic Encephalopathy in setting of Severe Sepsis, AKI, and metabolic acidosis CT Head negative for acute intracranial abnormality -Treatment of sepsis and metabolic derangements as outlined above -Provide supportive care -Promote normal  sleep/wake cycle and family presence -Avoid sedating meds as able -Check UDS -Consider EEG & MRI if no improvement in mental status with correction of above     Patient is critically ill with multiorgan failure.  Prognosis is guarded, high risk for further decompensation, cardiac arrest and death.     Best Practice (right click and "Reselect all SmartList Selections" daily)   Diet/type: NPO DVT prophylaxis: prophylactic heparin  GI prophylaxis: N/A Lines: N/A Foley:  N/A Code Status:  DNR Last date of multidisciplinary goals of care discussion [N/A]  4/25: Pt's husband and sister-in-law updated at bedside.  Pt's son who is an ER physician was updated via telephone by Dr. Belia Heman.  Labs   CBC: Recent Labs  Lab 06/12/2022 1009  WBC 25.3*  NEUTROABS 21.4*  HGB 11.9*  HCT 34.4*  MCV 90.1  PLT 232    Basic Metabolic Panel: Recent Labs  Lab 06/10/2022 1009  NA 133*  K 4.7  CL 100  CO2 17*  GLUCOSE 218*  BUN 52*  CREATININE 2.25*  CALCIUM 9.1   GFR: Estimated Creatinine Clearance: 19.2 mL/min (A) (by C-G formula based on SCr of 2.25 mg/dL (H)). Recent Labs  Lab 06/11/2022 1009  WBC 25.3*  LATICACIDVEN 4.5*    Liver Function Tests: Recent Labs  Lab 06/07/2022 1009  AST 40  ALT 28  ALKPHOS 197*  BILITOT 3.9*  PROT 7.2  ALBUMIN 2.2*   No results for input(s): "LIPASE", "AMYLASE" in the last 168 hours. Recent Labs  Lab 06/13/2022 1009  AMMONIA 11    ABG    Component Value Date/Time   HCO3 18.6 (L) 06/27/2022 1016   ACIDBASEDEF 4.4 (H) 06/12/2022 1016   O2SAT 49 06/02/2022 1016     Coagulation Profile: Recent Labs  Lab 06/21/2022 1009  INR 1.3*    Cardiac Enzymes: Recent Labs  Lab 06/09/2022 1009  CKTOTAL 111    HbA1C: No results found for: "HGBA1C"  CBG: Recent Labs  Lab 06/17/2022 1011  GLUCAP 197*    Review of Systems:   Unable to assess due to AMS   Past Medical History:  She,  has a past medical history of Generalized  osteoarthritis, H/O benign breast biopsy (1964), H/O bilateral cataract extraction (2005), Hyperlipidemia, Hypertension, Mood disorder, Non Hodgkin's lymphoma, Pancreatitis (10/20/2012), Postmenopausal, S/P ankle arthrodesis (2012), Sleep disorder, and Uterine fibroid.   Surgical History:   Past Surgical History:  Procedure Laterality Date   ABDOMINAL HYSTERECTOMY  1997   ANKLE ARTHRODESIS Right 2012   BREAST BIOPSY Left 1969   fibroadenoma   CATARACT EXTRACTION W/ INTRAOCULAR LENS IMPLANT Bilateral    LYMPH NODE BIOPSY  2014   excisional biopsy right groin   Thumb surgery     removal of part of extensor tendon     Social History:   reports that she has never smoked. She has never used smokeless tobacco. She reports current alcohol use. She reports that she does not use drugs.   Family History:  Her family history includes Alzheimer's disease in her mother; Diabetes in her maternal grandfather; Heart failure in her father; Prostate cancer in her father. There is no history of Breast cancer.   Allergies Allergies  Allergen Reactions   Cephalosporins     Rash   Penicillins     Significant Rash     Home Medications  Prior to Admission medications   Medication Sig Start Date End Date Taking? Authorizing Provider  prednisoLONE acetate (PRED FORTE) 1 % ophthalmic suspension Place 1 drop into both eyes every 4 (four) hours. 06/23/22  Yes [provider]  Ascorbic Acid (VITAMIN C) 1000 MG tablet Take 1 tablet by mouth as directed. Takes off and on , sporadically    [provider]  Cholecalciferol (VITAMIN D3) 50 MCG (2000 UT) capsule Take 1 capsule by mouth as directed. Takes off and of, sporadically    [provider]  ibrutinib (IMBRUVICA) 140 MG capsule Take 3 capsules (420 mg total) by mouth daily. Take with a glass of water. 03/10/22   Johney Maine, MD  lisinopril (ZESTRIL) 5 MG tablet TAKE 1 TABLET EVERY DAY 05/14/22   Sharon Seller, NP   Loratadine 10 MG CAPS Take 1 tablet by mouth daily.    [provider]  simvastatin (ZOCOR) 20 MG tablet TAKE 1 TABLET EVERY DAY 05/14/22   Sharon Seller, NP  temazepam (RESTORIL) 15 MG capsule Take one capsule by mouth every night at bedtime as needed for sleep. 02/25/22   Sharon Seller, NP  vitamin E 180 MG (400 UNITS) capsule Take 400 Units by mouth as directed. Takes off and on, sporadically    [provider]     Critical care time: 55 minutes     Harlon Ditty, AGACNP-BC [facility] Prefer epic messenger for cross cover needs If after hours, please call E-link  ICU ATTENDING ATTESTATION:  Patient seen and examined and relevant ancillary tests reviewed.   I agree with the assessment and plan of care as outlined by Harlon Ditty NP.     REVIEW OF SYSTEMS  PATIENT IS UNABLE TO PROVIDE COMPLETE REVIEW OF SYSTEMS DUE TO SEVERE CRITICAL ILLNESS      BP 136/82   Pulse (!) 110   Temp 98.7 F (37.1 C) (Axillary)   Resp (!) 30   Ht  (1.6 m)   Wt 67.7 kg   SpO2 96%   BMI 26.44 kg/m   CBC    Component Value Date/Time   WBC 25.3 (H) 06/19/2022 1009   RBC 3.82 (L) 06/08/2022 1009   HGB 11.9 (L) 06/08/2022 1009   HGB 15.1 (H) 05/07/2022 1100   HCT 34.4 (L) 06/28/2022 1009   PLT 232 06/12/2022 1009   PLT 115 (L) 05/07/2022 1100   MCV 90.1 06/17/2022 1009   MCH 31.2 06/26/2022 1009   MCHC 34.6 06/21/2022 1009   RDW 13.6 05/31/2022 1009   LYMPHSABS 1.2 06/12/2022 1009   MONOABS 0.9 06/21/2022 1009   EOSABS 0.0 06/03/2022 1009   BASOSABS 0.1 06/23/2022 1009        Latest Ref Rng & Units 06/23/2022   10:09 AM 05/07/2022   11:00 AM 04/12/2022    1:54 PM  BMP  Glucose 70 - 99 mg/dL 161  96  88   BUN 8 - 23 mg/dL 52  23  18   Creatinine 0.44 - 1.00 mg/dL 0.96  0.45  4.09   Sodium 135 - 145 mmol/L 133  139  137   Potassium 3.5 - 5.1 mmol/L 4.7  4.3  4.0   Chloride 98 - 111 mmol/L 100  103  100   CO2 22 - 32  mmol/L Calcium 8.9 - 10.3 mg/dL 9.1  9.4  8.6          Critical Care Time devoted to patient care services described in this note is 75 Total Care Time minutes.   Overall, patient is critically ill, prognosis is guarded.  Patient with Multiorgan failure and at high risk for cardiac arrest and death.    Lucie Leather, M.D.  [facility]  Medical Director [facility] Medical Director [facility] Cardio-Pulmonary Department

## 2022-06-24 NOTE — Code Documentation (Signed)
CODE SEPSIS - PHARMACY COMMUNICATION  **Broad Spectrum Antibiotics should be administered within 1 hour of Sepsis diagnosis**  Time Code Sepsis Called/Page Received: 1027  Antibiotics Ordered: aztreonam, vancomycin  Time of 1st antibiotic administration: 1056  Additional action taken by pharmacy: N/A    Manfred Shirts ,PharmD Clinical Pharmacist  06/24/2022  11:14 AM

## 2022-06-24 NOTE — Consult Note (Signed)
Chief Complaint: Patient was seen in consultation today for right loculated pleural effusion at the request of Erin Fulling, MD   Referring Physician(s): Erin Fulling, MD   Supervising Physician: Gilmer Mor  Patient Status: [facility] - In-pt  History of Present Illness: Sherry Mosley is a 77 y.o. female with PMHx significant for HTN, hyperlipidemia, Non-hodgkin's lymphoma, pancreatitis with recent right eye conjunctivitis she has been on antibiotic drops. History is obtained from chart review and form husband today as patient is not able to provide given confusion. Patient presented to ED today after husband noticed acute mental status change today, the patient has been feeling weakness and fatigue with episodes of falling per husband but today she was extremely confused. Patient met sepsis criteria and is being admitted with IV treatment of fluids and antibiotics. CT done with loculated right pleural effusion and critical care has requested IR consult.   Past Medical History:  Diagnosis Date   Generalized osteoarthritis    H/O benign breast biopsy 1964   H/O bilateral cataract extraction 2005   Hyperlipidemia    Hypertension    Mood disorder    Non Hodgkin's lymphoma    Pancreatitis 10/20/2012   unknown etiology   Postmenopausal    S/P ankle arthrodesis 2012   Right   Sleep disorder    Uterine fibroid     Past Surgical History:  Procedure Laterality Date   ABDOMINAL HYSTERECTOMY  1997   ANKLE ARTHRODESIS Right 2012   BREAST BIOPSY Left 1969   fibroadenoma   CATARACT EXTRACTION W/ INTRAOCULAR LENS IMPLANT Bilateral    LYMPH NODE BIOPSY  2014   excisional biopsy right groin   Thumb surgery     removal of part of extensor tendon    Allergies: Cephalosporins and Penicillins  Medications: Prior to Admission medications   Medication Sig Start Date End Date Taking? Authorizing Provider  Ascorbic Acid (VITAMIN C) 1000 MG tablet Take 1 tablet by mouth as directed. Takes  off and on , sporadically   Yes [provider]  Cholecalciferol (VITAMIN D3) 50 MCG (2000 UT) capsule Take 1 capsule by mouth as directed. Takes off and of, sporadically   Yes [provider]  ibrutinib (IMBRUVICA) 140 MG capsule Take 3 capsules (420 mg total) by mouth daily. Take with a glass of water. 03/10/22  Yes Johney Maine, MD  lisinopril (ZESTRIL) 5 MG tablet TAKE 1 TABLET EVERY DAY 05/14/22  Yes Sharon Seller, NP  Loratadine 10 MG CAPS Take 1 tablet by mouth daily.   Yes [provider]  prednisoLONE acetate (PRED FORTE) 1 % ophthalmic suspension Place 1 drop into both eyes every 4 (four) hours. 06/23/22  Yes [provider]  simvastatin (ZOCOR) 20 MG tablet TAKE 1 TABLET EVERY DAY 05/14/22  Yes Sharon Seller, NP  temazepam (RESTORIL) 15 MG capsule Take one capsule by mouth every night at bedtime as needed for sleep. 02/25/22  Yes Sharon Seller, NP  vitamin E 180 MG (400 UNITS) capsule Take 400 Units by mouth as directed. 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 Son is alternate   Would accept resuscitation   No tube feeds if cognitively unaware   Social Determinants of Health   Financial Resource Strain: Not on file  Food Insecurity: No Food Insecurity (06/17/2022)   Hunger Vital Sign    Worried About Running Out of Food in the Last Year: Never true    Ran Out of Food in the Last Year: Never true  Transportation Needs: No Transportation Needs (06/01/2022)   PRAPARE - Administrator, Civil Service (Medical): No    Lack of Transportation (Non-Medical): No  Physical Activity: Not on file  Stress: Not on file  Social Connections: Not on file    Review of Systems: unable to provide secondary to confusion and lethargy   Vital Signs: BP 130/78 (BP Location: Left Arm)   Pulse (!) 106   Temp 98.7 F (37.1 C) (Axillary)   Resp (!) 32   Ht 5\' 3"  (1.6 m)   Wt 149 lb 4 oz (67.7 kg)   SpO2 97%   BMI 26.44 kg/m   Physical Exam General: Critically ill, On BiPap with family bedside Eyes: Right eye conjunctivitis present Resp: some respiratory distress noted on BiPAP  Imaging: CT CHEST ABDOMEN PELVIS WO CONTRAST  Result Date: 06/23/2022 CLINICAL DATA:  Fall 1 day ago, bruising to right ribs, fever and tachycardia history of marginal zone lymphoma * Tracking Code: BO * EXAM: CT CHEST, ABDOMEN AND PELVIS WITHOUT CONTRAST TECHNIQUE: Multidetector CT imaging of the chest, abdomen and pelvis was performed following the standard protocol without IV contrast. RADIATION DOSE REDUCTION: This exam was performed according to the departmental dose-optimization program which includes automated exposure control, adjustment of the mA and/or kV according to patient size and/or use of iterative reconstruction technique. COMPARISON:  CT chest abdomen pelvis, 12/11/2021 FINDINGS: Examination is generally limited by breath motion artifact and lack of intravenous contrast. CT CHEST FINDINGS Cardiovascular:  Aortic atherosclerosis. Normal heart size. No pericardial effusion. Mediastinum/Nodes: No enlarged mediastinal, hilar, or axillary lymph nodes. Thyroid gland, trachea, and esophagus demonstrate no significant findings. Lungs/Pleura: Moderate, loculated right pleural effusion with extensive associated atelectasis or consolidation. Bandlike scarring of the left lung base. Musculoskeletal: No chest wall abnormality. No acute osseous findings. CT ABDOMEN PELVIS FINDINGS Hepatobiliary: No solid liver abnormality is seen. Hepatomegaly, maximum coronal span 20.5 cm. No gallstones, gallbladder wall thickening, or biliary dilatation. Pancreas: Unremarkable. No pancreatic ductal dilatation or surrounding inflammatory changes. Spleen: Splenomegaly, maximum coronal span 14.2 cm. Adrenals/Urinary Tract: Adrenal glands are unremarkable. Kidneys are otherwise normal, without renal calculi, solid lesion, or hydronephrosis. Bladder is unremarkable. Stomach/Bowel: Stomach is within normal limits. Appendix appears normal. No evidence of bowel wall thickening, distention, or inflammatory changes. Vascular/Lymphatic: Aortic atherosclerosis. No significant  change in retroperitoneal, iliac, inguinal, and mesenteric lymphadenopathy. Reproductive: Status post hysterectomy. Other: No abdominal hernia. Large contusion and hematoma of the left buttock (series 2, image 96). No ascites. Musculoskeletal: No acute osseous findings. IMPRESSION: 1. Examination is generally limited by breath motion artifact and lack of intravenous contrast. 2. Moderate, loculated right pleural effusion with extensive associated atelectasis or consolidation. No overlying rib fracture. 3. Large contusion and hematoma of the left buttock. 4. No significant change in retroperitoneal, iliac, inguinal, and mesenteric lymphadenopathy, as well as splenomegaly, in keeping with history of lymphoma. 5. Hepatomegaly. Aortic Atherosclerosis (ICD10-I70.0). Electronically Signed    By: Jearld Lesch M.D.   On: 06/16/2022 11:53   CT Head Wo Contrast  Result Date: 06/01/2022 CLINICAL DATA:  Fall 1 day ago, facial trauma EXAM: CT HEAD WITHOUT CONTRAST CT CERVICAL SPINE WITHOUT CONTRAST TECHNIQUE: Multidetector CT imaging of the head and cervical spine was performed following the standard protocol without intravenous contrast. Multiplanar CT image reconstructions of the cervical spine were also generated. RADIATION DOSE REDUCTION: This exam was performed according to the departmental dose-optimization program which includes automated exposure control, adjustment of the mA and/or kV according to patient size and/or use of iterative reconstruction technique. COMPARISON:  None Available. FINDINGS: CT HEAD FINDINGS Brain: No evidence of acute infarction, hemorrhage, hydrocephalus, extra-axial collection or mass lesion/mass effect. Periventricular and deep white matter hypodensity. Vascular: No hyperdense vessel or unexpected calcification. Skull: Normal. Negative for fracture or focal lesion. Sinuses/Orbits: No acute finding. Other: None. CT CERVICAL SPINE FINDINGS Alignment: Degenerative straightening and reversal of the normal cervical lordosis. Skull base and vertebrae: No acute fracture. No primary bone lesion or focal pathologic process. Soft tissues and spinal canal: No prevertebral fluid or swelling. No visible canal hematoma. Disc levels: Severe disc space height loss and osteophytosis from C5 through C7 with otherwise relatively preserved disc spaces. Upper chest: Partially imaged right-sided pleural mass or fluid; please see forthcoming dedicated examination of the chest abdomen and pelvis. Other: None. IMPRESSION: 1. No acute intracranial pathology. Small-vessel white matter disease. 2. No fracture or static subluxation of the cervical spine. 3. Severe disc space height loss and osteophytosis from C5 through C7 with otherwise relatively preserved disc spaces. 4. Partially imaged  right-sided pleural mass or fluid; please see forthcoming dedicated examination of the chest abdomen and pelvis. Electronically Signed   By: Jearld Lesch M.D.   On: 06/21/2022 11:37   CT Cervical Spine Wo Contrast  Result Date: 06/07/2022 CLINICAL DATA:  Fall 1 day ago, facial trauma EXAM: CT HEAD WITHOUT CONTRAST CT CERVICAL SPINE WITHOUT CONTRAST TECHNIQUE: Multidetector CT imaging of the head and cervical spine was performed following the standard protocol without intravenous contrast. Multiplanar CT image reconstructions of the cervical spine were also generated. RADIATION DOSE REDUCTION: This exam was performed according to the departmental dose-optimization program which includes automated exposure control, adjustment of the mA and/or kV according to patient size and/or use of iterative reconstruction technique. COMPARISON:  None Available. FINDINGS: CT HEAD FINDINGS Brain: No evidence of acute infarction, hemorrhage, hydrocephalus, extra-axial collection or mass lesion/mass effect. Periventricular and deep white matter hypodensity. Vascular: No hyperdense vessel or unexpected calcification. Skull: Normal. Negative for fracture or focal lesion. Sinuses/Orbits: No acute finding. Other: None. CT CERVICAL SPINE FINDINGS Alignment: Degenerative straightening and reversal of the normal cervical lordosis. Skull base and vertebrae: No acute fracture. No primary bone lesion or focal pathologic process. Soft tissues and spinal canal: No prevertebral fluid or swelling. No visible canal hematoma.  Disc levels: Severe disc space height loss and osteophytosis from C5 through C7 with otherwise relatively preserved disc spaces. Upper chest: Partially imaged right-sided pleural mass or fluid; please see forthcoming dedicated examination of the chest abdomen and pelvis. Other: None. IMPRESSION: 1. No acute intracranial pathology. Small-vessel white matter disease. 2. No fracture or static subluxation of the cervical spine.  3. Severe disc space height loss and osteophytosis from C5 through C7 with otherwise relatively preserved disc spaces. 4. Partially imaged right-sided pleural mass or fluid; please see forthcoming dedicated examination of the chest abdomen and pelvis. Electronically Signed   By: Jearld Lesch M.D.   On: 06/28/2022 11:37   DG Chest Portable 1 View  Result Date: 06/17/2022 CLINICAL DATA:  Sepsis. EXAM: PORTABLE CHEST 1 VIEW COMPARISON:  X-ray 05/26/2021 FINDINGS: Small right effusion with the adjacent opacities. No pneumothorax or edema. Stable cardiopericardial silhouette. Tortuous and ectatic aorta. IMPRESSION: Small right effusion with adjacent lung opacity. Acute infiltrates possible. Recommend follow-up to confirm clearance Electronically Signed   By: Karen Kays M.D.   On: 06/13/2022 10:32    Labs:  CBC: Recent Labs    01/18/22 1045 04/12/22 1354 05/07/22 1100 06/10/2022 1009  WBC 4.5 4.7 6.5 25.3*  HGB 15.0 14.2 15.1* 11.9*  HCT 45.3 42.2 43.9 34.4*  PLT 99* 101* 115* 232    COAGS: Recent Labs    06/12/2022 1009  INR 1.3*    BMP: Recent Labs    01/18/22 1045 04/12/22 1354 05/07/22 1100 06/11/2022 1009  NA 141 137 139 133*  K 4.3 4.0 4.3 4.7  CL 105 100 103 100  CO2 33* 26 31 17*  GLUCOSE 74 88 96 218*  BUN 52*  CALCIUM 9.8 8.6* 9.4 9.1  CREATININE 0.88 0.87 0.93 2.25*  GFRNONAA >60 >60 >60 22*    LIVER FUNCTION TESTS: Recent Labs    01/18/22 1045 04/12/22 1354 05/07/22 1100 05/31/2022 1009  BILITOT 0.9 0.6 0.8 3.9*  AST 13* 51* 13* 40  ALT 13 67* 13 28  ALKPHOS 49 85 49 197*  PROT 7.5 7.9 7.7 7.2  ALBUMIN 4.0 3.6 3.7 2.2*    Assessment and Plan: This is a 77 year old female with PMHx significant for HTN, hyperlipidemia, Non-hodgkin's lymphoma, pancreatitis with recent right eye conjunctivitis.   Patient presented to ED today after husband noticed acute mental status change today, sepsis criteria is met and patient is being admitted with IV  treatment of fluids and antibiotics. CT done with loculated right pleural effusion and critical care has requested IR consult.   The patient has been NPO per the husband since small amount of milk this morning, no blood thinners taken, imaging, labs and vitals have been reviewed.  Risks and benefits of chest tube placement were discussed with the patient's husband bedside including bleeding, infection, damage to adjacent structures.  All of the patient's husbands questions were answered, he is agreeable to proceed. Consent signed and in chart.   Thank you for this interesting consult.  I greatly enjoyed meeting Kalen Neidert and look forward to participating in their care.  A copy of this report was sent to the requesting provider on this date.  Electronically Signed: Berneta Levins, PA-C 06/17/2022, 3:57 PM   I spent a total of 20 Minutes in face to face in clinical consultation, greater than 50% of which was counseling/coordinating care for right loculated pleural effusion.

## 2022-06-24 NOTE — Procedures (Addendum)
Interventional Radiology Procedure Note  Procedure: Image guided right chest tube placement, complex pleural fluid.  45F pigtail drain.  Complications: None  EBL: None Sample: Culture sent  Recommendations: - Routine chest tube care - follow up Cx - routine wound care - OK for [facility] as needed  Signed,  Yvone Neu. Loreta Ave, DO

## 2022-06-24 NOTE — Progress Notes (Signed)
PHARMACY CONSULT NOTE - FOLLOW UP  Pharmacy Consult for Electrolyte Monitoring and Replacement   Recent Labs: Potassium (mmol/L)  Date Value  06/24/2022 4.7   Calcium (mg/dL)  Date Value  16/11/9602 9.1   Albumin (g/dL)  Date Value  54/10/8117 2.2 (L)   Sodium (mmol/L)  Date Value  06/24/2022 133 (L)     Assessment: Potassium, calcium wnl.  Goal of Therapy:  Electrolytes wnl.  Plan:  No replacement warranted at this time. Follow up with 4/26 AM labs.  Manfred Shirts ,PharmD Clinical Pharmacist 06/24/2022 1:31 PM

## 2022-06-24 NOTE — Progress Notes (Signed)
Pharmacy Antibiotic Note  Sherry Mosley is a 77 y.o. female admitted on 06/24/2022 with pneumonia.  Pharmacy has been consulted for aztreonam and vancomycin dosing.  Plan: Pt currently with AKI, Scr 2.25.  Baseline appears to be ~1.    Vancomycin 1500 mg IV x 1 in ED.  Will use variable dosing d/t to current renal function.  Aztreonam 2 gm IV x 1 in ED.  Will continue with aztreonam 1 gm IV Q8H.  Height:  (160 cm) Weight: 69 kg (152 lb 1.9 oz) IBW/kg (Calculated) : 52.4  Temp (24hrs), Avg:101.2 F (38.4 C), Min:101.2 F (38.4 C), Max:101.2 F (38.4 C)  Recent Labs  Lab 06/24/22 1009  WBC 25.3*  CREATININE 2.25*  LATICACIDVEN 4.5*    Estimated Creatinine Clearance: 19.2 mL/min (A) (by C-G formula based on SCr of 2.25 mg/dL (H)).    Allergies  Allergen Reactions   Cephalosporins     Rash   Penicillins     Significant Rash    Antimicrobials this admission: 4/25 vancomycin >>  4/25 aztreonam >>   Dose adjustments this admission: N/A  Microbiology results: 4/25 BCx: pending 4/25 MRSA PCR: ordered  Thank you for allowing pharmacy to be a part of this patient's care.  Manfred Shirts 06/24/2022 12:48 PM

## 2022-06-24 NOTE — Progress Notes (Signed)
Patient cancelled appointment.

## 2022-06-25 ENCOUNTER — Inpatient Hospital Stay: Payer: Medicare PPO

## 2022-06-25 ENCOUNTER — Inpatient Hospital Stay
Admit: 2022-06-25 | Discharge: 2022-06-25 | Disposition: A | Discharge status: Home / Self Care | Payer: Medicare PPO | Attending: Infectious Diseases | Admitting: Infectious Diseases

## 2022-06-25 DIAGNOSIS — J13 Pneumonia due to Streptococcus pneumoniae: Secondary | ICD-10-CM | POA: Diagnosis not present

## 2022-06-25 DIAGNOSIS — R6520 Severe sepsis without septic shock: Secondary | ICD-10-CM | POA: Diagnosis not present

## 2022-06-25 DIAGNOSIS — G9340 Encephalopathy, unspecified: Secondary | ICD-10-CM

## 2022-06-25 DIAGNOSIS — H1031 Unspecified acute conjunctivitis, right eye: Secondary | ICD-10-CM

## 2022-06-25 DIAGNOSIS — A419 Sepsis, unspecified organism: Secondary | ICD-10-CM | POA: Diagnosis not present

## 2022-06-25 LAB — CBC
HCT: 27.7 % — ABNORMAL LOW (ref 36.0–46.0)
Hemoglobin: 9.5 g/dL — ABNORMAL LOW (ref 12.0–15.0)
MCH: 30.9 pg (ref 26.0–34.0)
MCHC: 34.3 g/dL (ref 30.0–36.0)
MCV: 90.2 fL (ref 80.0–100.0)
Platelets: 175 10*3/uL (ref 150–400)
RBC: 3.07 MIL/uL — ABNORMAL LOW (ref 3.87–5.11)
RDW: 13.8 % (ref 11.5–15.5)
WBC: 20.1 10*3/uL — ABNORMAL HIGH (ref 4.0–10.5)
nRBC: 0 % (ref 0.0–0.2)

## 2022-06-25 LAB — BLOOD CULTURE ID PANEL (REFLEXED) - BCID2

## 2022-06-25 LAB — RENAL FUNCTION PANEL
Albumin: 1.8 g/dL — ABNORMAL LOW (ref 3.5–5.0)
Anion gap: 12 (ref 5–15)
BUN: 51 mg/dL — ABNORMAL HIGH (ref 8–23)
CO2: 25 mmol/L (ref 22–32)
Calcium: 8.1 mg/dL — ABNORMAL LOW (ref 8.9–10.3)
Chloride: 98 mmol/L (ref 98–111)
Creatinine, Ser: 1.97 mg/dL — ABNORMAL HIGH (ref 0.44–1.00)
GFR, Estimated: 26 mL/min — ABNORMAL LOW (ref >60)
Glucose, Bld: 122 mg/dL — ABNORMAL HIGH (ref 70–99)
Phosphorus: 6.1 mg/dL — ABNORMAL HIGH (ref 2.5–4.6)
Potassium: 3.7 mmol/L (ref 3.5–5.1)
Sodium: 135 mmol/L (ref 135–145)

## 2022-06-25 LAB — HEPATIC FUNCTION PANEL
ALT: 20 U/L (ref 0–44)
AST: 26 U/L (ref 15–41)
Albumin: 1.9 g/dL — ABNORMAL LOW (ref 3.5–5.0)
Alkaline Phosphatase: 142 U/L — ABNORMAL HIGH (ref 38–126)
Bilirubin, Direct: 2.4 mg/dL — ABNORMAL HIGH (ref 0.0–0.2)
Indirect Bilirubin: 1.4 mg/dL — ABNORMAL HIGH (ref 0.3–0.9)
Total Bilirubin: 3.8 mg/dL — ABNORMAL HIGH (ref 0.3–1.2)
Total Protein: 6.2 g/dL — ABNORMAL LOW (ref 6.5–8.1)

## 2022-06-25 LAB — GLUCOSE, CAPILLARY
Glucose-Capillary: 112 mg/dL — ABNORMAL HIGH (ref 70–99)
Glucose-Capillary: 113 mg/dL — ABNORMAL HIGH (ref 70–99)
Glucose-Capillary: 114 mg/dL — ABNORMAL HIGH (ref 70–99)
Glucose-Capillary: 114 mg/dL — ABNORMAL HIGH (ref 70–99)
Glucose-Capillary: 116 mg/dL — ABNORMAL HIGH (ref 70–99)
Glucose-Capillary: 144 mg/dL — ABNORMAL HIGH (ref 70–99)
Glucose-Capillary: 154 mg/dL — ABNORMAL HIGH (ref 70–99)

## 2022-06-25 LAB — EYE CULTURE W GRAM STAIN

## 2022-06-25 LAB — LACTIC ACID, PLASMA: Lactic Acid, Venous: 2.8 mmol/L (ref 0.5–1.9)

## 2022-06-25 LAB — VANCOMYCIN, RANDOM: Vancomycin Rm: 10 ug/mL

## 2022-06-25 LAB — BLOOD GAS, VENOUS
Acid-Base Excess: 0.5 mmol/L (ref 0.0–2.0)
Bicarbonate: 25.4 mmol/L (ref 20.0–28.0)
O2 Saturation: 73.7 %
Patient temperature: 37
pCO2, Ven: 41 mmHg — ABNORMAL LOW (ref 44–60)
pH, Ven: 7.4 (ref 7.25–7.43)
pO2, Ven: 41 mmHg (ref 32–45)

## 2022-06-25 LAB — HEMOGLOBIN A1C
Hgb A1c MFr Bld: 5.6 % (ref 4.8–5.6)
Mean Plasma Glucose: 114.02 mg/dL

## 2022-06-25 LAB — BASIC METABOLIC PANEL
Anion gap: 18 — ABNORMAL HIGH (ref 5–15)
BUN: 50 mg/dL — ABNORMAL HIGH (ref 8–23)
CO2: 20 mmol/L — ABNORMAL LOW (ref 22–32)
Calcium: 9.3 mg/dL (ref 8.9–10.3)
Chloride: 102 mmol/L (ref 98–111)
Creatinine, Ser: 1.63 mg/dL — ABNORMAL HIGH (ref 0.44–1.00)
GFR, Estimated: 32 mL/min — ABNORMAL LOW (ref >60)
Glucose, Bld: 117 mg/dL — ABNORMAL HIGH (ref 70–99)
Potassium: 3.8 mmol/L (ref 3.5–5.1)
Sodium: 140 mmol/L (ref 135–145)

## 2022-06-25 LAB — LEGIONELLA PNEUMOPHILA SEROGP 1 UR AG: L. pneumophila Serogp 1 Ur Ag: NEGATIVE

## 2022-06-25 LAB — PROCALCITONIN: Procalcitonin: 21.81 ng/mL

## 2022-06-25 MED ORDER — HYDROMORPHONE HCL 1 MG/ML IJ SOLN
0.5000 mg | INTRAMUSCULAR | Status: DC | PRN
  Administered 2022-06-25 – 2022-06-28 (×12): 0.5 mg via INTRAVENOUS
  Filled 2022-06-25 (×13): qty 1

## 2022-06-25 MED ORDER — POLYMYXIN B-TRIMETHOPRIM 10000-0.1 UNIT/ML-% OP SOLN
1.0000 [drp] | OPHTHALMIC | Status: DC
  Administered 2022-06-25 – 2022-06-28 (×12): 1 [drp] via OPHTHALMIC
  Filled 2022-06-25: qty 10

## 2022-06-25 MED ORDER — SODIUM CHLORIDE 0.9% FLUSH
10.0000 mL | Freq: Three times a day (TID) | INTRAVENOUS | Status: DC
  Administered 2022-06-25 – 2022-06-28 (×10): 10 mL via INTRAPLEURAL

## 2022-06-25 MED ORDER — MORPHINE SULFATE (PF) 2 MG/ML IV SOLN: 1.0000 mg | Freq: Once | INTRAVENOUS | Status: DC

## 2022-06-25 MED ORDER — VANCOMYCIN HCL IN DEXTROSE 1-5 GM/200ML-% IV SOLN
1000.0000 mg | Freq: Once | INTRAVENOUS | Status: AC
  Administered 2022-06-25: 1000 mg via INTRAVENOUS
  Filled 2022-06-25: qty 200

## 2022-06-25 MED ORDER — SODIUM CHLORIDE 0.9% FLUSH
30.0000 mL | Freq: Four times a day (QID) | INTRAVENOUS | Status: DC
  Administered 2022-06-25 – 2022-06-28 (×12): 30 mL

## 2022-06-25 MED ORDER — LACTATED RINGERS IV SOLN: INTRAVENOUS | Status: DC

## 2022-06-25 MED ORDER — SODIUM CHLORIDE 0.9 % IV SOLN
2.0000 g | INTRAVENOUS | Status: DC
  Administered 2022-06-25: 2 g via INTRAVENOUS
  Filled 2022-06-25: qty 2

## 2022-06-25 MED ORDER — SODIUM CHLORIDE (PF) 0.9 % IJ SOLN
10.0000 mg | Freq: Two times a day (BID) | INTRAMUSCULAR | Status: AC
  Administered 2022-06-25 – 2022-06-27 (×3): 10 mg via INTRAPLEURAL
  Filled 2022-06-25 (×6): qty 10

## 2022-06-25 MED ORDER — LABETALOL HCL 5 MG/ML IV SOLN: 10.0000 mg | INTRAVENOUS | Status: DC | PRN

## 2022-06-25 MED ORDER — STERILE WATER FOR INJECTION IJ SOLN
5.0000 mg | Freq: Two times a day (BID) | RESPIRATORY_TRACT | Status: AC
  Administered 2022-06-25 – 2022-06-27 (×3): 5 mg via INTRAPLEURAL
  Filled 2022-06-25 (×6): qty 5

## 2022-06-25 MED ORDER — DEXAMETHASONE SODIUM PHOSPHATE 10 MG/ML IJ SOLN
10.0000 mg | Freq: Four times a day (QID) | INTRAMUSCULAR | Status: DC
  Administered 2022-06-25 – 2022-06-28 (×11): 10 mg via INTRAVENOUS
  Filled 2022-06-25 (×12): qty 1

## 2022-06-25 MED ORDER — PANTOPRAZOLE SODIUM 40 MG IV SOLR
40.0000 mg | INTRAVENOUS | Status: DC
  Administered 2022-06-25 – 2022-06-27 (×3): 40 mg via INTRAVENOUS
  Filled 2022-06-25 (×3): qty 10

## 2022-06-25 MED ORDER — STERILE WATER FOR INJECTION IV SOLN
INTRAVENOUS | Status: DC
  Filled 2022-06-25: qty 150

## 2022-06-25 MED ORDER — ACETAMINOPHEN 10 MG/ML IV SOLN
1000.0000 mg | Freq: Once | INTRAVENOUS | Status: AC
  Administered 2022-06-25: 1000 mg via INTRAVENOUS
  Filled 2022-06-25: qty 100

## 2022-06-25 MED ORDER — SODIUM CHLORIDE 0.9 % IV SOLN: 10.0000 mg | Freq: Two times a day (BID) | INTRAVENOUS | Status: DC

## 2022-06-25 MED ORDER — DIAZEPAM 5 MG/ML IJ SOLN
2.5000 mg | Freq: Once | INTRAMUSCULAR | Status: AC
  Administered 2022-06-25: 2.5 mg via INTRAVENOUS
  Filled 2022-06-25: qty 2

## 2022-06-25 NOTE — Progress Notes (Signed)
PHARMACY - PHYSICIAN COMMUNICATION CRITICAL VALUE ALERT - BLOOD CULTURE IDENTIFICATION (BCID)  Sherry Mosley is an 77 y.o. female who presented to [facility] on 06/11/2022 with a chief complaint of severe sepsis.   Assessment:  This is further work up of blood cx originally ID'd at [facility] as Gram pos rods and sent to [facility] for further ID.  [facility] ID'd specimen as Gram pos cocci in chains ,   Strep pneumo growing in 3 of 3 bottles (2 aerobic, 1 anaerobic) , no resistance detected.    - Pt has rash listed as rxn to cephalosporins, could not confirm she had tolerated any cephalosporins previously.   Name of physician (or Provider) Contacted: Cheryll Cockayne Rust-Chester, NP   Current antibiotics: Vanc, aztreonam and metronidazole  Changes to prescribed antibiotics recommended:  NP ok'd to d/c aztreonam and continue Vancomycin given her allergy to cephalosporins.  NP wants to continue metronidazole for now b/c of concern for possible further anaerobic infection in loculated fluid in lung.  NP would like to defer to morning AM staff to decide to d/c metronidazole.   Results for orders placed or performed during the hospital encounter of 06/15/2022  Blood Culture ID Panel (Reflexed) (Collected: 06/14/2022 10:09 AM)  Result Value Ref Range   Enterococcus faecalis NOT DETECTED NOT DETECTED   Enterococcus Faecium NOT DETECTED NOT DETECTED   Listeria monocytogenes NOT DETECTED NOT DETECTED   Staphylococcus species NOT DETECTED NOT DETECTED   Staphylococcus aureus (BCID) NOT DETECTED NOT DETECTED   Staphylococcus epidermidis NOT DETECTED NOT DETECTED   Staphylococcus lugdunensis NOT DETECTED NOT DETECTED   Streptococcus species DETECTED (A) NOT DETECTED   Streptococcus agalactiae NOT DETECTED NOT DETECTED   Streptococcus pneumoniae DETECTED (A) NOT DETECTED   Streptococcus pyogenes NOT DETECTED NOT DETECTED   A.calcoaceticus-baumannii NOT DETECTED NOT DETECTED   Bacteroides fragilis NOT DETECTED NOT DETECTED    Enterobacterales NOT DETECTED NOT DETECTED   Enterobacter cloacae complex NOT DETECTED NOT DETECTED   Escherichia coli NOT DETECTED NOT DETECTED   Klebsiella aerogenes NOT DETECTED NOT DETECTED   Klebsiella oxytoca NOT DETECTED NOT DETECTED   Klebsiella pneumoniae NOT DETECTED NOT DETECTED   Proteus species NOT DETECTED NOT DETECTED   Salmonella species NOT DETECTED NOT DETECTED   Serratia marcescens NOT DETECTED NOT DETECTED   Haemophilus influenzae NOT DETECTED NOT DETECTED   Neisseria meningitidis NOT DETECTED NOT DETECTED   Pseudomonas aeruginosa NOT DETECTED NOT DETECTED   Stenotrophomonas maltophilia NOT DETECTED NOT DETECTED   Candida albicans NOT DETECTED NOT DETECTED   Candida auris NOT DETECTED NOT DETECTED   Candida glabrata NOT DETECTED NOT DETECTED   Candida krusei NOT DETECTED NOT DETECTED   Candida parapsilosis NOT DETECTED NOT DETECTED   Candida tropicalis NOT DETECTED NOT DETECTED   Cryptococcus neoformans/gattii NOT DETECTED NOT DETECTED    Sherry Mosley D 06/25/2022  3:37 AM

## 2022-06-25 NOTE — Progress Notes (Signed)
*  PRELIMINARY RESULTS* Echocardiogram 2D Echocardiogram has been attempted. Patient going to MRI then EEG.  Carolyne Fiscal 06/25/2022, 1:44 PM

## 2022-06-25 NOTE — Progress Notes (Addendum)
BRIEF PCCM NOTE  MRI performed and with no acute abnormality.  Remains very altered.  Discussed with Dr. Arville Lime, Strep Meningitis remains in differential and will need to be ruled out.  Will consult IR for LP, and will place on scheduled Decadron, continue ABX as per ID.    Also discussed with Opthalmology per ID recommendations as right eye conjunctivitis may or may not be related to Strep Pneumo.  Will obtain culture of right eye, continue with systemic IV ABX for now.  If eye culture shows different organism then can add appropriate ophthalmic drops.   Pt also with increased WOB and tachypnea (RR 30's), placing back on BiPAP. Will obtain VBG, BMP, and lactic to rule out any metabolic acidosis she could potentially be trying to compensate for, along with Chest X-ray     Harlon Ditty, AGACNP-BC [facility] Prefer epic messenger for cross cover needs If after hours, please call E-link

## 2022-06-25 NOTE — Progress Notes (Addendum)
Pharmacy Antibiotic Note  Sherry Mosley is a 77 y.o. female admitted on 06/17/2022 with pneumonia.  Pharmacy has been consulted for vancomycin dosing. Patient with S pneumoniae bacteremia and loculated pleural effusion.  She has rash documented for PCN and cephalosporins (no specific agent listed), contacted PCP office in [place] when reaction occurred ~5y ago. See progress note from 4/26 re: allergy clarification.  Patient without any previously documented cephalosporin use.  She has PMH of NHL on imbrutinib  Today, 06/25/2022 Day #2 vancomycin/metronidazole Renal: AKI - SCr 1.97 WBC 20.1 Afebrile S. Pneumoniae Ur Ag: positive Pleural fluid: LDH and Nucleated cells elevated consistent with exudate. Fluid culture collected Receiving TPA/dornase  Vancomycin level: Vancomycin 1500mg  IV x 1 given 4/25 at 11:30 Random vancomycin level 4/26 at 11:52 = 10 mcg/mL Plan: Due to unclear allergy to cephalosporin, vancomycin ordered for S pneumoniae.   Based on random vancomycin level, give vancomycin 1gm IV x 1 today and reheck random vancomycin level 4/27. Dose per random levels until SCr stable Follow renal function and cultures ID following  Continue to follow for ability to use ceftriaxone if able to get more information re: drug.  Son is to arrive 4/26.    Height: 5\' 3"  (160 cm) Weight: 69.9 kg (154 lb 1.6 oz) IBW/kg (Calculated) : 52.4  Temp (24hrs), Avg:98.9 F (37.2 C), Min:98.6 F (37 C), Max:99.7 F (37.6 C)  Recent Labs  Lab 06/23/2022 1009 06/28/2022 1245 06/01/2022 1440 06/05/2022 1810 06/06/2022 2120 06/25/22 0508 06/25/22 0510 06/25/22 1152  WBC 25.3*  --   --   --   --   --  20.1*  --   CREATININE 2.25*  --   --   --  1.99* 1.97*  --   --   LATICACIDVEN 4.5* 2.0* 1.4 2.2*  --   --   --   --   VANCORANDOM  --   --   --   --   --   --   --  10     Estimated Creatinine Clearance: 22.1 mL/min (A) (by C-G formula based on SCr of 1.97 mg/dL (H)).    Allergies  Allergen Reactions    Cephalosporins     Rash   Penicillins     Significant Rash    Antimicrobials this admission: 4/25 vancomycin >>  4/25 metronidazole >> 4/25 aztreonam >> 4/26   Dose adjustments this admission: N/A  Microbiology results: 4/25 BCx: GPC 3/4 bottles, BCID detects S pneumoniae 4/25 MRSA PCR: neg  Thank you for allowing pharmacy to be a part of this patient's care.  Juliette Alcide, PharmD, BCPS, BCIDP Work Cell: [phone] 06/25/2022 3:41 PM

## 2022-06-25 NOTE — Progress Notes (Signed)
Eeg done 

## 2022-06-25 NOTE — Progress Notes (Signed)
Pharmacy - Brief Note - Antibiotic allergy clarification  Patient with penicillin and cephalosporin allergy listed in chart.  No documentation of which cephalosporin.  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 Husband states had reaction to penicillin derivative ~5 years ago.  She developed a rash that was treated by stopping the antibiotic (no further treatment required and no anaphylactic type of reaction occurred). Family suggested contacting previous PCP, Dr Rogelio Seen in [place], [place] with [facility].  Called [phone] and spoke with office. Their records have same allergies listed, no specific cephalosporin agent list with reaction as medium-severity rash.  She was unable to locate any information in chart that stated the name of the antibiotic.  Juliette Alcide, PharmD, BCPS, BCIDP Work Cell: [phone] 06/25/2022 3:24 PM

## 2022-06-25 NOTE — Progress Notes (Signed)
NAME:  Sherry Mosley, MRN:  [id number], DOB:  [birth year], LOS: 1 ADMISSION DATE:  06/13/2022, CONSULTATION DATE:  06/07/2022 REFERRING MD:  Dr. Fanny Bien, CHIEF COMPLAINT:  Altered Mental Status   Brief Pt Description / Synopsis:  77 y.o. female with PMHx significant for Non Hodgkin's Lymphoma, who is admitted with Acute Metabolic Encephalopathy and Acute Hypoxic Respiratory Failure due to Severe Sepsis from STREP PNEUMO BACTEREMIA in setting of Right sided Pneumonia and loculated right sided pleural effusion, along with Acute Kidney Injury and Anion Gap metabolic acidosis.  History of Present Illness:  Sherry Mosley is a 77 year old female with a past medical history significant for non-Hodgkin's lymphoma, hypertension, hyperlipidemia who presents to [facility] ED on 06/05/2022 due to altered mental status.  Patient is currently altered and unable to contribute to history, therefore history is obtained from patient's husband at bedside along with chart review.  Per the patient's husband, she was seen a couple days ago by her ophthalmologist and diagnosed with conjunctivitis and was prescribed antibiotic drops.  He also reports that she was complaining of soreness with her right ribs, however she did not want to seek medical care at that time.  He reports she has had increasing fatigue and weakness along with multiple falls.  The night before last she fell onto her buttocks, did not hit her head, and he was able to get her back to bed.  This morning she was extremely confused prompting him to contact EMS for further medical evaluation.  ED Course: Initial Vital Signs: Pulse 151, respiratory rate 36, blood pressure 144/84, SpO2 100% on BiPAP, temperature 101.2 F rectally Significant Labs: Sodium 133, bicarb 17, anion gap 16, BUN 52, creatinine 2.25, glucose 218, alkaline phosphatase 197, total bilirubin 3.9, albumin 2.2, high-sensitivity troponin 49, lactic acid 4.5 (later trended down to 2.0), procalcitonin 4, WBC  25.3 with neutrophilia, hemoglobin 11.9, hematocrit 34.4 VBG: pH 7.43/pCO2 28/pO2 less than 31/bicarb 18.6 COVID-19 PCR is negative Urinalysis is negative for UTI EKG: EKG time: 1010 heart rate 150 QRS 80 QTc 460 Sinus tachycardia.  Nonspecific ST abnormality, no frank ischemia.  Possible rate related.  Cannot rule out ischemia Imaging: Chest X-ray>>IMPRESSION: Small right effusion with adjacent lung opacity. Acute infiltrates possible. Recommend follow-up to confirm clearance CT chest/abdomen/pelvis>>IMPRESSION: 1. Examination is generally limited by breath motion artifact and lack of intravenous contrast. 2. Moderate, loculated right pleural effusion with extensive associated atelectasis or consolidation. No overlying rib fracture. 3. Large contusion and hematoma of the left buttock. 4. No significant change in retroperitoneal, iliac, inguinal, and mesenteric lymphadenopathy, as well as splenomegaly, in keeping with history of lymphoma. 5. Hepatomegaly. Aortic Atherosclerosis CT Head/Cervical Spine>>IMPRESSION: 1. No acute intracranial pathology. Small-vessel white matter disease. 2. No fracture or static subluxation of the cervical spine. 3. Severe disc space height loss and osteophytosis from C5 through C7 with otherwise relatively preserved disc spaces. 4. Partially imaged right-sided pleural mass or fluid; please see forthcoming dedicated examination of the chest abdomen and pelvis. Medications Administered: 1.725 L of LR boluses, IV aztreonam, Flagyl, and vancomycin  She met sepsis criteria therefore she was given IV fluid resuscitation along with broad-spectrum antibiotics.  Blood cultures were drawn.  Patient's husband at bedside and her son who is an ED physician in [place] both verify that patient is to be a DNR/DNI given her previous wishes.  PCCM is asked to admit for further workup and treatment.  Please see "significant hospital events" section below for full  detailed hospital course.  Pertinent  Medical History   Past Medical History:  Diagnosis Date   Generalized osteoarthritis    H/O benign breast biopsy 1964   H/O bilateral cataract extraction 2005   Hyperlipidemia    Hypertension    Mood disorder (HCC)    Non Hodgkin's lymphoma (HCC)    Pancreatitis 10/20/2012   unknown etiology   Postmenopausal    S/P ankle arthrodesis 2012   Right   Sleep disorder    Uterine fibroid      Micro Data:  4/25: SARS-CoV-2 PCR>> negative 4/25: Respiratory viral panel>>negative 4/25: Blood culture x 2>>Streptococcus pneumoniae  4/25: Strep pneumo urinary antigen>> 4/25: Legionella urine antigen>> 4/25: Pleural fluid>> 4/25: MRSA PCR>>negative  Antimicrobials:   Anti-infectives (From admission, onward)    Start     Dose/Rate Route Frequency Ordered Stop   06/09/2022 2300  metroNIDAZOLE (FLAGYL) IVPB 500 mg        500 mg 100 mL/hr over 60 Minutes Intravenous Every 12 hours 06/21/2022 2121     06/09/2022 2200  aztreonam (AZACTAM) 1 g in sodium chloride 0.9 % 100 mL IVPB  Status:  Discontinued        1 g 200 mL/hr over 30 Minutes Intravenous Every 8 hours 06/25/2022 1302 06/25/22 0336   06/19/2022 1257  vancomycin variable dose per unstable renal function (pharmacist dosing)         Does not apply See admin instructions 06/27/2022 1302     06/06/2022 1245  metroNIDAZOLE (FLAGYL) IVPB 500 mg        500 mg 100 mL/hr over 60 Minutes Intravenous  Once 06/15/2022 1242 06/17/2022 1349   06/09/2022 1100  vancomycin (VANCOREADY) IVPB 1500 mg/300 mL        1,500 mg 150 mL/hr over 120 Minutes Intravenous  Once 06/25/2022 1032 06/01/2022 1338   06/21/2022 1030  aztreonam (AZACTAM) 2 g in sodium chloride 0.9 % 100 mL IVPB        2 g 200 mL/hr over 30 Minutes Intravenous  Once 06/20/2022 1021 06/11/2022 1133   05/31/2022 1030  vancomycin (VANCOCIN) IVPB 1000 mg/200 mL premix  Status:  Discontinued        1,000 mg 200 mL/hr over 60 Minutes Intravenous  Once 06/13/2022 1021  06/19/2022 1032        Significant Hospital Events: Including procedures, antibiotic start and stop dates in addition to other pertinent events   4/25: Presented to ED with altered mental status and acute respiratory failure requiring BiPAP.  Patient is DNR/DNI per her previous wishes.  Critically ill with multiorgan failure, PCCM asked to admit.  IR placed chest tube for loculated right-sided pleural effusion. 4/26: Blood cultures with Strep Pneumo, consult ID.  Pleural fluid consistent Exudative effusion, pleural culture still pending. Chest tube with minimal drainage, plan to instill tpa/dornase.  AKI slowly improving, respiratory status improving, weaned off BiPAP to [place].  Remains encephalopathic, EEG pending  Interim History / Subjective:  -No significant events noted overnight -Afebrile, hemodynamically stable, no vasopressors, weaned to 2L [place], respiratory status much improved -Chest tube with 50 cc output documented last night ~ Discussed with Dr. Arville Lime ~ plan to instill tpa/dornase -Pleural fluid consistent with Exudative effusion, culture still pending as to whether is parapneumonic vs empyema -Blood cultures came back with Strep Pneumo ~ consult ID -WBC trended down to 20 from 25 -Creatinine improved to 1.97 from 2.25, UOP 860 cc (net + 3.6 L), electrolytes acceptable, acidosis resolved ~ will stop bicarb gtt and resume gentle maintenance  LR -Now awake and tracks, but not following commands ~ family also reports a fine tremor which is not baseline ~ EEG pending  Objective   Blood pressure 113/60, pulse (!) 107, temperature 98.6 F (37 C), temperature source Axillary, resp. rate (!) 24, height 5\' 3"  (1.6 m), weight 69.9 kg, SpO2 96 %.    FiO2 (%):  [35 %-45 %] 40 %   Intake/Output Summary (Last 24 hours) at 06/25/2022 1610 Last data filed at 06/25/2022 0400 Gross per 24 hour  Intake 4590.15 ml  Output 916 ml  Net 3674.15 ml   Filed Weights   06/25/2022 1046 06/22/2022 1430 06/25/22  0500  Weight: 69 kg 67.7 kg 69.9 kg    Examination: General: Acutely ill-appearing female, laying in bed, on [place], in NAD HENT: Atraumatic, normocephalic, neck supple, no JVD conjunctivitis to right eye (see images below) Lungs: Coarse breath sounds bilaterally, no wheezing or rales noted,even, nonlabored Cardiovascular: Tachycardia, regular rhythm, S1-S2, no murmurs, rubs, gallops Abdomen: Obese, soft, nontender, nondistended, no guarding or rebound tenderness, bowel sounds positive x 4 Extremities: Normal bulk and tone, no deformities, trace edema BLE Neuro: Awake, eyes track intermittently, currently not following commands or speaking , unable to assess orientation, pupils PERRLA GU: Foley catheter in place draining yellow urine Skin: Scattered ecchymosis with large bruising to right buttock/hip (see images below)          Resolved Hospital Problem list     Assessment & Plan:   #Acute Hypoxic Respiratory Failure in the setting of Pneumonia & Right sided Loculated Pleural Effusion -Supplemental O2 as needed to maintain O2 sats >92% -BiPAP, wean as tolerated (pt is DNR/DNI) ~ weaned off BiPAP currently -Follow intermittent Chest X-ray & ABG as needed -Bronchodilators  -ABX as above -Continue chest tube ~ discussed with Dr. Arville Lime, plan to instill tPA and Dornase -Pleural fluid consistent with EXUDATE, culture still pending differentiate b/w Parapneumonic effusion vs. Empyema  -Pulmonary toilet as able  #Severe Sepsis in setting of STREPTOCOCCUS PNEUMONIAE BACTEREMIA due to Pneumonia & Loculated pleural effusion (Meets SIRS criteria at admission: Temp. 101.2, RR 37, HR 153, WBC 25.3, Lactic 4.5) #Right eye Conjunctivitis (present on admission, was being treat outpatient) -Monitor fever curve -Trend WBC's & Procalcitonin -Follow cultures as above  -Consult ID, appreciate, will defer ABX to ID~ currently on empiric, Aztreonam,Flagyl & Vancomycin pending cultures & sensitivities  given PCN allergy  #Mildly Elevated Troponin, suspect demand ischemia #Tachycardia due to Sepsis -Continuous cardiac monitoring -Maintain MAP >65 -Gentle IV fluids -Vasopressors as needed to maintain MAP goal ~ currently not requiring -Lactic acid has normalized -HS Troponin peaked at 55  #Acute Kidney Injury ~ IMPROVING #AG Metabolic Acidosis due to Lactic Acidosis and AKI ~ RESOLVED #Mild Hyponatremia ~ RESOLVED -Monitor I&O's / urinary output -Follow BMP -Ensure adequate renal perfusion -Avoid nephrotoxic agents as able -Replace electrolytes as indicated -IV fluids -Stop Bicarb gtt 4/26 and start LR @ 75 cc/hr  #Elevated LFT's ~ IMPROVING CT Abdomen with hepatomegaly.  NO gallstones, gallbladder thickening, or biliary dilatation. RUQ [place]: no evidence of cholelithiasis or cholecystitis.  Trace free fluid in RUQ. -Trend LFTs  & coags  #Normocytic Normochromic Anemia without s/sx of overt blood loss -Monitor for S/Sx of bleeding -Trend CBC -Heparin SQ for VTE Prophylaxis  -Transfuse for Hgb <7  #Hyperglycemia, likely stress induced from critical illness -CBG's q4h; Target range of 140 to 180 -SSI -Follow ICU Hypo/Hyperglycemia protocol -Check Hgb A1c  #Acute Metabolic Encephalopathy in setting of Severe Sepsis/Bacteremia,  AKI, and metabolic acidosis CT Head negative for acute intracranial abnormality UDS + for amphetamines and Benzodiazepines -Treatment of sepsis and metabolic derangements as outlined above -Provide supportive care -Promote normal sleep/wake cycle and family presence -Avoid sedating meds as able -EEG is pending -Consider MRI tomorrow 4/27 if no improvement in mental status with treatment of above metabolic derangements      Patient is critically ill with multiorgan failure.  Prognosis is guarded, high risk for further decompensation, cardiac arrest and death.     Best Practice (right click and "Reselect all SmartList Selections" daily)    Diet/type: NPO DVT prophylaxis: prophylactic heparin  GI prophylaxis: N/A Lines: N/A Foley:  yes, and is still needed Code Status:  DNR Last date of multidisciplinary goals of care discussion [4/26]  4/26: Pt's husband and daughter updated at bedside.    Labs   CBC: Recent Labs  Lab 06/12/2022 1009 06/25/22 0510  WBC 25.3* 20.1*  NEUTROABS 21.4*  --   HGB 11.9* 9.5*  HCT 34.4* 27.7*  MCV 90.1 90.2  PLT 232 175     Basic Metabolic Panel: Recent Labs  Lab 06/26/2022 1009 06/12/2022 2120 06/25/22 0508  NA 133* 133* 135  K 4.7 4.1 3.7  CL 100 99 98  CO2 17* 23 25  GLUCOSE 218* 163* 122*  BUN 52* 54* 51*  CREATININE 2.25* 1.99* 1.97*  CALCIUM 9.1 8.2* 8.1*  PHOS  --   --  6.1*    GFR: Estimated Creatinine Clearance: 22.1 mL/min (A) (by C-G formula based on SCr of 1.97 mg/dL (H)). Recent Labs  Lab 06/04/2022 1009 06/20/2022 1245 06/09/2022 1440 06/10/2022 1810 06/25/22 0510  PROCALCITON 4.04  --   --   --  21.81  WBC 25.3*  --   --   --  20.1*  LATICACIDVEN 4.5* 2.0* 1.4 2.2*  --      Liver Function Tests: Recent Labs  Lab 06/12/2022 1009 06/26/2022 1722 06/25/22 0508 06/25/22 0510  AST 40  --   --  26  ALT 28  --   --  20  ALKPHOS 197*  --   --  142*  BILITOT 3.9*  --   --  3.8*  PROT 7.2 5.9*  --  6.2*  ALBUMIN 2.2*  --  1.8* 1.9*    No results for input(s): "LIPASE", "AMYLASE" in the last 168 hours. Recent Labs  Lab 06/26/2022 1009  AMMONIA 11     ABG    Component Value Date/Time   HCO3 24.0 06/17/2022 2120   ACIDBASEDEF 0.2 06/17/2022 2120   O2SAT 84.9 06/02/2022 2120     Coagulation Profile: Recent Labs  Lab 06/22/2022 1009  INR 1.3*     Cardiac Enzymes: Recent Labs  Lab 06/15/2022 1009  CKTOTAL 111     HbA1C: No results found for: "HGBA1C"  CBG: Recent Labs  Lab 06/04/2022 1719 06/19/2022 1816 06/23/2022 1946 06/25/22 0013 06/25/22 0316  GLUCAP 155* 155* 163* 114* 112*     Review of Systems:   Unable to assess due to  AMS   Past Medical History:  She,  has a past medical history of Generalized osteoarthritis, H/O benign breast biopsy (1964), H/O bilateral cataract extraction (2005), Hyperlipidemia, Hypertension, Mood disorder (HCC), Non Hodgkin's lymphoma (HCC), Pancreatitis (10/20/2012), Postmenopausal, S/P ankle arthrodesis (2012), Sleep disorder, and Uterine fibroid.   Surgical History:   Past Surgical History:  Procedure Laterality Date   ABDOMINAL HYSTERECTOMY  1997   ANKLE ARTHRODESIS Right 2012   BREAST  BIOPSY Left 1969   fibroadenoma   CATARACT EXTRACTION W/ INTRAOCULAR LENS IMPLANT Bilateral    LYMPH NODE BIOPSY  2014   excisional biopsy right groin   Thumb surgery     removal of part of extensor tendon     Social History:   reports that she has never smoked. She has never used smokeless tobacco. She reports current alcohol use. She reports that she does not use drugs.   Family History:  Her family history includes Alzheimer's disease in her mother; Diabetes in her maternal grandfather; Heart failure in her father; Prostate cancer in her father. There is no history of Breast cancer.   Allergies Allergies  Allergen Reactions   Cephalosporins     Rash   Penicillins     Significant Rash     Home Medications  Prior to Admission medications   Medication Sig Start Date End Date Taking? Authorizing Provider  prednisoLONE acetate (PRED FORTE) 1 % ophthalmic suspension Place 1 drop into both eyes every 4 (four) hours. 06/23/22  Yes [provider]  Ascorbic Acid (VITAMIN C) 1000 MG tablet Take 1 tablet by mouth as directed. Takes off and on , sporadically    [provider]  Cholecalciferol (VITAMIN D3) 50 MCG (2000 UT) capsule Take 1 capsule by mouth as directed. Takes off and of, sporadically    [provider]  ibrutinib (IMBRUVICA) 140 MG capsule Take 3 capsules (420 mg total) by mouth daily. Take with a glass of water. 03/10/22   Johney Maine, MD   lisinopril (ZESTRIL) 5 MG tablet TAKE 1 TABLET EVERY DAY 05/14/22   Sharon Seller, NP  Loratadine 10 MG CAPS Take 1 tablet by mouth daily.    [provider]  simvastatin (ZOCOR) 20 MG tablet TAKE 1 TABLET EVERY DAY 05/14/22   Sharon Seller, NP  temazepam (RESTORIL) 15 MG capsule Take one capsule by mouth every night at bedtime as needed for sleep. 02/25/22   Sharon Seller, NP  vitamin E 180 MG (400 UNITS) capsule Take 400 Units by mouth as directed. Takes off and on, sporadically    [provider]     Critical care time: 40 minutes     Harlon Ditty, AGACNP-BC [facility] Prefer epic messenger for cross cover needs If after hours, please call E-link

## 2022-06-25 NOTE — Progress Notes (Signed)
Pt becoming increasingly more agitated. Pt able to wiggle toes to commands, but unable to follow all commands. Pt now more tachycardic and tachypneic. Dr Arville Lime given update on pt condition.

## 2022-06-25 NOTE — Consult Note (Signed)
[facility]                                                                                       Patient Identification: Patient Name: Sherry Mosley MRN: [id number] Admit Date: 06/28/2022 10:05 AM Today's Date: 06/25/2022 Reason for consult: bacteremia  Requesting provider: Rhea Bleacher   Principal Problem:   Severe sepsis (HCC)   Antibiotics:  Vancomycin 4/25 Aztreonam 4/25 Metronidazole 4/25  Lines/Hardware:  Assessment 77 year old female with PMH as below including HLD, HTN, non-Hodgkin lymphoma on Ibrutinib followed by Dr Candise Che, pancreatitis who presented to the ED from home on 4/25 after a fall *2 as well as AMS. Admitted with   # Strep pneumo bacteremia 2/2   # Pneumonia/Possible para-pneumonic effusion r/o empyema  S/p right-sided chest tube placement by IR on 4/25.  WBC 1972, exudative by lights criteria. Cx collected. PCCM planning for tpa  #Acute encephalopathy - suspect 2/2 toxic/metabolic in the setting of sepsis/AKI/acidosis and expect to improve, will  need to consider meningitis if no improvement   # Rt eye conjunctivitis - may or may not be related to strep pneumonia, improving per husband on eye drops  # Penicillin/cephalosporin allergy - Husband reports having rash when she had taken Penicillin derivative ( unknown name ) 5 years ago taken OP and stopped it. No face, throat swelling or need to go to hospital. Husband or daughter  is not aware about patient taking cephalosporin group of abtx or  cephalosporin allergy. PCN and c-sporin allergy listed as medium severity - rash per PCP's office at [place].   Recommendations  Continue Vancomycin, pharmacy to dose and metronidazole for now given h/o above Repeat blood cx 2 sets ordered for tomorrow  Fu pleural fluid cx  TTE Monitor CBC, BMP, Vancomycin trough  Will need to ro strep meningitis if no improvement in mental status  with current management  Start eye drops and Ophthalmology evaluation  I am available remotely this weekend. Please call with active questions.  Dr Thedore Mins here on Monday    Rest of the management as per the primary team. Please call with questions or concerns.  Thank you for the consult  __________________________________________________________________________________________________________ HPI and Hospital Course: History from chart review as well as family members 77 year old female with PMH as below including HLD, HTN, non-Hodgkin lymphoma on Ibrutiinib, follows Dr Candise Che, pancreatitis who presented to the ED from home on 4/25 for fall x 2 and altered mental status.  Husband reports she had a pink eye that started on Sunday, was seen by PCP on Tuesday who referred to ophthalmologist, saw ophthalmologist on Tuesday, Wednesday and was receiving eyedrops, improving.  She was complaining of pain in her right ribs as well as was fatigued, fell down when she was trying to get up on the night of 24th.  Husband helped her back to her bed and did not appear to be injured.  She was confused in the morning and has been called 911.  Husband reports her body being warm but no known fevers, chills or sweats.  Denies any cough chest pain or shortness of breath.  Denies any GU  symptoms  At ED arrival, febrile, tachypneic tachycardic, SpO2 100% on BiPAP Labs remarkable for lactic acid 2.0, AKI with creatinine 1.99, WBC 25.3 Negative influenza A/influenza B, SARS-CoV-2 UA with no leukocytes or nitrites Strep pneumo antigen positive Imagings reviewed I am consulted for Streptococcus pneumonia bacteremia  ROS: Unavailable as patient is altered  Past Medical History:  Diagnosis Date   Generalized osteoarthritis    H/O benign breast biopsy 1964   H/O bilateral cataract extraction 2005   Hyperlipidemia    Hypertension    Mood disorder (HCC)    Non Hodgkin's lymphoma (HCC)    Pancreatitis 10/20/2012    unknown etiology   Postmenopausal    S/P ankle arthrodesis 2012   Right   Sleep disorder    Uterine fibroid    Past Surgical History:  Procedure Laterality Date   ABDOMINAL HYSTERECTOMY  1997   ANKLE ARTHRODESIS Right 2012   BREAST BIOPSY Left 1969   fibroadenoma   CATARACT EXTRACTION W/ INTRAOCULAR LENS IMPLANT Bilateral    LYMPH NODE BIOPSY  2014   excisional biopsy right groin   Thumb surgery     removal of part of extensor tendon    Scheduled Meds:  alteplase (CATHFLO ACTIVASE) 10 mg in sodium chloride (PF) 0.9 % 30 mL  10 mg Intrapleural BID   And   dornase alfa (PULMOZYME) 5 mg in sterile water (preservative free) 30 mL  5 mg Intrapleural BID   budesonide (PULMICORT) nebulizer solution  0.5 mg Nebulization BID   Chlorhexidine Gluconate Cloth  6 each Topical Daily   heparin  5,000 Units Subcutaneous Q8H   insulin aspart  0-9 Units Subcutaneous Q4H   ipratropium-albuterol  3 mL Nebulization Q4H   sodium chloride flush  10 mL Intrapleural Q8H   sodium chloride flush  30 mL Intracatheter Q6H   vancomycin variable dose per unstable renal function (pharmacist dosing)   Does not apply See admin instructions   Continuous Infusions:  metronidazole 100 mL/hr at 06/25/22 0814   sodium bicarbonate 150 mEq in sterile water 1,150 mL infusion 50 mL/hr at 06/25/22 0814   PRN Meds:.docusate sodium, polyethylene glycol  Allergies  Allergen Reactions   Cephalosporins     Rash   Penicillins     Significant Rash   Social History   Socioeconomic History   Marital status: Married    Spouse name: Not on file   Number of children: 2   Years of education: Not on file   Highest education level: Not on file  Occupational History   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 Son is alternate   Would accept resuscitation   No tube feeds if cognitively unaware   Social Determinants of Health   Financial Resource Strain: Not on file  Food Insecurity: No Food Insecurity (06/10/2022)   Hunger Vital Sign    Worried About Running Out of Food in the Last Year: Never true  Ran Out of Food in the Last Year: Never true  Transportation Needs: No Transportation Needs (06/23/2022)   PRAPARE - Administrator, Civil Service (Medical): No    Lack of Transportation (Non-Medical): No  Physical Activity: Not on file  Stress: Not on file  Social Connections: Not on file  Intimate Partner Violence: Not At Risk (06/18/2022)   Humiliation, Afraid, Rape, and Kick questionnaire    Fear of Current or Ex-Partner: No    Emotionally Abused: No    Physically Abused: No    Sexually Abused: No   Family History  Problem Relation Age of Onset   Alzheimer's disease Mother    Heart failure Father    Prostate cancer Father    Diabetes Maternal Grandfather    Breast cancer Neg Hx      Vitals BP 118/67   Pulse (!) 111   Temp 98.6 F (37 C) (Axillary)   Resp (!) 28   Ht 5\' 3"  (1.6 m)   Wt 69.9 kg   SpO2 95%   BMI 27.30 kg/m    Physical Exam Constitutional: Elderly female sitting in the bed, not in acute distress    Comments: Pink right eye with no discharge.  Left eye okay  Cardiovascular:     Rate and Rhythm: Normal rate and regular rhythm.     Heart sounds: S1 and S2  Pulmonary:     Effort: Pulmonary effort is normal on nasal cannula    Comments: Decreased air entry in the right side, crackles  Abdominal:     Palpations: Abdomen is soft.     Tenderness: Nondistended and nontender  Musculoskeletal:         General: No swelling or tenderness in peripheral joints  Skin:    Comments: No obvious rashes, rt hip area with large bruise   Neurological:     General: Spontaneously opens eyes but does not respond to verbal stimuli   Pertinent Microbiology Results for orders placed or performed during the hospital encounter of 06/10/2022  Culture, blood (Routine x 2)     Status: None (Preliminary result)   Collection Time: 06/16/2022 10:09 AM   Specimen: BLOOD LEFT FOREARM  Result Value Ref Range Status   Specimen Description   Final    BLOOD LEFT FOREARM Performed at [facility], [address] [place], [place] [address]    Special Requests   Final    BOTTLES DRAWN AEROBIC ONLY Blood Culture results may not be optimal due to an inadequate volume of blood received in culture bottles Performed at [facility], [address] [place], [place] [address]    Culture  Setup Time   Final    GRAM POSITIVE RODS AEROBIC BOTTLE ONLY CRITICAL VALUE NOTED.  VALUE IS CONSISTENT WITH PREVIOUSLY REPORTED AND CALLED VALUE. SKL CORRECTED RESULTS GRAM POSITIVE COCCI IN CHAINS PREVIOUSLY REPORTED AS: GRAM POSITIVE RODS CORRECTED RESULTS CALLED TO: J ROBBINS,PHARMD@0300  06/25/22 MK Performed at [facility], [address] [place], [place] [address]    Culture GRAM POSITIVE COCCI IN CHAINS  Final   Report Status PENDING  Incomplete  Culture, blood (Routine x 2)     Status: None (Preliminary result)   Collection Time: 06/03/2022 10:09 AM   Specimen: Right Antecubital; Blood  Result Value Ref Range Status   Specimen Description   Final    RIGHT ANTECUBITAL Performed at [facility], [address] [place], [place] [address]    Special Requests  Final    BOTTLES DRAWN AEROBIC AND ANAEROBIC Blood Culture results may not be optimal due to an excessive volume of blood received in culture bottles Performed at [facility], [address] [place], [place] [address]     Culture  Setup Time   Final    GRAM POSITIVE RODS IN BOTH AEROBIC AND ANAEROBIC BOTTLES CRITICAL RESULT CALLED TO, READ BACK BY AND VERIFIED WITH: Cheron Every @2048  on 06/10/2022 skl CORRECTED RESULTS GRAM POSITIVE COCCI IN CHAINS PREVIOUSLY REPORTED AS: GRAM POSITIVE RODS CORRECTED RESULTS CALLED TO: J ROBBINS,PHARMD@0300  06/25/22 MK Performed at [facility], [address], [place], [place] [address]    Culture GRAM POSITIVE COCCI IN CHAINS  Final   Report Status PENDING  Incomplete  Respiratory (~20 pathogens) panel by PCR     Status: None   Collection Time: 06/28/2022 10:09 AM   Specimen: Nasopharyngeal Swab; Respiratory  Result Value Ref Range Status   Adenovirus NOT DETECTED NOT DETECTED Final   Coronavirus 229E NOT DETECTED NOT DETECTED Final    Comment: (NOTE) The Coronavirus on the Respiratory Panel, DOES NOT test for the novel  Coronavirus (2019 nCoV)    Coronavirus HKU1 NOT DETECTED NOT DETECTED Final   Coronavirus NL63 NOT DETECTED NOT DETECTED Final   Coronavirus OC43 NOT DETECTED NOT DETECTED Final   Metapneumovirus NOT DETECTED NOT DETECTED Final   Rhinovirus / Enterovirus NOT DETECTED NOT DETECTED Final   Influenza A NOT DETECTED NOT DETECTED Final   Influenza B NOT DETECTED NOT DETECTED Final   Parainfluenza Virus 1 NOT DETECTED NOT DETECTED Final   Parainfluenza Virus 2 NOT DETECTED NOT DETECTED Final   Parainfluenza Virus 3 NOT DETECTED NOT DETECTED Final   Parainfluenza Virus 4 NOT DETECTED NOT DETECTED Final   Respiratory Syncytial Virus NOT DETECTED NOT DETECTED Final   Bordetella pertussis NOT DETECTED NOT DETECTED Final   Bordetella Parapertussis NOT DETECTED NOT DETECTED Final   Chlamydophila pneumoniae NOT DETECTED NOT DETECTED Final   Mycoplasma pneumoniae NOT DETECTED NOT DETECTED Final    Comment: Performed at [facility], [address], [place], [place] [address]  Blood Culture ID Panel (Reflexed)     Status: Abnormal   Collection  Time: 06/27/2022 10:09 AM  Result Value Ref Range Status   Enterococcus faecalis NOT DETECTED NOT DETECTED Final   Enterococcus Faecium NOT DETECTED NOT DETECTED Final   Listeria monocytogenes NOT DETECTED NOT DETECTED Final   Staphylococcus species NOT DETECTED NOT DETECTED Final   Staphylococcus aureus (BCID) NOT DETECTED NOT DETECTED Final   Staphylococcus epidermidis NOT DETECTED NOT DETECTED Final   Staphylococcus lugdunensis NOT DETECTED NOT DETECTED Final   Streptococcus species DETECTED (A) NOT DETECTED Final    Comment: CRITICAL RESULT CALLED TO, READ BACK BY AND VERIFIED WITH: J ROBBINS,PHARMD@0300  06/25/22 MK CORRECTED RESULTS GRAM POSITIVE COCCI IN CHAINS PREVIOUSLY REPORTED AS: GRAM POSITIVE RODS CORRECTED RESULTS CALLED TO: J ROBBINS,PHARMD@0300  06/25/22 MK    Streptococcus agalactiae NOT DETECTED NOT DETECTED Final   Streptococcus pneumoniae DETECTED (A) NOT DETECTED Final    Comment: CRITICAL RESULT CALLED TO, READ BACK BY AND VERIFIED WITH: J ROBBINS,PHARMD@0300  06/25/22 MK CORRECTED RESULTS GRAM POSITIVE COCCI IN CHAINS PREVIOUSLY REPORTED AS: GRAM POSITIVE RODS CORRECTED RESULTS CALLED TO: J ROBBINS,PHARMD@0300  06/25/22 MK    Streptococcus pyogenes NOT DETECTED NOT DETECTED Final   A.calcoaceticus-baumannii NOT DETECTED NOT DETECTED Final   Bacteroides fragilis NOT DETECTED NOT DETECTED Final   Enterobacterales NOT DETECTED NOT DETECTED Final  Enterobacter cloacae complex NOT DETECTED NOT DETECTED Final   Escherichia coli NOT DETECTED NOT DETECTED Final   Klebsiella aerogenes NOT DETECTED NOT DETECTED Final   Klebsiella oxytoca NOT DETECTED NOT DETECTED Final   Klebsiella pneumoniae NOT DETECTED NOT DETECTED Final   Proteus species NOT DETECTED NOT DETECTED Final   Salmonella species NOT DETECTED NOT DETECTED Final   Serratia marcescens NOT DETECTED NOT DETECTED Final   Haemophilus influenzae NOT DETECTED NOT DETECTED Final   Neisseria meningitidis NOT  DETECTED NOT DETECTED Final   Pseudomonas aeruginosa NOT DETECTED NOT DETECTED Final   Stenotrophomonas maltophilia NOT DETECTED NOT DETECTED Final   Candida albicans NOT DETECTED NOT DETECTED Final   Candida auris NOT DETECTED NOT DETECTED Final   Candida glabrata NOT DETECTED NOT DETECTED Final   Candida krusei NOT DETECTED NOT DETECTED Final   Candida parapsilosis NOT DETECTED NOT DETECTED Final   Candida tropicalis NOT DETECTED NOT DETECTED Final   Cryptococcus neoformans/gattii NOT DETECTED NOT DETECTED Final    Comment: Performed at [facility], [address] [place], [place] [address]  Resp Panel by RT-PCR (Flu A&B, Covid) Anterior Nasal Swab     Status: None   Collection Time: 06/22/2022 10:15 AM   Specimen: Anterior Nasal Swab  Result Value Ref Range Status   SARS Coronavirus 2 by RT PCR NEGATIVE NEGATIVE Final    Comment: (NOTE) SARS-CoV-2 target nucleic acids are NOT DETECTED.  The SARS-CoV-2 RNA is generally detectable in upper respiratory specimens during the acute phase of infection. The lowest concentration of SARS-CoV-2 viral copies this assay can detect is 138 copies/mL. A negative result does not preclude SARS-Cov-2 infection and should not be used as the sole basis for treatment or other patient management decisions. A negative result may occur with  improper specimen collection/handling, submission of specimen other than nasopharyngeal swab, presence of viral mutation(s) within the areas targeted by this assay, and inadequate number of viral copies(<138 copies/mL). A negative result must be combined with clinical observations, patient history, and epidemiological information. The expected result is Negative.  Fact Sheet for Patients:  BloggerCourse.com  Fact Sheet for Healthcare Providers:  SeriousBroker.it  This test is no t yet approved or cleared by the [place] FDA and  has been authorized for  detection and/or diagnosis of SARS-CoV-2 by FDA under an Emergency Use Authorization (EUA). This EUA will remain  in effect (meaning this test can be used) for the duration of the COVID-19 declaration under Section 564(b)(1) of the Act, 21 U.S.C.section 360bbb-3(b)(1), unless the authorization is terminated  or revoked sooner.       Influenza A by PCR NEGATIVE NEGATIVE Final   Influenza B by PCR NEGATIVE NEGATIVE Final    Comment: (NOTE) The Xpert Xpress SARS-CoV-2/FLU/RSV plus assay is intended as an aid in the diagnosis of influenza from Nasopharyngeal swab specimens and should not be used as a sole basis for treatment. Nasal washings and aspirates are unacceptable for Xpert Xpress SARS-CoV-2/FLU/RSV testing.  Fact Sheet for Patients: BloggerCourse.com  Fact Sheet for Healthcare Providers: SeriousBroker.it  This test is not yet approved or cleared by the [place] FDA and has been authorized for detection and/or diagnosis of SARS-CoV-2 by FDA under an Emergency Use Authorization (EUA). This EUA will remain in effect (meaning this test can be used) for the duration of the COVID-19 declaration under Section 564(b)(1) of the Act, 21 U.S.C. section 360bbb-3(b)(1), unless the authorization is terminated or revoked.  Performed at [facility],  [address] [place], [place] [address]   MRSA Next Gen by PCR, Nasal     Status: None   Collection Time: 06/02/2022  2:35 PM   Specimen: Nasal Mucosa; Nasal Swab  Result Value Ref Range Status   MRSA by PCR Next Gen NOT DETECTED NOT DETECTED Final    Comment: (NOTE) The GeneXpert MRSA Assay (FDA approved for NASAL specimens only), is one component of a comprehensive MRSA colonization surveillance program. It is not intended to diagnose MRSA infection nor to guide or monitor treatment for MRSA infections. Test performance is not FDA approved in patients less than 63  years old. Performed at [facility], [address] [place], [place] [address]    Pertinent Lab seen by me:    Latest Ref Rng & Units 06/25/2022    5:10 AM 06/10/2022   10:09 AM 05/07/2022   11:00 AM  CBC  WBC 4.0 - 10.5 K/uL 20.1  25.3  6.5   Hemoglobin 12.0 - 15.0 g/dL 9.5  09.8  11.9   Hematocrit 36.0 - 46.0 % 27.7  34.4  43.9   Platelets 150 - 400 K/uL 175  232  115       Latest Ref Rng & Units 06/25/2022    5:10 AM 06/25/2022    5:08 AM 06/27/2022    9:20 PM  CMP  Glucose 70 - 99 mg/dL  147  829   BUN 8 - 23 mg/dL  51  54   Creatinine 5.62 - 1.00 mg/dL  1.30  8.65   Sodium 784 - 145 mmol/L  135  133   Potassium 3.5 - 5.1 mmol/L  3.7  4.1   Chloride 98 - 111 mmol/L  98  99   CO2 22 - 32 mmol/L  25  23   Calcium 8.9 - 10.3 mg/dL  8.1  8.2   Total Protein 6.5 - 8.1 g/dL 6.2     Total Bilirubin 0.3 - 1.2 mg/dL 3.8     Alkaline Phos 38 - 126 U/L 142     AST 15 - 41 U/L 26     ALT 0 - 44 U/L 20        Pertinent Imagings/Other Imagings Plain films and CT images have been personally visualized and interpreted; radiology reports have been reviewed. Decision making incorporated into the Impression / Recommendations.  DG Chest Port 1 View  Result Date: 06/25/2022 CLINICAL DATA:  Chest tube. EXAM: PORTABLE CHEST 1 VIEW COMPARISON:  06/15/2022 FINDINGS: Right chest tube remains in place. Right base collapse/consolidation with effusion is similar to prior given differential positioning. Left lung clear. Interstitial markings are diffusely coarsened with chronic features. The cardio pericardial silhouette is enlarged. Telemetry leads overlie the chest. IMPRESSION: Right chest tube remains in place with persistent right base collapse/consolidation and effusion. Electronically Signed   By: Kennith Center M.D.   On: 06/25/2022 07:34   US Abdomen Limited RUQ (LIVER/GB)  Result Date: 06/19/2022 CLINICAL DATA:  Elevated LFTs EXAM: ULTRASOUND ABDOMEN LIMITED RIGHT UPPER QUADRANT  COMPARISON:  06/08/2022 FINDINGS: Gallbladder: No gallstones or wall thickening visualized. No sonographic Murphy sign noted by sonographer. Common bile duct: Diameter: 2 mm Liver: No focal lesion identified. Within normal limits in parenchymal echogenicity. Portal vein is patent on color Doppler imaging with normal direction of blood flow towards the liver. Other: Trace pericholecystic free fluid. Incidental small right pleural effusion similar to prior CT. IMPRESSION: 1. No evidence of cholelithiasis or cholecystitis. 2. Trace free fluid  right upper quadrant. 3. Small right pleural effusion. Electronically Signed   By: Sharlet Salina M.D.   On: 06/27/2022 20:22   DG Chest Port 1 View  Result Date: 06/03/2022 CLINICAL DATA:  Chest tube placement EXAM: PORTABLE CHEST 1 VIEW COMPARISON:  06/26/2022 FINDINGS: Interval placement of a right pleural pigtail drainage catheter projecting at the right lung base. There is some mild focal narrowing of the pigtail catheter along the right chest wall, without overt kinking. Surrounding residual pleural fluid and adjacent airspace opacity noted. No significant pneumothorax. Atherosclerotic calcification of the aortic arch. The left lung appears clear. The patient is rotated to the right on today's radiograph, reducing diagnostic sensitivity and specificity. IMPRESSION: 1. Interval placement of a right pleural pigtail drainage catheter, without pneumothorax. Mild focal narrowing of the pigtail catheter along the right extrathoracic chest wall, without overt kinking. 2. Atherosclerotic calcification of the aortic arch. Electronically Signed   By: Gaylyn Rong M.D.   On: 06/07/2022 19:50   CT CHEST ABDOMEN PELVIS WO CONTRAST  Result Date: 06/28/2022 CLINICAL DATA:  Fall 1 day ago, bruising to right ribs, fever and tachycardia history of marginal zone lymphoma * Tracking Code: BO * EXAM: CT CHEST, ABDOMEN AND PELVIS WITHOUT CONTRAST TECHNIQUE: Multidetector CT imaging  of the chest, abdomen and pelvis was performed following the standard protocol without IV contrast. RADIATION DOSE REDUCTION: This exam was performed according to the departmental dose-optimization program which includes automated exposure control, adjustment of the mA and/or kV according to patient size and/or use of iterative reconstruction technique. COMPARISON:  CT chest abdomen pelvis, 12/11/2021 FINDINGS: Examination is generally limited by breath motion artifact and lack of intravenous contrast. CT CHEST FINDINGS Cardiovascular: Aortic atherosclerosis. Normal heart size. No pericardial effusion. Mediastinum/Nodes: No enlarged mediastinal, hilar, or axillary lymph nodes. Thyroid gland, trachea, and esophagus demonstrate no significant findings. Lungs/Pleura: Moderate, loculated right pleural effusion with extensive associated atelectasis or consolidation. Bandlike scarring of the left lung base. Musculoskeletal: No chest wall abnormality. No acute osseous findings. CT ABDOMEN PELVIS FINDINGS Hepatobiliary: No solid liver abnormality is seen. Hepatomegaly, maximum coronal span 20.5 cm. No gallstones, gallbladder wall thickening, or biliary dilatation. Pancreas: Unremarkable. No pancreatic ductal dilatation or surrounding inflammatory changes. Spleen: Splenomegaly, maximum coronal span 14.2 cm. Adrenals/Urinary Tract: Adrenal glands are unremarkable. Kidneys are otherwise normal, without renal calculi, solid lesion, or hydronephrosis. Bladder is unremarkable. Stomach/Bowel: Stomach is within normal limits. Appendix appears normal. No evidence of bowel wall thickening, distention, or inflammatory changes. Vascular/Lymphatic: Aortic atherosclerosis. No significant change in retroperitoneal, iliac, inguinal, and mesenteric lymphadenopathy. Reproductive: Status post hysterectomy. Other: No abdominal hernia. Large contusion and hematoma of the left buttock (series 2, image 96). No ascites. Musculoskeletal: No acute  osseous findings. IMPRESSION: 1. Examination is generally limited by breath motion artifact and lack of intravenous contrast. 2. Moderate, loculated right pleural effusion with extensive associated atelectasis or consolidation. No overlying rib fracture. 3. Large contusion and hematoma of the left buttock. 4. No significant change in retroperitoneal, iliac, inguinal, and mesenteric lymphadenopathy, as well as splenomegaly, in keeping with history of lymphoma. 5. Hepatomegaly. Aortic Atherosclerosis (ICD10-I70.0). Electronically Signed   By: Jearld Lesch M.D.   On: 06/28/2022 11:53   CT Head Wo Contrast  Result Date: 06/19/2022 CLINICAL DATA:  Fall 1 day ago, facial trauma EXAM: CT HEAD WITHOUT CONTRAST CT CERVICAL SPINE WITHOUT CONTRAST TECHNIQUE: Multidetector CT imaging of the head and cervical spine was performed following the standard protocol without intravenous contrast. Multiplanar CT image reconstructions  of the cervical spine were also generated. RADIATION DOSE REDUCTION: This exam was performed according to the departmental dose-optimization program which includes automated exposure control, adjustment of the mA and/or kV according to patient size and/or use of iterative reconstruction technique. COMPARISON:  None Available. FINDINGS: CT HEAD FINDINGS Brain: No evidence of acute infarction, hemorrhage, hydrocephalus, extra-axial collection or mass lesion/mass effect. Periventricular and deep white matter hypodensity. Vascular: No hyperdense vessel or unexpected calcification. Skull: Normal. Negative for fracture or focal lesion. Sinuses/Orbits: No acute finding. Other: None. CT CERVICAL SPINE FINDINGS Alignment: Degenerative straightening and reversal of the normal cervical lordosis. Skull base and vertebrae: No acute fracture. No primary bone lesion or focal pathologic process. Soft tissues and spinal canal: No prevertebral fluid or swelling. No visible canal hematoma. Disc levels: Severe disc space  height loss and osteophytosis from C5 through C7 with otherwise relatively preserved disc spaces. Upper chest: Partially imaged right-sided pleural mass or fluid; please see forthcoming dedicated examination of the chest abdomen and pelvis. Other: None. IMPRESSION: 1. No acute intracranial pathology. Small-vessel white matter disease. 2. No fracture or static subluxation of the cervical spine. 3. Severe disc space height loss and osteophytosis from C5 through C7 with otherwise relatively preserved disc spaces. 4. Partially imaged right-sided pleural mass or fluid; please see forthcoming dedicated examination of the chest abdomen and pelvis. Electronically Signed   By: Jearld Lesch M.D.   On: 06/13/2022 11:37   CT Cervical Spine Wo Contrast  Result Date: 06/26/2022 CLINICAL DATA:  Fall 1 day ago, facial trauma EXAM: CT HEAD WITHOUT CONTRAST CT CERVICAL SPINE WITHOUT CONTRAST TECHNIQUE: Multidetector CT imaging of the head and cervical spine was performed following the standard protocol without intravenous contrast. Multiplanar CT image reconstructions of the cervical spine were also generated. RADIATION DOSE REDUCTION: This exam was performed according to the departmental dose-optimization program which includes automated exposure control, adjustment of the mA and/or kV according to patient size and/or use of iterative reconstruction technique. COMPARISON:  None Available. FINDINGS: CT HEAD FINDINGS Brain: No evidence of acute infarction, hemorrhage, hydrocephalus, extra-axial collection or mass lesion/mass effect. Periventricular and deep white matter hypodensity. Vascular: No hyperdense vessel or unexpected calcification. Skull: Normal. Negative for fracture or focal lesion. Sinuses/Orbits: No acute finding. Other: None. CT CERVICAL SPINE FINDINGS Alignment: Degenerative straightening and reversal of the normal cervical lordosis. Skull base and vertebrae: No acute fracture. No primary bone lesion or focal  pathologic process. Soft tissues and spinal canal: No prevertebral fluid or swelling. No visible canal hematoma. Disc levels: Severe disc space height loss and osteophytosis from C5 through C7 with otherwise relatively preserved disc spaces. Upper chest: Partially imaged right-sided pleural mass or fluid; please see forthcoming dedicated examination of the chest abdomen and pelvis. Other: None. IMPRESSION: 1. No acute intracranial pathology. Small-vessel white matter disease. 2. No fracture or static subluxation of the cervical spine. 3. Severe disc space height loss and osteophytosis from C5 through C7 with otherwise relatively preserved disc spaces. 4. Partially imaged right-sided pleural mass or fluid; please see forthcoming dedicated examination of the chest abdomen and pelvis. Electronically Signed   By: Jearld Lesch M.D.   On: 06/17/2022 11:37   DG Chest Portable 1 View  Result Date: 06/09/2022 CLINICAL DATA:  Sepsis. EXAM: PORTABLE CHEST 1 VIEW COMPARISON:  X-ray 05/26/2021 FINDINGS: Small right effusion with the adjacent opacities. No pneumothorax or edema. Stable cardiopericardial silhouette. Tortuous and ectatic aorta. IMPRESSION: Small right effusion with adjacent lung opacity. Acute infiltrates possible. Recommend  follow-up to confirm clearance Electronically Signed   By: Karen Kays M.D.   On: 05/31/2022 10:32    I have personally spent 85 minutes involved in face-to-face and non-face-to-face activities for this patient on the day of the visit. Professional time spent includes the following activities: Preparing to see the patient (review of tests), Obtaining and/or reviewing separately obtained history (admission/discharge record), Performing a medically appropriate examination and/or evaluation , Ordering medications/tests/procedures, referring and communicating with other health care professionals, Documenting clinical information in the EMR, Independently interpreting results (not separately  reported), Communicating results to the patient/family/caregiver, Counseling and educating the patient/family/caregiver and Care coordination (not separately reported).  Electronically signed by:   Plan d/w requesting provider as well as ID pharm D  Note: This document was prepared using dragon voice recognition software and may include unintentional dictation errors.   Odette Fraction, MD Infectious Disease Physician [facility] Pager: [phone]

## 2022-06-26 ENCOUNTER — Inpatient Hospital Stay: Payer: Medicare PPO

## 2022-06-26 ENCOUNTER — Inpatient Hospital Stay (HOSPITAL_COMMUNITY)
Admit: 2022-06-26 | Discharge: 2022-06-26 | Disposition: A | Discharge status: Home / Self Care | Payer: Medicare PPO | Attending: Infectious Diseases | Admitting: Infectious Diseases

## 2022-06-26 DIAGNOSIS — R7881 Bacteremia: Secondary | ICD-10-CM

## 2022-06-26 DIAGNOSIS — R4182 Altered mental status, unspecified: Secondary | ICD-10-CM

## 2022-06-26 DIAGNOSIS — R6520 Severe sepsis without septic shock: Secondary | ICD-10-CM | POA: Diagnosis not present

## 2022-06-26 DIAGNOSIS — A419 Sepsis, unspecified organism: Secondary | ICD-10-CM | POA: Diagnosis not present

## 2022-06-26 LAB — GLUCOSE, CAPILLARY
Glucose-Capillary: 134 mg/dL — ABNORMAL HIGH (ref 70–99)
Glucose-Capillary: 148 mg/dL — ABNORMAL HIGH (ref 70–99)
Glucose-Capillary: 196 mg/dL — ABNORMAL HIGH (ref 70–99)
Glucose-Capillary: 168 mg/dL — ABNORMAL HIGH (ref 70–99)
Glucose-Capillary: 171 mg/dL — ABNORMAL HIGH (ref 70–99)
Glucose-Capillary: 185 mg/dL — ABNORMAL HIGH (ref 70–99)

## 2022-06-26 LAB — RENAL FUNCTION PANEL
Albumin: 1.9 g/dL — ABNORMAL LOW (ref 3.5–5.0)
Anion gap: 14 (ref 5–15)
BUN: 59 mg/dL — ABNORMAL HIGH (ref 8–23)
CO2: 26 mmol/L (ref 22–32)
Calcium: 8.3 mg/dL — ABNORMAL LOW (ref 8.9–10.3)
Chloride: 100 mmol/L (ref 98–111)
Creatinine, Ser: 1.5 mg/dL — ABNORMAL HIGH (ref 0.44–1.00)
GFR, Estimated: 35 mL/min — ABNORMAL LOW (ref >60)
Glucose, Bld: 147 mg/dL — ABNORMAL HIGH (ref 70–99)
Phosphorus: 4.6 mg/dL (ref 2.5–4.6)
Potassium: 4.3 mmol/L (ref 3.5–5.1)
Sodium: 140 mmol/L (ref 135–145)

## 2022-06-26 LAB — CSF CELL COUNT WITH DIFFERENTIAL
Eosinophils, CSF: 0 %
Lymphs, CSF: 11 %
Monocyte-Macrophage-Spinal Fluid: 8 %
Other Cells, CSF: 2
RBC Count, CSF: 10349 /mm3 — ABNORMAL HIGH (ref 0–3)
Segmented Neutrophils-CSF: 79 %
Tube #: 3
WBC, CSF: 6136 /mm3 (ref 0–5)

## 2022-06-26 LAB — MENINGITIS/ENCEPHALITIS PANEL (CSF)
Cryptococcus neoformans/gattii (CSF): NOT DETECTED
Cytomegalovirus (CSF): NOT DETECTED
Enterovirus (CSF): NOT DETECTED
Escherichia coli K1 (CSF): NOT DETECTED
Haemophilus influenzae (CSF): NOT DETECTED
Herpes simplex virus 1 (CSF): NOT DETECTED
Herpes simplex virus 2 (CSF): NOT DETECTED
Human herpesvirus 6 (CSF): NOT DETECTED
Human parechovirus (CSF): NOT DETECTED
Listeria monocytogenes (CSF): NOT DETECTED
Neisseria meningitis (CSF): NOT DETECTED
Streptococcus agalactiae (CSF): NOT DETECTED
Streptococcus pneumoniae (CSF): DETECTED — AB
Varicella zoster virus (CSF): NOT DETECTED

## 2022-06-26 LAB — CBC
HCT: 29.8 % — ABNORMAL LOW (ref 36.0–46.0)
Hemoglobin: 10.1 g/dL — ABNORMAL LOW (ref 12.0–15.0)
MCH: 30.4 pg (ref 26.0–34.0)
MCHC: 33.9 g/dL (ref 30.0–36.0)
MCV: 89.8 fL (ref 80.0–100.0)
Platelets: 237 10*3/uL (ref 150–400)
RBC: 3.32 MIL/uL — ABNORMAL LOW (ref 3.87–5.11)
RDW: 13.6 % (ref 11.5–15.5)
WBC: 24.8 10*3/uL — ABNORMAL HIGH (ref 4.0–10.5)
nRBC: 0 % (ref 0.0–0.2)

## 2022-06-26 LAB — HEPATIC FUNCTION PANEL
ALT: 19 U/L (ref 0–44)
AST: 21 U/L (ref 15–41)
Albumin: 1.9 g/dL — ABNORMAL LOW (ref 3.5–5.0)
Alkaline Phosphatase: 139 U/L — ABNORMAL HIGH (ref 38–126)
Bilirubin, Direct: 1.6 mg/dL — ABNORMAL HIGH (ref 0.0–0.2)
Indirect Bilirubin: 1.3 mg/dL — ABNORMAL HIGH (ref 0.3–0.9)
Total Bilirubin: 2.9 mg/dL — ABNORMAL HIGH (ref 0.3–1.2)
Total Protein: 6.6 g/dL (ref 6.5–8.1)

## 2022-06-26 LAB — VITAMIN B12: Vitamin B-12: 2440 pg/mL — ABNORMAL HIGH (ref 180–914)

## 2022-06-26 LAB — FOLATE: Folate: 12.2 ng/mL (ref >5.9)

## 2022-06-26 LAB — BLOOD GAS, VENOUS
Acid-Base Excess: 6.6 mmol/L — ABNORMAL HIGH (ref 0.0–2.0)
Bicarbonate: 28.9 mmol/L — ABNORMAL HIGH (ref 20.0–28.0)
O2 Saturation: 76.9 %
Patient temperature: 37
pCO2, Ven: 33 mmHg — ABNORMAL LOW (ref 44–60)
pH, Ven: 7.55 — ABNORMAL HIGH (ref 7.25–7.43)
pO2, Ven: 41 mmHg (ref 32–45)

## 2022-06-26 LAB — ECHOCARDIOGRAM COMPLETE
Area-P 1/2: 5.54 cm2
Height: 63 in
S' Lateral: 2.1 cm
Weight: 2465.62 oz

## 2022-06-26 LAB — PROCALCITONIN: Procalcitonin: 14.75 ng/mL

## 2022-06-26 LAB — PROTEIN AND GLUCOSE, CSF
Glucose, CSF: <20 mg/dL — CL (ref 40–70)
Total  Protein, CSF: 461 mg/dL — ABNORMAL HIGH (ref 15–45)

## 2022-06-26 LAB — BRAIN NATRIURETIC PEPTIDE: B Natriuretic Peptide: 120.9 pg/mL — ABNORMAL HIGH (ref 0.0–100.0)

## 2022-06-26 LAB — VANCOMYCIN, RANDOM: Vancomycin Rm: 15 ug/mL

## 2022-06-26 LAB — MAGNESIUM: Magnesium: 2.6 mg/dL — ABNORMAL HIGH (ref 1.7–2.4)

## 2022-06-26 LAB — CULTURE, BLOOD (ROUTINE X 2)

## 2022-06-26 MED ORDER — ACETAMINOPHEN 10 MG/ML IV SOLN
1000.0000 mg | Freq: Four times a day (QID) | INTRAVENOUS | Status: AC | PRN
  Administered 2022-06-26 – 2022-06-27 (×3): 1000 mg via INTRAVENOUS
  Filled 2022-06-26 (×3): qty 100

## 2022-06-26 MED ORDER — VANCOMYCIN HCL IN DEXTROSE 1-5 GM/200ML-% IV SOLN
1000.0000 mg | Freq: Once | INTRAVENOUS | Status: AC
  Administered 2022-06-26: 1000 mg via INTRAVENOUS
  Filled 2022-06-26: qty 200

## 2022-06-26 MED ORDER — LACTATED RINGERS IV SOLN: INTRAVENOUS | Status: DC

## 2022-06-26 MED ORDER — SODIUM CHLORIDE 0.9 % IV SOLN
2.0000 g | Freq: Two times a day (BID) | INTRAVENOUS | Status: DC
  Administered 2022-06-26 – 2022-06-27 (×3): 2 g via INTRAVENOUS
  Filled 2022-06-26 (×2): qty 2
  Filled 2022-06-26: qty 20

## 2022-06-26 MED ORDER — IPRATROPIUM-ALBUTEROL 0.5-2.5 (3) MG/3ML IN SOLN
3.0000 mL | Freq: Four times a day (QID) | RESPIRATORY_TRACT | Status: DC
  Administered 2022-06-26 – 2022-06-28 (×7): 3 mL via RESPIRATORY_TRACT
  Filled 2022-06-26 (×8): qty 3

## 2022-06-26 MED ORDER — DEXTROSE 5 % IV SOLN
10.0000 mg/kg | Freq: Two times a day (BID) | INTRAVENOUS | Status: DC
  Administered 2022-06-26: 700 mg via INTRAVENOUS
  Filled 2022-06-26 (×2): qty 14

## 2022-06-26 MED ORDER — ACETAMINOPHEN 10 MG/ML IV SOLN
1000.0000 mg | Freq: Once | INTRAVENOUS | Status: AC
  Administered 2022-06-26: 1000 mg via INTRAVENOUS
  Filled 2022-06-26: qty 100

## 2022-06-26 MED ORDER — FUROSEMIDE 10 MG/ML IJ SOLN
40.0000 mg | Freq: Once | INTRAMUSCULAR | Status: AC
  Administered 2022-06-26: 40 mg via INTRAVENOUS
  Filled 2022-06-26: qty 4

## 2022-06-26 NOTE — Progress Notes (Addendum)
ID brief note ( remotely)  T max 102.9 Encephalopathic per notes  Repeat blood cx 4/27 NG in < 24 hrs  On Vancomycin, metronidazole, ceftriaxone + acyclovir   MRI brain 4/26 unremarkable for acute changes  EEG 4/27 - no epileptiform abnormalities seen  Undergoing LP today by IR to r/o step meningitis   Patient has been started on IV ceftriaxone as well dexamethasone by PCCM to cover for strep pneumo meningitis as well as acyclovir empirically. 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 [place], [place] [address] Phone: [phone]  Fax: [phone]

## 2022-06-26 NOTE — Progress Notes (Signed)
Ice pack placed to back of neck and under arms bilaterally to help with fever. Annabelle Harman DNP aware. Medications ordered

## 2022-06-26 NOTE — Progress Notes (Signed)
Placed ice packs under arms to help with fever. Annabelle Harman DNP aware. Medications ordered.

## 2022-06-26 NOTE — Progress Notes (Signed)
To fluro for LP

## 2022-06-26 NOTE — Procedures (Signed)
Technically successful fluoro guided LP at L4-L5 level with opening pressure of 21 cm H2O and closing pressure of 11 H2O 18 cc of cloudy, amber CSF sent to lab for analysis.  No immediate post procedural complication.  Please see imaging section of Epic for full dictation.    Alex Gardener, AGNP-BC 06/26/2022, 1:16 PM

## 2022-06-26 NOTE — Procedures (Signed)
Routine EEG Report  Sherry Mosley is a 77 y.o. female with a history of altered mental statuys who is undergoing an EEG to evaluate for seizures.  Report: This EEG was acquired with electrodes placed according to the International 10-20 electrode system (including Fp1, Fp2, F3, F4, C3, C4, P3, P4, O1, O2, T3, T4, T5, T6, A1, A2, Fz, Cz, Pz). The following electrodes were missing or displaced: none.  The occipital dominant rhythm was 6-7 Hz. This activity is reactive to stimulation. Drowsiness was manifested by background fragmentation; deeper stages of sleep were identified by K complexes and sleep spindles. There was focal right-sided slowing. There were no interictal epileptiform discharges. There were no electrographic seizures identified. There was no abnormal response to photic stimulation or hyperventilation.   Impression and clinical correlation: This EEG was obtained while awake and asleep and is abnormal due to: - mild diffuse slowing indicative of global cerebral dysfunction - Right focal slowing indicative of superimposed focal cerebral dysfunction in that area  Epileptiform abnormalities were not seen during this recording.  Bing Neighbors, MD [facility] [phone]  If 7pm- 7am, please page neurology on call as listed in AMION.

## 2022-06-26 NOTE — Progress Notes (Signed)
Patient tolerated procedure well. NRB through out.  Return to room 10. Linens changed, Bath given. Patient tolerated all well. Rectal temp probe placed after procedure.

## 2022-06-26 NOTE — Progress Notes (Signed)
PHARMACY - PHYSICIAN COMMUNICATION CRITICAL VALUE ALERT - BLOOD CULTURE IDENTIFICATION (BCID)  Results for orders placed or performed during the hospital encounter of 06/28/2022  Culture, blood (Routine x 2)     Status: Abnormal (Preliminary result)   Collection Time: 06/19/2022 10:09 AM   Specimen: BLOOD LEFT FOREARM  Result Value Ref Range Status   Specimen Description   Final    BLOOD LEFT FOREARM Performed at [facility], [address], [place], [place] [address]    Special Requests   Final    BOTTLES DRAWN AEROBIC ONLY Blood Culture results may not be optimal due to an inadequate volume of blood received in culture bottles Performed at [facility], [address], [place], [place] [address]    Culture  Setup Time   Final    GRAM POSITIVE RODS AEROBIC BOTTLE ONLY CRITICAL VALUE NOTED.  VALUE IS CONSISTENT WITH PREVIOUSLY REPORTED AND CALLED VALUE. SKL CORRECTED RESULTS GRAM POSITIVE COCCI IN CHAINS PREVIOUSLY REPORTED AS: GRAM POSITIVE RODS CORRECTED RESULTS CALLED TO: J ROBBINS,PHARMD@0300  06/25/22 MK Performed at [facility], [address] [place], [place] [address]    Culture STREPTOCOCCUS PNEUMONIAE (A)  Final   Report Status PENDING  Incomplete  Culture, blood (Routine x 2)     Status: Abnormal (Preliminary result)   Collection Time: 06/27/2022 10:09 AM   Specimen: Right Antecubital; Blood  Result Value Ref Range Status   Specimen Description   Final    RIGHT ANTECUBITAL Performed at [facility], [address] [place], [place] [address]    Special Requests   Final    BOTTLES DRAWN AEROBIC AND ANAEROBIC Blood Culture results may not be optimal due to an excessive volume of blood received in culture bottles Performed at [facility], [address] [place], [place] [address]    Culture  Setup Time   Final    GRAM POSITIVE RODS IN BOTH AEROBIC AND ANAEROBIC BOTTLES CRITICAL RESULT CALLED TO, READ BACK BY AND VERIFIED  WITH: Cheron Every @2048  on 06/01/2022 skl CORRECTED RESULTS GRAM POSITIVE COCCI IN CHAINS PREVIOUSLY REPORTED AS: GRAM POSITIVE RODS CORRECTED RESULTS CALLED TO: J ROBBINS,PHARMD@0300  06/25/22 MK    Culture (A)  Final    STREPTOCOCCUS PNEUMONIAE SUSCEPTIBILITIES TO FOLLOW Performed at [facility], [address], [place], [place] [address]    Report Status PENDING  Incomplete  Respiratory (~20 pathogens) panel by PCR     Status: None   Collection Time: 06/28/2022 10:09 AM   Specimen: Nasopharyngeal Swab; Respiratory  Result Value Ref Range Status   Adenovirus NOT DETECTED NOT DETECTED Final   Coronavirus 229E NOT DETECTED NOT DETECTED Final    Comment: (NOTE) The Coronavirus on the Respiratory Panel, DOES NOT test for the novel  Coronavirus (2019 nCoV)    Coronavirus HKU1 NOT DETECTED NOT DETECTED Final   Coronavirus NL63 NOT DETECTED NOT DETECTED Final   Coronavirus OC43 NOT DETECTED NOT DETECTED Final   Metapneumovirus NOT DETECTED NOT DETECTED Final   Rhinovirus / Enterovirus NOT DETECTED NOT DETECTED Final   Influenza A NOT DETECTED NOT DETECTED Final   Influenza B NOT DETECTED NOT DETECTED Final   Parainfluenza Virus 1 NOT DETECTED NOT DETECTED Final   Parainfluenza Virus 2 NOT DETECTED NOT DETECTED Final   Parainfluenza Virus 3 NOT DETECTED NOT DETECTED Final   Parainfluenza Virus 4 NOT DETECTED NOT DETECTED Final   Respiratory Syncytial Virus NOT DETECTED NOT DETECTED Final   Bordetella pertussis NOT DETECTED  NOT DETECTED Final   Bordetella Parapertussis NOT DETECTED NOT DETECTED Final   Chlamydophila pneumoniae NOT DETECTED NOT DETECTED Final   Mycoplasma pneumoniae NOT DETECTED NOT DETECTED Final    Comment: Performed at [facility], [address], [place], [place] [address]  Blood Culture ID Panel (Reflexed)     Status: Abnormal   Collection Time: 06/07/2022 10:09 AM  Result Value Ref Range Status   Enterococcus faecalis NOT DETECTED NOT DETECTED Final    Enterococcus Faecium NOT DETECTED NOT DETECTED Final   Listeria monocytogenes NOT DETECTED NOT DETECTED Final   Staphylococcus species NOT DETECTED NOT DETECTED Final   Staphylococcus aureus (BCID) NOT DETECTED NOT DETECTED Final   Staphylococcus epidermidis NOT DETECTED NOT DETECTED Final   Staphylococcus lugdunensis NOT DETECTED NOT DETECTED Final   Streptococcus species DETECTED (A) NOT DETECTED Final    Comment: CRITICAL RESULT CALLED TO, READ BACK BY AND VERIFIED WITH: J ROBBINS,PHARMD@0300  06/25/22 MK CORRECTED RESULTS GRAM POSITIVE COCCI IN CHAINS PREVIOUSLY REPORTED AS: GRAM POSITIVE RODS CORRECTED RESULTS CALLED TO: J ROBBINS,PHARMD@0300  06/25/22 MK    Streptococcus agalactiae NOT DETECTED NOT DETECTED Final   Streptococcus pneumoniae DETECTED (A) NOT DETECTED Final    Comment: CRITICAL RESULT CALLED TO, READ BACK BY AND VERIFIED WITH: J ROBBINS,PHARMD@0300  06/25/22 MK CORRECTED RESULTS GRAM POSITIVE COCCI IN CHAINS PREVIOUSLY REPORTED AS: GRAM POSITIVE RODS CORRECTED RESULTS CALLED TO: J ROBBINS,PHARMD@0300  06/25/22 MK    Streptococcus pyogenes NOT DETECTED NOT DETECTED Final   A.calcoaceticus-baumannii NOT DETECTED NOT DETECTED Final   Bacteroides fragilis NOT DETECTED NOT DETECTED Final   Enterobacterales NOT DETECTED NOT DETECTED Final   Enterobacter cloacae complex NOT DETECTED NOT DETECTED Final   Escherichia coli NOT DETECTED NOT DETECTED Final   Klebsiella aerogenes NOT DETECTED NOT DETECTED Final   Klebsiella oxytoca NOT DETECTED NOT DETECTED Final   Klebsiella pneumoniae NOT DETECTED NOT DETECTED Final   Proteus species NOT DETECTED NOT DETECTED Final   Salmonella species NOT DETECTED NOT DETECTED Final   Serratia marcescens NOT DETECTED NOT DETECTED Final   Haemophilus influenzae NOT DETECTED NOT DETECTED Final   Neisseria meningitidis NOT DETECTED NOT DETECTED Final   Pseudomonas aeruginosa NOT DETECTED NOT DETECTED Final   Stenotrophomonas maltophilia  NOT DETECTED NOT DETECTED Final   Candida albicans NOT DETECTED NOT DETECTED Final   Candida auris NOT DETECTED NOT DETECTED Final   Candida glabrata NOT DETECTED NOT DETECTED Final   Candida krusei NOT DETECTED NOT DETECTED Final   Candida parapsilosis NOT DETECTED NOT DETECTED Final   Candida tropicalis NOT DETECTED NOT DETECTED Final   Cryptococcus neoformans/gattii NOT DETECTED NOT DETECTED Final    Comment: Performed at [facility], [address] [place], [place] [address]  Resp Panel by RT-PCR (Flu A&B, Covid) Anterior Nasal Swab     Status: None   Collection Time: 06/02/2022 10:15 AM   Specimen: Anterior Nasal Swab  Result Value Ref Range Status   SARS Coronavirus 2 by RT PCR NEGATIVE NEGATIVE Final    Comment: (NOTE) SARS-CoV-2 target nucleic acids are NOT DETECTED.  The SARS-CoV-2 RNA is generally detectable in upper respiratory specimens during the acute phase of infection. The lowest concentration of SARS-CoV-2 viral copies this assay can detect is 138 copies/mL. A negative result does not preclude SARS-Cov-2 infection and should not be used as the sole basis for treatment or other patient management decisions. A negative result may occur with  improper specimen collection/handling, submission of specimen other than nasopharyngeal swab, presence  of viral mutation(s) within the areas targeted by this assay, and inadequate number of viral copies(<138 copies/mL). A negative result must be combined with clinical observations, patient history, and epidemiological information. The expected result is Negative.  Fact Sheet for Patients:  BloggerCourse.com  Fact Sheet for Healthcare Providers:  SeriousBroker.it  This test is no t yet approved or cleared by the [place] FDA and  has been authorized for detection and/or diagnosis of SARS-CoV-2 by FDA under an Emergency Use Authorization (EUA). This EUA will remain  in  effect (meaning this test can be used) for the duration of the COVID-19 declaration under Section 564(b)(1) of the Act, 21 U.S.C.section 360bbb-3(b)(1), unless the authorization is terminated  or revoked sooner.       Influenza A by PCR NEGATIVE NEGATIVE Final   Influenza B by PCR NEGATIVE NEGATIVE Final    Comment: (NOTE) The Xpert Xpress SARS-CoV-2/FLU/RSV plus assay is intended as an aid in the diagnosis of influenza from Nasopharyngeal swab specimens and should not be used as a sole basis for treatment. Nasal washings and aspirates are unacceptable for Xpert Xpress SARS-CoV-2/FLU/RSV testing.  Fact Sheet for Patients: BloggerCourse.com  Fact Sheet for Healthcare Providers: SeriousBroker.it  This test is not yet approved or cleared by the [place] FDA and has been authorized for detection and/or diagnosis of SARS-CoV-2 by FDA under an Emergency Use Authorization (EUA). This EUA will remain in effect (meaning this test can be used) for the duration of the COVID-19 declaration under Section 564(b)(1) of the Act, 21 U.S.C. section 360bbb-3(b)(1), unless the authorization is terminated or revoked.  Performed at [facility], [address] [place], [place] [address]   MRSA Next Gen by PCR, Nasal     Status: None   Collection Time: 06/10/2022  2:35 PM   Specimen: Nasal Mucosa; Nasal Swab  Result Value Ref Range Status   MRSA by PCR Next Gen NOT DETECTED NOT DETECTED Final    Comment: (NOTE) The GeneXpert MRSA Assay (FDA approved for NASAL specimens only), is one component of a comprehensive MRSA colonization surveillance program. It is not intended to diagnose MRSA infection nor to guide or monitor treatment for MRSA infections. Test performance is not FDA approved in patients less than 46 years old. Performed at [facility], [address] [place], [place] [address]   Eye culture w Gram Stain      Status: None (Preliminary result)   Collection Time: 06/25/22  3:39 PM   Specimen: Wound; Eye  Result Value Ref Range Status   Specimen Description   Final    WOUND Performed at [facility], [address] [place], [place] [address]    Special Requests   Final    RIGHT EYE Performed at [facility], [address] [place], [place] [address]    Gram Stain NO WBC SEEN NO ORGANISMS SEEN   Final   Culture   Final    NO GROWTH < 24 HOURS Performed at [facility], [address], [place], [place] [address]    Report Status PENDING  Incomplete  Culture, blood (Routine X 2) w Reflex to ID Panel     Status: None (Preliminary result)   Collection Time: 06/26/22  3:58 AM   Specimen: BLOOD  Result Value Ref Range Status   Specimen Description BLOOD LEFT ANTECUBITAL  Final   Special Requests   Final    BOTTLES DRAWN AEROBIC ONLY Blood Culture results may not be optimal  due to an inadequate volume of blood received in culture bottles   Culture   Final    NO GROWTH <12 HOURS Performed at [facility], [address] [place], [place] [address]    Report Status PENDING  Incomplete  Culture, blood (Routine X 2) w Reflex to ID Panel     Status: None (Preliminary result)   Collection Time: 06/26/22  4:00 AM   Specimen: BLOOD  Result Value Ref Range Status   Specimen Description BLOOD BLOOD LEFT HAND  Final   Special Requests   Final    BOTTLES DRAWN AEROBIC ONLY Blood Culture results may not be optimal due to an inadequate volume of blood received in culture bottles   Culture   Final    NO GROWTH <12 HOURS Performed at [facility], [address] [place], [place] [address]    Report Status PENDING  Incomplete  CSF culture w Gram Stain     Status: None (Preliminary result)   Collection Time: 06/26/22 12:59 PM   Specimen: PATH Cytology CSF; Cerebrospinal Fluid  Result Value Ref Range Status   Specimen Description   Final     CSF Performed at [facility], [address], [place], [place] [address]    Special Requests   Final    NONE Performed at [facility], [address] [place], [place] [address]    Gram Stain   Final    CYTOSPIN SMEAR RED BLOOD CELLS PRESENT WBC PRESENT, PREDOMINANTLY PMN GRAM POSITIVE COCCI IN PAIRS CORRECTED RESULTS PREVIOUSLY REPORTED AS: NO ORGANISMS SEEN CRITICAL RESULT CALLED TO, READ BACK BY AND VERIFIED WITH:  Oralia Manis, RN 06/26/22 2010 A. LAFRANCE Performed at [facility], [address], [place], [place] [address]    Culture PENDING  Incomplete   Report Status PENDING  Incomplete    ME Results: CSF w/ Strep pneumoniae   Name of provider contacted: Reyes Ivan, NP   Changes to prescribed antibiotics required: D/C Acyclovir, continue Abx  Otelia Sergeant, PharmD, [facility] 06/26/2022 8:40 PM

## 2022-06-26 NOTE — Progress Notes (Signed)
NAME:  Sherry Mosley, MRN:  [id number], DOB:  25-Apr-1945, LOS: 2 ADMISSION DATE:  06/16/2022, CONSULTATION DATE:  06/07/2022 REFERRING MD:  Dr. Fanny Bien, CHIEF COMPLAINT:  Altered Mental Status   Brief Pt Description / Synopsis:  77 y.o. female with PMHx significant for Non Hodgkin's Lymphoma, who is admitted with Acute Metabolic Encephalopathy and Acute Hypoxic Respiratory Failure due to Severe Sepsis from STREP PNEUMO BACTEREMIA in setting of Right sided Pneumonia and loculated right sided pleural effusion, along with Acute Kidney Injury and Anion Gap metabolic acidosis.  History of Present Illness:  Sherry Mosley is a 77 year old female with a past medical history significant for non-Hodgkin's lymphoma, hypertension, hyperlipidemia who presents to [facility] ED on 06/05/2022 due to altered mental status.  Patient is currently altered and unable to contribute to history, therefore history is obtained from patient's husband at bedside along with chart review.  Per the patient's husband, she was seen a couple days ago by her ophthalmologist and diagnosed with conjunctivitis and was prescribed antibiotic drops.  He also reports that she was complaining of soreness with her right ribs, however she did not want to seek medical care at that time.  He reports she has had increasing fatigue and weakness along with multiple falls.  The night before last she fell onto her buttocks, did not hit her head, and he was able to get her back to bed.  This morning she was extremely confused prompting him to contact EMS for further medical evaluation.  ED Course: Initial Vital Signs: Pulse 151, respiratory rate 36, blood pressure 144/84, SpO2 100% on BiPAP, temperature 101.2 F rectally Significant Labs: Sodium 133, bicarb 17, anion gap 16, BUN 52, creatinine 2.25, glucose 218, alkaline phosphatase 197, total bilirubin 3.9, albumin 2.2, high-sensitivity troponin 49, lactic acid 4.5 (later trended down to 2.0), procalcitonin 4, WBC  25.3 with neutrophilia, hemoglobin 11.9, hematocrit 34.4 VBG: pH 7.43/pCO2 28/pO2 less than 31/bicarb 18.6 COVID-19 PCR is negative Urinalysis is negative for UTI EKG: EKG time: 1010 heart rate 150 QRS 80 QTc 460 Sinus tachycardia.  Nonspecific ST abnormality, no frank ischemia.  Possible rate related.  Cannot rule out ischemia Imaging: Chest X-ray>>Small right effusion with adjacent lung opacity. Acute infiltrates possible. Recommend follow-up to confirm clearance CT chest/abdomen/pelvis>>Examination is generally limited by breath motion artifact and lack of intravenous contrast. Moderate, loculated right pleural effusion with extensive associated atelectasis or consolidation. No overlying rib fracture. Large contusion and hematoma of the left buttock. No significant change in retroperitoneal, iliac, inguinal, and mesenteric lymphadenopathy, as well as splenomegaly, in keeping with history of lymphoma. Hepatomegaly. Aortic Atherosclerosis CT Head/Cervical Spine>>No acute intracranial pathology. Small-vessel white matter disease. No fracture or static subluxation of the cervical spine. Severe disc space height loss and osteophytosis from C5 through C7 with otherwise relatively preserved disc spaces. Partially imaged right-sided pleural mass or fluid; please see forthcoming dedicated examination of the chest abdomen and pelvis. Medications Administered: 1.725 L of LR boluses, IV aztreonam, Flagyl, and vancomycin  She met sepsis criteria therefore she was given IV fluid resuscitation along with broad-spectrum antibiotics.  Blood cultures were drawn.  Patient's husband at bedside and her son who is an ED physician in [place] both verify that patient is to be a DNR/DNI given her previous wishes.  PCCM is asked to admit for further workup and treatment.  Please see "significant hospital events" section below for full detailed hospital course.  Pertinent  Medical History   Past Medical History:   Diagnosis Date  Generalized osteoarthritis    H/O benign breast biopsy 1964   H/O bilateral cataract extraction 2005   Hyperlipidemia    Hypertension    Mood disorder (HCC)    Non Hodgkin's lymphoma (HCC)    Pancreatitis 10/20/2012   unknown etiology   Postmenopausal    S/P ankle arthrodesis 2012   Right   Sleep disorder    Uterine fibroid    Micro Data:  4/25: SARS-CoV-2 PCR>> negative 4/25: Respiratory viral panel>>negative 4/25: Blood culture x 2>>Streptococcus pneumoniae  4/25: Strep pneumo urinary antigen>> 4/25: Legionella urine antigen>> 4/25: Pleural fluid>> 4/25: MRSA PCR>>negative  Antimicrobials:   Anti-infectives (From admission, onward)    Start     Dose/Rate Route Frequency Ordered Stop   06/25/22 1930  ceFEPIme (MAXIPIME) 2 g in sodium chloride 0.9 % 100 mL IVPB        2 g 200 mL/hr over 30 Minutes Intravenous Every 24 hours 06/25/22 1829     06/25/22 1630  vancomycin (VANCOCIN) IVPB 1000 mg/200 mL premix        1,000 mg 200 mL/hr over 60 Minutes Intravenous  Once 06/25/22 1539 06/26/22 0455   06/15/2022 2300  metroNIDAZOLE (FLAGYL) IVPB 500 mg        500 mg 100 mL/hr over 60 Minutes Intravenous Every 12 hours 06/10/2022 2121     06/11/2022 2200  aztreonam (AZACTAM) 1 g in sodium chloride 0.9 % 100 mL IVPB  Status:  Discontinued        1 g 200 mL/hr over 30 Minutes Intravenous Every 8 hours 06/01/2022 1302 06/25/22 0336   06/05/2022 1257  vancomycin variable dose per unstable renal function (pharmacist dosing)         Does not apply See admin instructions 06/09/2022 1302     06/23/2022 1245  metroNIDAZOLE (FLAGYL) IVPB 500 mg        500 mg 100 mL/hr over 60 Minutes Intravenous  Once 06/07/2022 1242 06/22/2022 1349   06/10/2022 1100  vancomycin (VANCOREADY) IVPB 1500 mg/300 mL        1,500 mg 150 mL/hr over 120 Minutes Intravenous  Once 06/28/2022 1032 06/10/2022 1338   06/09/2022 1030  aztreonam (AZACTAM) 2 g in sodium chloride 0.9 % 100 mL IVPB        2 g 200 mL/hr over  30 Minutes Intravenous  Once 06/06/2022 1021 06/19/2022 1133   06/22/2022 1030  vancomycin (VANCOCIN) IVPB 1000 mg/200 mL premix  Status:  Discontinued        1,000 mg 200 mL/hr over 60 Minutes Intravenous  Once 06/03/2022 1021 06/04/2022 1032       Significant Hospital Events: Including procedures, antibiotic start and stop dates in addition to other pertinent events   4/25: Presented to ED with altered mental status and acute respiratory failure requiring BiPAP.  Patient is DNR/DNI per her previous wishes.  Critically ill with multiorgan failure, PCCM asked to admit.  IR placed chest tube for loculated right-sided pleural effusion. 4/26: Blood cultures with Strep Pneumo, consult ID.  Pleural fluid consistent Exudative effusion, pleural culture still pending. Chest tube with minimal drainage, plan to instill tpa/dornase.  AKI slowly improving, respiratory status improving, weaned off BiPAP to [place].  Remains encephalopathic, EEG pending 4/27: Pt remains encephalopathic unable to follow commands.  On HHFNC FiO2 @50 %.  EEG results pending.  Pt febrile temp 102.9.  Pt pending LP per IR.  Will start acyclovir given lack of improvement in mentation    Interim History / Subjective:  As  outlined above   Objective   Blood pressure (!) 171/88, pulse (!) 129, temperature (!) 101 F (38.3 C), temperature source Axillary, resp. rate (!) 33, height 5\' 3"  (1.6 m), weight 69.9 kg, SpO2 96 %.    FiO2 (%):  [45 %-55 %] 55 %   Intake/Output Summary (Last 24 hours) at 06/26/2022 1610 Last data filed at 06/26/2022 0551 Gross per 24 hour  Intake 2291.62 ml  Output 1479 ml  Net 812.62 ml   Filed Weights   06/21/2022 1046 06/25/2022 1430 06/25/22 0500  Weight: 69 kg 67.7 kg 69.9 kg    Examination: General: Acutely on chronically-ill appearing female, laying in bed, NAD on HHFNC @50 % HENT: Atraumatic, normocephalic, neck supple, no JVD, conjunctivitis to right eye (see images below) Lungs: Diminished throughout, even,  nonlabored Cardiovascular: Sinus tachycardia, S1-S2, no m/rg, 2+ radial/1+ distal pulses, no edema Abdomen: Obese, soft, non distended, no guarding or rebound tenderness, bowel sounds positive x 4 Extremities: Normal bulk and tone Neuro: Obtunded, not following commands, bilateral pupils reactive but sluggish, conjunctivitis of right eye   GU: Foley catheter in place draining yellow urine Skin: Scattered ecchymosis with large bruising to right buttock/hip (see images below)          Resolved Hospital Problem list   AG Metabolic Acidosis Hyponatremia   Assessment & Plan:   #Acute Hypoxic Respiratory Failure in the setting of Pneumonia & Right sided Loculated Pleural Effusion - Supplemental O2 or Bipap as needed to maintain O2 sats >92% - Follow intermittent Chest X-ray & ABG as needed - Bronchodilators  - ABX as above - Continue chest tube: will instill tPA and dornase today 06/26/22 - Pleural fluid consistent with EXUDATE, culture still pending differentiate b/w Parapneumonic effusion vs. Empyema  - Pulmonary toilet as able  #Severe Sepsis in setting of STREPTOCOCCUS PNEUMONIAE BACTEREMIA due to Pneumonia & Loculated pleural effusion (Meets SIRS criteria at admission: Temp. 101.2, RR 37, HR 153, WBC 25.3, Lactic 4.5) #Right eye Conjunctivitis (present on admission, was being treat outpatient) - Trend WBC and monitor fever curve - Trend PCT  - Follow cultures  - Will start acyclovir given no improvement in mentation  - HSV 1+2 ab-IgG pending  - Continue decadron  - ID consulted appreciate input  - LP scheduled for today 04/27  #Mildly Elevated Troponin, suspect demand ischemia #Tachycardia due to Sepsis - Continuous telemetry monitoring  - Maintain map >65 - HS Troponin peaked at 55  #Acute kidney Injury~IMPROVING #Lactic acidosis  - Trend BMP  - Replace electrolytes as indicated - Monitor UOP - Avoid nephrotoxic medications   #Elevated LFT's~IMPROVING CT  Abdomen with hepatomegaly.  NO gallstones, gallbladder thickening, or biliary dilatation. RUQ [place]: no evidence of cholelithiasis or cholecystitis.  Trace free fluid in RUQ. - Trend LFTs  & coags  #Normocytic Normochromic Anemia without s/sx of overt blood loss - Trend CBC  - Monitor for s/sx of bleeding - Transfuse for hgb <7 - VTE px: subq heparin   #Hyperglycemia, likely stress induced from critical illness Hemoglobin A1c 06/25/22: 5.6 - CBG's q4h; Target range of 140 to 180 - SSI - Follow ICU Hypo/Hyperglycemia protocol  #Acute Metabolic Encephalopathy in setting of Severe Sepsis/Bacteremia and concerning for strep meningitis  CT Head negative for acute intracranial abnormality UDS + for amphetamines and Benzodiazepines MRI Brain revealed no acute intracranial pathology  - Treatment of sepsis and metabolic derangements as outlined above - Provide supportive care - Promote normal sleep/wake cycle and family presence - Avoid  sedating meds as able - EEG results pending - LP pending   Patient is critically ill with multiorgan failure.  Prognosis is guarded, high risk for further decompensation, cardiac arrest and death.   Best Practice (right click and "Reselect all SmartList Selections" daily)  Diet/type: NPO DVT prophylaxis: prophylactic heparin  GI prophylaxis: N/A Lines: N/A Foley:  yes, and is still needed Code Status:  DNR Last date of multidisciplinary goals of care discussion [06/26/22]  4/27: Updated pts son and daughter at bedside   Labs   CBC: Recent Labs  Lab 06/26/2022 1009 06/25/22 0510 06/26/22 0358  WBC 25.3* 20.1* 24.8*  NEUTROABS 21.4*  --   --   HGB 11.9* 9.5* 10.1*  HCT 34.4* 27.7* 29.8*  MCV 90.1 90.2 89.8  PLT 232 175 237    Basic Metabolic Panel: Recent Labs  Lab 06/09/2022 1009 06/14/2022 2120 06/25/22 0508 06/25/22 1605 06/26/22 0400  NA 133* 133* 135 140 140  K 4.7 4.1 3.7 3.8 4.3  CL 100 99 98 102 100  CO2 17* 23 25 20* 26  GLUCOSE  218* 163* 122* 117* 147*  BUN 52* 54* 51* 50* 59*  CREATININE 2.25* 1.99* 1.97* 1.63* 1.50*  CALCIUM 9.1 8.2* 8.1* 9.3 8.3*  PHOS  --   --  6.1*  --  4.6   GFR: Estimated Creatinine Clearance: 29 mL/min (A) (by C-G formula based on SCr of 1.5 mg/dL (H)). Recent Labs  Lab 06/16/2022 1009 06/27/2022 1245 06/17/2022 1440 06/02/2022 1810 06/25/22 0510 06/25/22 1526 06/26/22 0358  PROCALCITON 4.04  --   --   --  21.81  --  14.75  WBC 25.3*  --   --   --  20.1*  --  24.8*  LATICACIDVEN 4.5* 2.0* 1.4 2.2*  --  2.8*  --     Liver Function Tests: Recent Labs  Lab 06/08/2022 1009 06/01/2022 1722 06/25/22 0508 06/25/22 0510 06/26/22 0358 06/26/22 0400  AST 40  --   --  26 21  --   ALT 28  --   --  20 19  --   ALKPHOS 197*  --   --  142* 139*  --   BILITOT 3.9*  --   --  3.8* 2.9*  --   PROT 7.2 5.9*  --  6.2* 6.6  --   ALBUMIN 2.2*  --  1.8* 1.9* 1.9* 1.9*   No results for input(s): "LIPASE", "AMYLASE" in the last 168 hours. Recent Labs  Lab 06/22/2022 1009  AMMONIA 11    ABG    Component Value Date/Time   HCO3 25.4 06/25/2022 1605   ACIDBASEDEF 0.2 06/16/2022 2120   O2SAT 73.7 06/25/2022 1605     Coagulation Profile: Recent Labs  Lab 06/08/2022 1009  INR 1.3*    Cardiac Enzymes: Recent Labs  Lab 06/18/2022 1009  CKTOTAL 111    HbA1C: Hgb A1c MFr Bld  Date/Time Value Ref Range Status  06/25/2022 05:08 AM 5.6 4.8 - 5.6 % Final    Comment:    (NOTE) Pre diabetes:          5.7%-6.4%  Diabetes:              >6.4%  Glycemic control for   <7.0% adults with diabetes     CBG: Recent Labs  Lab 06/25/22 1559 06/25/22 1916 06/25/22 2317 06/26/22 0317 06/26/22 0710  GLUCAP 114* 144* 154* 134* 148*    Review of Systems:   Unable to assess due to AMS  Past Medical History:  She,  has a past medical history of Generalized osteoarthritis, H/O benign breast biopsy (1964), H/O bilateral cataract extraction (2005), Hyperlipidemia, Hypertension, Mood disorder (HCC),  Non Hodgkin's lymphoma (HCC), Pancreatitis (10/20/2012), Postmenopausal, S/P ankle arthrodesis (2012), Sleep disorder, and Uterine fibroid.   Surgical History:   Past Surgical History:  Procedure Laterality Date   ABDOMINAL HYSTERECTOMY  1997   ANKLE ARTHRODESIS Right 2012   BREAST BIOPSY Left 1969   fibroadenoma   CATARACT EXTRACTION W/ INTRAOCULAR LENS IMPLANT Bilateral    LYMPH NODE BIOPSY  2014   excisional biopsy right groin   Thumb surgery     removal of part of extensor tendon     Social History:   reports that she has never smoked. She has never used smokeless tobacco. She reports current alcohol use. She reports that she does not use drugs.   Family History:  Her family history includes Alzheimer's disease in her mother; Diabetes in her maternal grandfather; Heart failure in her father; Prostate cancer in her father. There is no history of Breast cancer.   Allergies Allergies  Allergen Reactions   Cephalosporins     Rash   Penicillins     Significant Rash     Home Medications  Prior to Admission medications   Medication Sig Start Date End Date Taking? Authorizing Provider  prednisoLONE acetate (PRED FORTE) 1 % ophthalmic suspension Place 1 drop into both eyes every 4 (four) hours. 06/23/22  Yes [provider]  Ascorbic Acid (VITAMIN C) 1000 MG tablet Take 1 tablet by mouth as directed. Takes off and on , sporadically    [provider]  Cholecalciferol (VITAMIN D3) 50 MCG (2000 UT) capsule Take 1 capsule by mouth as directed. Takes off and of, sporadically    [provider]  ibrutinib (IMBRUVICA) 140 MG capsule Take 3 capsules (420 mg total) by mouth daily. Take with a glass of water. 03/10/22   Johney Maine, MD  lisinopril (ZESTRIL) 5 MG tablet TAKE 1 TABLET EVERY DAY 05/14/22   Sharon Seller, NP  Loratadine 10 MG CAPS Take 1 tablet by mouth daily.    [provider]  simvastatin (ZOCOR) 20 MG tablet TAKE 1 TABLET  EVERY DAY 05/14/22   Sharon Seller, NP  temazepam (RESTORIL) 15 MG capsule Take one capsule by mouth every night at bedtime as needed for sleep. 02/25/22   Sharon Seller, NP  vitamin E 180 MG (400 UNITS) capsule Take 400 Units by mouth as directed. Takes off and on, sporadically    [provider]     Critical care time: 50 minutes     Zada Girt, AGNP  Pulmonary/Critical Care Pager [phone] (please enter 7 digits) PCCM Consult Pager [phone] (please enter 7 digits)

## 2022-06-26 NOTE — Progress Notes (Signed)
Pharmacy Antibiotic Note  Sherry Mosley is a 77 y.o. female admitted on 06/14/2022 with pneumonia.  Pharmacy has been consulted for vancomycin dosing. Patient with S pneumoniae bacteremia and loculated pleural effusion.  She has rash documented for PCN and cephalosporins (no specific agent listed), contacted PCP office in [place] when reaction occurred ~5y ago. See progress note from 4/26 re: allergy clarification.  Patient without any previously documented cephalosporin use.  She has PMH of NHL on imbrutinib  Today, 06/26/2022 Day # 3 antibiotics Renal: AKI resolving - SCr 1.5 Persistent leukocytosis on steroids, Tmax 101 S. Pneumoniae Ur Ag: positive Pleural fluid: LDH and nucleated cells elevated consistent with exudate. Fluid culture collected Receiving TPA/dornase  Vancomycin level: Vancomycin 1500 mg IV given 4/25 at 1130 Random vancomycin level 4/26 at 1152 = 10 mcg/mL Vancomycin 1000 mg IV given 4/26 at 1602 Random vancomycin level 4/27 at 0358 = 15 mcg/mL Plan: Acyclovir 700 mg (10 mg/kg) IV q12h started 4/27 due to concern for HSV encephalitis Cefepime 2 g IV q24h Borderline renal dose adjustment. With down-trending Scr may consider switching to q12h given concern for CNS infection. However, ceftriaxone might also be an option Due to unclear allergy to cephalosporin, vancomycin ordered for S pneumoniae.   Based on random vancomycin level, repeat vancomycin 1 gm IV x 1 today and reheck random vancomycin level 4/28. Dose per random levels until SCr stable Follow renal function and cultures ID following  Continue to follow for ability to use ceftriaxone if able to get more information re: drug. Son is to arrive 4/26.    Height: 5\' 3"  (160 cm) Weight: 69.9 kg (154 lb 1.6 oz) IBW/kg (Calculated) : 52.4  Temp (24hrs), Avg:100.4 F (38 C), Min:99.7 F (37.6 C), Max:101 F (38.3 C)  Recent Labs  Lab 06/10/2022 1009 06/16/2022 1245 06/17/2022 1440 06/28/2022 1810 06/11/2022 2120  06/25/22 0508 06/25/22 0510 06/25/22 1152 06/25/22 1526 06/25/22 1605 06/26/22 0358 06/26/22 0400  WBC 25.3*  --   --   --   --   --  20.1*  --   --   --  24.8*  --   CREATININE 2.25*  --   --   --  1.99* 1.97*  --   --   --  1.63*  --  1.50*  LATICACIDVEN 4.5* 2.0* 1.4 2.2*  --   --   --   --  2.8*  --   --   --   VANCORANDOM  --   --   --   --   --   --   --  10  --   --  15  --      Estimated Creatinine Clearance: 29 mL/min (A) (by C-G formula based on SCr of 1.5 mg/dL (H)).    Allergies  Allergen Reactions   Cephalosporins     Rash   Penicillins     Significant Rash    Antimicrobials this admission: 4/25 Vancomycin >>  4/25 Metronidazole >> 4/25 Aztreonam >> 4/26 4/26 Cefepime >> 4/27 Acyclovir >>  Dose adjustments this admission: N/A  Microbiology results: 4/25 BCx: GPC 3/4 bottles, BCID detects S pneumoniae 4/25 MRSA PCR: (-) 4/25 RVP: (-) 4/26 Right eye wound: pending 4/27 BCx: NGTD  Thank you for allowing pharmacy to be a part of this patient's care.  Tressie Ellis  06/26/2022 8:32 AM

## 2022-06-27 ENCOUNTER — Inpatient Hospital Stay: Payer: Medicare PPO

## 2022-06-27 DIAGNOSIS — R6520 Severe sepsis without septic shock: Secondary | ICD-10-CM | POA: Diagnosis not present

## 2022-06-27 DIAGNOSIS — N179 Acute kidney failure, unspecified: Secondary | ICD-10-CM

## 2022-06-27 DIAGNOSIS — J90 Pleural effusion, not elsewhere classified: Secondary | ICD-10-CM

## 2022-06-27 DIAGNOSIS — A419 Sepsis, unspecified organism: Secondary | ICD-10-CM | POA: Diagnosis not present

## 2022-06-27 DIAGNOSIS — Z7189 Other specified counseling: Secondary | ICD-10-CM

## 2022-06-27 DIAGNOSIS — Z515 Encounter for palliative care: Secondary | ICD-10-CM

## 2022-06-27 LAB — RENAL FUNCTION PANEL
Albumin: 1.8 g/dL — ABNORMAL LOW (ref 3.5–5.0)
Anion gap: 13 (ref 5–15)
BUN: 72 mg/dL — ABNORMAL HIGH (ref 8–23)
CO2: 26 mmol/L (ref 22–32)
Calcium: 8.1 mg/dL — ABNORMAL LOW (ref 8.9–10.3)
Chloride: 103 mmol/L (ref 98–111)
Creatinine, Ser: 1.33 mg/dL — ABNORMAL HIGH (ref 0.44–1.00)
GFR, Estimated: 41 mL/min — ABNORMAL LOW (ref >60)
Glucose, Bld: 174 mg/dL — ABNORMAL HIGH (ref 70–99)
Phosphorus: 3.8 mg/dL (ref 2.5–4.6)
Potassium: 4.4 mmol/L (ref 3.5–5.1)
Sodium: 142 mmol/L (ref 135–145)

## 2022-06-27 LAB — GLUCOSE, CAPILLARY
Glucose-Capillary: 155 mg/dL — ABNORMAL HIGH (ref 70–99)
Glucose-Capillary: 160 mg/dL — ABNORMAL HIGH (ref 70–99)
Glucose-Capillary: 144 mg/dL — ABNORMAL HIGH (ref 70–99)
Glucose-Capillary: 156 mg/dL — ABNORMAL HIGH (ref 70–99)
Glucose-Capillary: 180 mg/dL — ABNORMAL HIGH (ref 70–99)
Glucose-Capillary: 162 mg/dL — ABNORMAL HIGH (ref 70–99)

## 2022-06-27 LAB — CBC
HCT: 31.2 % — ABNORMAL LOW (ref 36.0–46.0)
Hemoglobin: 10.4 g/dL — ABNORMAL LOW (ref 12.0–15.0)
MCH: 30.1 pg (ref 26.0–34.0)
MCHC: 33.3 g/dL (ref 30.0–36.0)
MCV: 90.4 fL (ref 80.0–100.0)
Platelets: 249 10*3/uL (ref 150–400)
RBC: 3.45 MIL/uL — ABNORMAL LOW (ref 3.87–5.11)
RDW: 13.9 % (ref 11.5–15.5)
WBC: 19.9 10*3/uL — ABNORMAL HIGH (ref 4.0–10.5)
nRBC: 0.1 % (ref 0.0–0.2)

## 2022-06-27 LAB — CULTURE, BLOOD (ROUTINE X 2)

## 2022-06-27 LAB — VANCOMYCIN, RANDOM: Vancomycin Rm: 13 ug/mL

## 2022-06-27 LAB — BLOOD GAS, VENOUS
O2 Saturation: 49 %
Patient temperature: 37

## 2022-06-27 LAB — HSV(HERPES SIMPLEX VRS) I + II AB-IGG
HSV 1 Glycoprotein G Ab, IgG: <0.91 index (ref 0.00–0.90)
HSV 2 Glycoprotein G Ab, IgG: <0.91 index (ref 0.00–0.90)

## 2022-06-27 LAB — PROCALCITONIN: Procalcitonin: 14.73 ng/mL

## 2022-06-27 LAB — EYE CULTURE W GRAM STAIN

## 2022-06-27 MED ORDER — SODIUM CHLORIDE 0.9 % IV SOLN: 2.0000 g | INTRAVENOUS | Status: DC

## 2022-06-27 MED ORDER — SODIUM CHLORIDE 0.9 % IV SOLN
2.0000 g | INTRAVENOUS | Status: DC
  Filled 2022-06-27: qty 20

## 2022-06-27 NOTE — Progress Notes (Signed)
ID Brief Note ( remotely)   Febrile 4/27 CSF ME panel + for strep pneumo, CSF cx with GPC in pairs, NG in less than 24 hrs, acyclovir has been dc'ed  Remains on Vancomycin, ceftriaxone 2g iv q12 hrs and metronidazole + dexamethasone   4/27 repeat blood cx NG in 1 day Sensi of strep penumo is pending   Please call with questions. Dr Thedore Mins here from Monday  Odette Fraction, MD Infectious Disease Physician [facility] [address] [place], [place] [address] Phone: [phone]  Fax: [phone]

## 2022-06-27 NOTE — Plan of Care (Signed)
  Problem: Education: Goal: Ability to describe self-care measures that may prevent or decrease complications (Diabetes Survival Skills Education) will improve Outcome: Not Progressing Goal: Individualized Educational Video(s) Outcome: Not Progressing   Problem: Coping: Goal: Ability to adjust to condition or change in health will improve Outcome: Not Progressing   Problem: Fluid Volume: Goal: Ability to maintain a balanced intake and output will improve Outcome: Not Progressing   Problem: Health Behavior/Discharge Planning: Goal: Ability to identify and utilize available resources and services will improve Outcome: Not Progressing Goal: Ability to manage health-related needs will improve Outcome: Not Progressing   Problem: Metabolic: Goal: Ability to maintain appropriate glucose levels will improve Outcome: Not Progressing   Problem: Nutritional: Goal: Maintenance of adequate nutrition will improve Outcome: Not Progressing Goal: Progress toward achieving an optimal weight will improve Outcome: Not Progressing   Problem: Skin Integrity: Goal: Risk for impaired skin integrity will decrease Outcome: Not Progressing   Problem: Tissue Perfusion: Goal: Adequacy of tissue perfusion will improve Outcome: Not Progressing   Problem: Education: Goal: Knowledge of General Education information will improve Description: Including pain rating scale, medication(s)/side effects and non-pharmacologic comfort measures Outcome: Not Progressing   Problem: Health Behavior/Discharge Planning: Goal: Ability to manage health-related needs will improve Outcome: Not Progressing   Problem: Clinical Measurements: Goal: Ability to maintain clinical measurements within normal limits will improve Outcome: Not Progressing Goal: Will remain free from infection Outcome: Not Progressing Goal: Diagnostic test results will improve Outcome: Not Progressing Goal: Respiratory complications will  improve Outcome: Not Progressing Goal: Cardiovascular complication will be avoided Outcome: Not Progressing   Problem: Activity: Goal: Risk for activity intolerance will decrease Outcome: Not Progressing   Problem: Nutrition: Goal: Adequate nutrition will be maintained Outcome: Not Progressing   Problem: Coping: Goal: Level of anxiety will decrease Outcome: Not Progressing   Problem: Elimination: Goal: Will not experience complications related to bowel motility Outcome: Not Progressing Goal: Will not experience complications related to urinary retention Outcome: Not Progressing   Problem: Pain Managment: Goal: General experience of comfort will improve Outcome: Not Progressing   Problem: Safety: Goal: Ability to remain free from injury will improve Outcome: Not Progressing   Problem: Skin Integrity: Goal: Risk for impaired skin integrity will decrease Outcome: Not Progressing Patient total care only responding to painful stimuli

## 2022-06-27 NOTE — Progress Notes (Signed)
NAME:  Sherry Mosley, MRN:  [id number], DOB:  1945/05/26, LOS: 3 ADMISSION DATE:  06/09/2022, CONSULTATION DATE:  06/14/2022 REFERRING MD:  Dr. Fanny Bien, CHIEF COMPLAINT:  Altered Mental Status   Brief Pt Description / Synopsis:  77 y.o. female with PMHx significant for Non Hodgkin's Lymphoma, who is admitted with Acute Metabolic Encephalopathy and Acute Hypoxic Respiratory Failure due to Severe Sepsis from STREP PNEUMO BACTEREMIA in setting of Right sided Pneumonia and loculated right sided pleural effusion, along with Acute Kidney Injury and Anion Gap metabolic acidosis.  History of Present Illness:  Sherry Mosley is a 77 year old female with a past medical history significant for non-Hodgkin's lymphoma, hypertension, hyperlipidemia who presents to [facility] ED on 06/02/2022 due to altered mental status.  Patient is currently altered and unable to contribute to history, therefore history is obtained from patient's husband at bedside along with chart review.  Per the patient's husband, she was seen a couple days ago by her ophthalmologist and diagnosed with conjunctivitis and was prescribed antibiotic drops.  He also reports that she was complaining of soreness with her right ribs, however she did not want to seek medical care at that time.  He reports she has had increasing fatigue and weakness along with multiple falls.  The night before last she fell onto her buttocks, did not hit her head, and he was able to get her back to bed.  This morning she was extremely confused prompting him to contact EMS for further medical evaluation.  ED Course: Initial Vital Signs: Pulse 151, respiratory rate 36, blood pressure 144/84, SpO2 100% on BiPAP, temperature 101.2 F rectally Significant Labs: Sodium 133, bicarb 17, anion gap 16, BUN 52, creatinine 2.25, glucose 218, alkaline phosphatase 197, total bilirubin 3.9, albumin 2.2, high-sensitivity troponin 49, lactic acid 4.5 (later trended down to 2.0), procalcitonin 4, WBC  25.3 with neutrophilia, hemoglobin 11.9, hematocrit 34.4 VBG: pH 7.43/pCO2 28/pO2 less than 31/bicarb 18.6 COVID-19 PCR is negative Urinalysis is negative for UTI EKG: EKG time: 1010 heart rate 150 QRS 80 QTc 460 Sinus tachycardia.  Nonspecific ST abnormality, no frank ischemia.  Possible rate related.  Cannot rule out ischemia Imaging: Chest X-ray>>Small right effusion with adjacent lung opacity. Acute infiltrates possible. Recommend follow-up to confirm clearance CT chest/abdomen/pelvis>>Examination is generally limited by breath motion artifact and lack of intravenous contrast. Moderate, loculated right pleural effusion with extensive associated atelectasis or consolidation. No overlying rib fracture. Large contusion and hematoma of the left buttock. No significant change in retroperitoneal, iliac, inguinal, and mesenteric lymphadenopathy, as well as splenomegaly, in keeping with history of lymphoma. Hepatomegaly. Aortic Atherosclerosis CT Head/Cervical Spine>>No acute intracranial pathology. Small-vessel white matter disease. No fracture or static subluxation of the cervical spine. Severe disc space height loss and osteophytosis from C5 through C7 with otherwise relatively preserved disc spaces. Partially imaged right-sided pleural mass or fluid; please see forthcoming dedicated examination of the chest abdomen and pelvis. Medications Administered: 1.725 L of LR boluses, IV aztreonam, Flagyl, and vancomycin  She met sepsis criteria therefore she was given IV fluid resuscitation along with broad-spectrum antibiotics.  Blood cultures were drawn.  Patient's husband at bedside and her son who is an ED physician in [place] both verify that patient is to be a DNR/DNI given her previous wishes.  PCCM is asked to admit for further workup and treatment.  Please see "significant hospital events" section below for full detailed hospital course.  Pertinent  Medical History   Past Medical History:   Diagnosis Date  Generalized osteoarthritis    H/O benign breast biopsy 1964   H/O bilateral cataract extraction 2005   Hyperlipidemia    Hypertension    Mood disorder (HCC)    Non Hodgkin's lymphoma (HCC)    Pancreatitis 10/20/2012   unknown etiology   Postmenopausal    S/P ankle arthrodesis 2012   Right   Sleep disorder    Uterine fibroid    Micro Data:  4/25: SARS-CoV-2 PCR>> negative 4/25: Respiratory viral panel>>negative 4/25: Blood culture x 2>>Streptococcus pneumoniae  4/25: Strep pneumo urinary antigen>>positive  4/25: Legionella urine antigen>>negative  4/25: Pleural fluid>> 4/25: MRSA PCR>>negative 4/27: CSF fluid>>strep pneumonia   Antimicrobials:   Anti-infectives (From admission, onward)    Start     Dose/Rate Route Frequency Ordered Stop   06/28/22 1000  cefTRIAXone (ROCEPHIN) 2 g in sodium chloride 0.9 % 100 mL IVPB  Status:  Discontinued        2 g 200 mL/hr over 30 Minutes Intravenous Every 24 hours 06/27/22 1017 06/27/22 1018   06/28/22 1000  cefTRIAXone (ROCEPHIN) 2 g in sodium chloride 0.9 % 100 mL IVPB        2 g 200 mL/hr over 30 Minutes Intravenous Every 24 hours 06/27/22 1018     06/26/22 1215  cefTRIAXone (ROCEPHIN) 2 g in sodium chloride 0.9 % 100 mL IVPB  Status:  Discontinued        2 g 200 mL/hr over 30 Minutes Intravenous Every 12 hours 06/26/22 1125 06/27/22 1017   06/26/22 1000  acyclovir (ZOVIRAX) 700 mg in dextrose 5 % 100 mL IVPB  Status:  Discontinued        10 mg/kg  69.9 kg 114 mL/hr over 60 Minutes Intravenous Every 12 hours 06/26/22 0832 06/26/22 2059   06/26/22 0930  vancomycin (VANCOCIN) IVPB 1000 mg/200 mL premix        1,000 mg 200 mL/hr over 60 Minutes Intravenous  Once 06/26/22 0838 06/26/22 1113   06/25/22 1930  ceFEPIme (MAXIPIME) 2 g in sodium chloride 0.9 % 100 mL IVPB  Status:  Discontinued        2 g 200 mL/hr over 30 Minutes Intravenous Every 24 hours 06/25/22 1829 06/26/22 1125   06/25/22 1630  vancomycin  (VANCOCIN) IVPB 1000 mg/200 mL premix        1,000 mg 200 mL/hr over 60 Minutes Intravenous  Once 06/25/22 1539 06/26/22 0455   06/14/2022 2300  metroNIDAZOLE (FLAGYL) IVPB 500 mg  Status:  Discontinued        500 mg 100 mL/hr over 60 Minutes Intravenous Every 12 hours 06/10/2022 2121 06/27/22 1017   06/02/2022 2200  aztreonam (AZACTAM) 1 g in sodium chloride 0.9 % 100 mL IVPB  Status:  Discontinued        1 g 200 mL/hr over 30 Minutes Intravenous Every 8 hours 06/20/2022 1302 06/25/22 0336   06/25/2022 1257  vancomycin variable dose per unstable renal function (pharmacist dosing)  Status:  Discontinued         Does not apply See admin instructions 06/03/2022 1302 06/27/22 1017   06/10/2022 1245  metroNIDAZOLE (FLAGYL) IVPB 500 mg        500 mg 100 mL/hr over 60 Minutes Intravenous  Once 06/23/2022 1242 06/04/2022 1349   06/23/2022 1100  vancomycin (VANCOREADY) IVPB 1500 mg/300 mL        1,500 mg 150 mL/hr over 120 Minutes Intravenous  Once 06/21/2022 1032 06/05/2022 1338   06/15/2022 1030  aztreonam (AZACTAM) 2 g in  sodium chloride 0.9 % 100 mL IVPB        2 g 200 mL/hr over 30 Minutes Intravenous  Once 06/17/2022 1021 06/18/2022 1133   06/15/2022 1030  vancomycin (VANCOCIN) IVPB 1000 mg/200 mL premix  Status:  Discontinued        1,000 mg 200 mL/hr over 60 Minutes Intravenous  Once 06/10/2022 1021 06/08/2022 1032      Significant Hospital Events: Including procedures, antibiotic start and stop dates in addition to other pertinent events   4/25: Presented to ED with altered mental status and acute respiratory failure requiring BiPAP.  Patient is DNR/DNI per her previous wishes.  Critically ill with multiorgan failure, PCCM asked to admit.  IR placed chest tube for loculated right-sided pleural effusion. 4/26: Blood cultures with Strep Pneumo, consult ID.  Pleural fluid consistent Exudative effusion, pleural culture still pending. Chest tube with minimal drainage, plan to instill tpa/dornase.  AKI slowly improving,  respiratory status improving, weaned off BiPAP to [place].  Remains encephalopathic, EEG pending 4/27: Pt remains encephalopathic unable to follow commands.  On HHFNC FiO2 @50 %.  EEG results pending.  Pt febrile temp 102.9.  Pt pending LP per IR.  Will start acyclovir given lack of improvement in mentation.  Pt received TPA via chest tube x2 doses  4/28: LP positive for strep meningitis.  Mentation not improved and pt remains febrile.   Continue TPA bid via chest tube today due to loculated right pleural effusion   Interim History / Subjective:  As outlined above   Objective   Blood pressure (!) 147/82, pulse (!) 114, temperature (!) 101.7 F (38.7 C), resp. rate (!) 25, height 5\' 3"  (1.6 m), weight 69.9 kg, SpO2 92 %.    FiO2 (%):  [60 %-65 %] 60 %   Intake/Output Summary (Last 24 hours) at 06/27/2022 1039 Last data filed at 06/27/2022 1610 Gross per 24 hour  Intake 1083.16 ml  Output 2165 ml  Net -1081.84 ml   Filed Weights   06/16/2022 1046 06/22/2022 1430 06/25/22 0500  Weight: 69 kg 67.7 kg 69.9 kg    Examination: General: Acutely on chronically-ill appearing female, laying in bed, NAD on HHFNC @50 % HENT: Atraumatic, normocephalic, neck supple, no JVD, conjunctivitis to right eye improving  Lungs: Diminished throughout, even, nonlabored Cardiovascular: Sinus tachycardia, S1-S2, no m/rg, 2+ radial/1+ distal pulses, no edema Abdomen: Obese, soft, non distended, no guarding or rebound tenderness, bowel sounds positive x 4 Extremities: Normal bulk and tone Neuro: Obtunded, not following commands, bilateral pupils reactive but sluggish, conjunctivitis of right eye   GU: Foley catheter in place draining yellow urine Skin: Scattered ecchymosis with large bruising to right buttock/hip (see images below)        Resolved Hospital Problem list   AG Metabolic Acidosis Hyponatremia   Assessment & Plan:   #Acute Hypoxic Respiratory Failure in the setting of Pneumonia & Right sided  Loculated Pleural Effusion - Supplemental O2 or Bipap as needed to maintain O2 sats >92% - Follow intermittent Chest X-ray & ABG as needed - Bronchodilators  - ABX as above - Continue chest tube: will instill tPA and dornase today 06/26/22 - Pleural fluid consistent with EXUDATE, culture still pending differentiate b/w Parapneumonic effusion vs. Empyema  - Pulmonary toilet as able - Continue TPA bid via chest tube today   #Severe Sepsis in setting of STREPTOCOCCUS PNEUMONIAE BACTEREMIA due to Pneumonia & Loculated pleural effusion (Meets SIRS criteria at admission: Temp. 101.2, RR 37, HR 153, WBC 25.3, Lactic  4.5) #Right eye Conjunctivitis (present on admission, was being treat outpatient) - Trend WBC and monitor fever curve - Trend PCT  - Follow cultures  - HSV 1+2 ab-IgG pending  - Continue decadron - Abx as outlined above   - ID consulted appreciate input  - Scheduled tylenol and cooling blanket for fevers   #Mildly Elevated Troponin, suspect demand ischemia #Tachycardia due to Sepsis - Continuous telemetry monitoring  - Maintain map >65 - HS Troponin peaked at 55  #Acute kidney Injury~IMPROVING #Lactic acidosis  - Trend BMP  - Replace electrolytes as indicated - Monitor UOP - Avoid nephrotoxic medications   #Elevated LFT's~RESOLVED  CT Abdomen with hepatomegaly.  NO gallstones, gallbladder thickening, or biliary dilatation. RUQ [place]: no evidence of cholelithiasis or cholecystitis.  Trace free fluid in RUQ. - Trend LFTs  & coags  #Normocytic Normochromic Anemia without s/sx of overt blood loss - Trend CBC  - Monitor for s/sx of bleeding - Transfuse for hgb <7 - VTE px: subq heparin   #Hyperglycemia, likely stress induced from critical illness Hemoglobin A1c 06/25/22: 5.6 - CBG's q4h; Target range of 140 to 180 - SSI - Follow ICU Hypo/Hyperglycemia protocol  #Acute Metabolic Encephalopathy in setting of strep meningitis  CT Head negative for acute intracranial  abnormality UDS + for amphetamines and Benzodiazepines MRI Brain revealed no acute intracranial pathology  - Treatment of sepsis and metabolic derangements as outlined above - Provide supportive care - Promote normal sleep/wake cycle and family presence - Avoid sedating meds as able  Patient is critically ill with multiorgan failure.  Prognosis is guarded, high risk for further decompensation, cardiac arrest and death.  Palliative Care consulted  Best Practice (right click and "Reselect all SmartList Selections" daily)  Diet/type: NPO DVT prophylaxis: prophylactic heparin  GI prophylaxis: N/A Lines: N/A Foley:  yes, and is still needed Code Status:  DNR Last date of multidisciplinary goals of care discussion [06/27/22]  4/28: Updated pts daughter and husband regarding pts condition and current plan of care  Labs   CBC: Recent Labs  Lab 06/11/2022 1009 06/25/22 0510 06/26/22 0358 06/27/22 0356  WBC 25.3* 20.1* 24.8* 19.9*  NEUTROABS 21.4*  --   --   --   HGB 11.9* 9.5* 10.1* 10.4*  HCT 34.4* 27.7* 29.8* 31.2*  MCV 90.1 90.2 89.8 90.4  PLT 232 175 237 249    Basic Metabolic Panel: Recent Labs  Lab 06/22/2022 2120 06/25/22 0508 06/25/22 1605 06/26/22 0358 06/26/22 0400 06/27/22 0356  NA 133* 135 140  --  140 142  K 4.1 3.7 3.8  --  4.3 4.4  CL 99 98 102  --  100 103  CO2 23 25 20*  --  26 26  GLUCOSE 163* 122* 117*  --  147* 174*  BUN 54* 51* 50*  --  59* 72*  CREATININE 1.99* 1.97* 1.63*  --  1.50* 1.33*  CALCIUM 8.2* 8.1* 9.3  --  8.3* 8.1*  MG  --   --   --  2.6*  --   --   PHOS  --  6.1*  --   --  4.6 3.8   GFR: Estimated Creatinine Clearance: 32.7 mL/min (A) (by C-G formula based on SCr of 1.33 mg/dL (H)). Recent Labs  Lab 06/25/2022 1009 06/23/2022 1245 06/14/2022 1440 06/20/2022 1810 06/25/22 0510 06/25/22 1526 06/26/22 0358 06/26/22 0808 06/27/22 0356  PROCALCITON 4.04  --   --   --  21.81  --  14.75 14.73  --  WBC 25.3*  --   --   --  20.1*  --  24.8*   --  19.9*  LATICACIDVEN 4.5* 2.0* 1.4 2.2*  --  2.8*  --   --   --     Liver Function Tests: Recent Labs  Lab 06/15/2022 1009 06/21/2022 1722 06/25/22 0508 06/25/22 0510 06/26/22 0358 06/26/22 0400 06/27/22 0356  AST 40  --   --  26 21  --   --   ALT 28  --   --  20 19  --   --   ALKPHOS 197*  --   --  142* 139*  --   --   BILITOT 3.9*  --   --  3.8* 2.9*  --   --   PROT 7.2 5.9*  --  6.2* 6.6  --   --   ALBUMIN 2.2*  --  1.8* 1.9* 1.9* 1.9* 1.8*   No results for input(s): "LIPASE", "AMYLASE" in the last 168 hours. Recent Labs  Lab 06/12/2022 1009  AMMONIA 11    ABG    Component Value Date/Time   HCO3 28.9 (H) 06/26/2022 0808   ACIDBASEDEF 0.2 06/12/2022 2120   O2SAT 76.9 06/26/2022 0808     Coagulation Profile: Recent Labs  Lab 06/17/2022 1009  INR 1.3*    Cardiac Enzymes: Recent Labs  Lab 06/12/2022 1009  CKTOTAL 111    HbA1C: Hgb A1c MFr Bld  Date/Time Value Ref Range Status  06/25/2022 05:08 AM 5.6 4.8 - 5.6 % Final    Comment:    (NOTE) Pre diabetes:          5.7%-6.4%  Diabetes:              >6.4%  Glycemic control for   <7.0% adults with diabetes     CBG: Recent Labs  Lab 06/26/22 1615 06/26/22 2000 06/26/22 2302 06/27/22 0315 06/27/22 0729  GLUCAP 168* 171* 185* 155* 160*    Review of Systems:   Unable to assess due to AMS   Past Medical History:  She,  has a past medical history of Generalized osteoarthritis, H/O benign breast biopsy (1964), H/O bilateral cataract extraction (2005), Hyperlipidemia, Hypertension, Mood disorder (HCC), Non Hodgkin's lymphoma (HCC), Pancreatitis (10/20/2012), Postmenopausal, S/P ankle arthrodesis (2012), Sleep disorder, and Uterine fibroid.   Surgical History:   Past Surgical History:  Procedure Laterality Date   ABDOMINAL HYSTERECTOMY  1997   ANKLE ARTHRODESIS Right 2012   BREAST BIOPSY Left 1969   fibroadenoma   CATARACT EXTRACTION W/ INTRAOCULAR LENS IMPLANT Bilateral    LYMPH NODE BIOPSY  2014    excisional biopsy right groin   Thumb surgery     removal of part of extensor tendon     Social History:   reports that she has never smoked. She has never used smokeless tobacco. She reports current alcohol use. She reports that she does not use drugs.   Family History:  Her family history includes Alzheimer's disease in her mother; Diabetes in her maternal grandfather; Heart failure in her father; Prostate cancer in her father. There is no history of Breast cancer.   Allergies Allergies  Allergen Reactions   Cephalosporins     Rash   Penicillins     Significant Rash     Home Medications  Prior to Admission medications   Medication Sig Start Date End Date Taking? Authorizing Provider  prednisoLONE acetate (PRED FORTE) 1 % ophthalmic suspension Place 1 drop into both eyes every 4 (four)  hours. 06/23/22  Yes [provider]  Ascorbic Acid (VITAMIN C) 1000 MG tablet Take 1 tablet by mouth as directed. Takes off and on , sporadically    [provider]  Cholecalciferol (VITAMIN D3) 50 MCG (2000 UT) capsule Take 1 capsule by mouth as directed. Takes off and of, sporadically    [provider]  ibrutinib (IMBRUVICA) 140 MG capsule Take 3 capsules (420 mg total) by mouth daily. Take with a glass of water. 03/10/22   Johney Maine, MD  lisinopril (ZESTRIL) 5 MG tablet TAKE 1 TABLET EVERY DAY 05/14/22   Sharon Seller, NP  Loratadine 10 MG CAPS Take 1 tablet by mouth daily.    [provider]  simvastatin (ZOCOR) 20 MG tablet TAKE 1 TABLET EVERY DAY 05/14/22   Sharon Seller, NP  temazepam (RESTORIL) 15 MG capsule Take one capsule by mouth every night at bedtime as needed for sleep. 02/25/22   Sharon Seller, NP  vitamin E 180 MG (400 UNITS) capsule Take 400 Units by mouth as directed. Takes off and on, sporadically    [provider]     Critical care time: 40 minutes     Zada Girt, AGNP  Pulmonary/Critical Care Pager  [phone] (please enter 7 digits) PCCM Consult Pager [phone] (please enter 7 digits)

## 2022-06-27 NOTE — Progress Notes (Signed)
PHARMACY CONSULT NOTE - FOLLOW UP  Pharmacy Consult for Electrolyte Monitoring and Replacement   Recent Labs: Potassium (mmol/L)  Date Value  06/27/2022 4.4   Magnesium (mg/dL)  Date Value  25/36/6440 2.6 (H)   Calcium (mg/dL)  Date Value  34/74/2595 8.1 (L)   Albumin (g/dL)  Date Value  63/87/5643 1.8 (L)   Phosphorus (mg/dL)  Date Value  32/95/1884 3.8   Sodium (mmol/L)  Date Value  06/27/2022 142     Assessment: 77 y.o. female with PMHx significant for Non Hodgkin's Lymphoma, who is admitted with Acute Metabolic Encephalopathy and Acute Hypoxic Respiratory Failure due to Severe Sepsis from STREP PNEUMO BACTEREMIA in setting of Right sided Pneumonia and loculated right sided pleural effusion, along with Acute Kidney Injury and Anion Gap metabolic acidosis. Pharmacy has been consulted to monitor and replace electrolytes while under PCCM care.  Goal of Therapy:  Electrolytes wnl.  Plan:  No replacement warranted at this time. Follow up with 4/26 AM labs.  Bettey Costa ,PharmD Clinical Pharmacist 06/27/2022 8:21 AM

## 2022-06-27 NOTE — Consult Note (Signed)
Consultation Note Date: 06/27/2022   Patient Name: Sherry Mosley  DOB: [birth year]  MRN: [id number]  Age / Sex: 77 y.o., female  PCP: Sharon Seller, NP Referring Physician: Earley Brooke, MD  Reason for Consultation: Establishing goals of care   HPI/Brief Hospital Course: 77 y.o. female  with past medical history of Non-Hodgkin's Lymphoma, HTN, HLD  admitted on 06/26/2022 with acute hypoxic respiratory failure secondary to severe sepsis from strep pneumo bacteremia in the setting of right sided pneumonia and loculated right sided pleural effusion. AKI also noted.  EMS called by her husband-Ned due to altered mental status. Park Breed shares that Ms. Fennel was recently diagnosed with conjunctivitis by her ophthalmologist. Remote complaint of right sided rib pain but she did not seek medical care for this complaint. Over the last few days he reports increased fatigue and weakness along with multiple falls.   CT chest/abdomen/pelvis>>Examination is generally limited by breath motion artifact and lack of intravenous contrast. Moderate, loculated right pleural effusion with extensive associated atelectasis or consolidation. No overlying rib fracture. Large contusion and hematoma of the left buttock. No significant change in retroperitoneal, iliac, inguinal, and mesenteric lymphadenopathy, as well as splenomegaly, in keeping with history of lymphoma. Hepatomegaly. Aortic Atherosclerosis  4/25: BiPAP, CCM confirmed DNR/DNI with family, multiorgan failure, chest tube placed by IR 4/27: Remained encephalopathic, HHFNC 50% FiO2 4/28: LP positive for strep meningitis, remains febrile, remains encephalopathic, remains on [facility]  Palliative medicine was consulted for assisting with goals of care conversations.  Subjective:  Extensive chart review has been completed prior to meeting patient including labs, vital signs, imaging, progress notes, orders, and available advanced  directive documents from current and previous encounters.  Introduced myself as a Publishing rights manager as a member of the palliative care team. Explained palliative medicine is specialized medical care for people living with serious illness. It focuses on providing relief from the symptoms and stress of a serious illness. The goal is to improve quality of life for both the patient and the family.   Visited with Ms. Onalee Hua at her bedside, nursing staff assisting with hygiene. Met with husband-Ned and daughter-Heidi outside of room. Park Breed shares he and Ms. Headings moved to [facility] in 2021 after they both retired. 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 Ms. Mackel has two children, Heidi and Chris-ED physician from a previous marriage.  Ned and Heidi able to share their understanding of current medical condition as stated above. Updates being provided by [facility] team and are appreciative of care Ms. Killilea has received from medical team.  Park Breed shares that he, Thayer Ohm and Trudie Buckler are in agreement to continue current medical treatments and interventions through today and are anticipating transition to comfort care 06/28/2022. Ms. Adelstein has siblings that live out of state who have been contacted and will be traveling to be able to visit.  We briefly discussed transition to comfort care. Ned and Heidi share they have an understanding and wish to focus on comfort providing medications as needed as IV fluids and antibiotics are discontinued and supplemental oxygen is weaned and discontinued.  Park Breed shares he has also arranged for cremation. He is scheduled to meet with [facility] in the morning to keep them updated on Ms. Blea's status.  Discussed possibility of returning to [facility] with hospice services if Ms. Dusing remains comfortable but condition remains stable. Ned and Heidi anticipate and are hopeful of a hospital death but would be open to hospice services if needed.  [employer]  and Heidi are appreciative of meeting  and will anticipate member from PMT to connect with them 4/29 AM to assist with transition to comfort care.  Introduced myself and offered support to Chris-son who was at bedside of return to room.  I discussed importance of continued conversations with family/support persons and all members of their medical team regarding overall plan of care and treatment options ensuring decisions are in alignment with patients goals of care.  All questions/concerns addressed. Emotional support provided to patient/family/support persons. PMT will continue to follow and support patient as needed.  Objective: Primary Diagnoses: Present on Admission:  Severe sepsis [facility])   Physical Exam Constitutional:      General: She is not in acute distress.    Appearance: She is ill-appearing.  Pulmonary:     Effort: Pulmonary effort is normal. No respiratory distress.  Skin:    General: Skin is warm and dry.  Neurological:     Comments: Obtunded, unable to follow commands     Vital Signs: BP (!) 147/82   Pulse (!) 114   Temp (!) 101.7 F (38.7 C)   Resp (!) 25   Ht 5\' 3"  (1.6 m)   Wt 69.9 kg   SpO2 92%   BMI 27.30 kg/m  Pain Scale: CPOT   Pain Score: Asleep   LBM: Last BM Date : 06/25/22 Baseline Weight: Weight: 69 kg Most recent weight: Weight: 69.9 kg      Assessment and Plan  SUMMARY OF RECOMMENDATIONS   DNR/DNI Anticipate transition to comfort care 4/29 per family wishes PMT to follow to assist with transition and for ongoing support  Discussed With: Nursing staff and CCM team   Thank you for this consult and allowing Palliative Medicine to participate in the care of Denese Killings. Palliative medicine will continue to follow and assist as needed.   Time Total: 55 minutes  Time spent includes: Detailed review of medical records (labs, imaging, vital signs), medically appropriate exam (mental status, respiratory, cardiac, skin), discussed with treatment team, counseling and educating  patient, family and staff, documenting clinical information, medication management and coordination of care.   Signed by: Leeanne Deed, DNP, AGNP-C Palliative Medicine    Please contact Palliative Medicine Team phone at [phone] for questions and concerns.  For individual provider: See Loretha Stapler

## 2022-06-27 NOTE — Progress Notes (Signed)
1930 patient unresponsive only moves right side to pain unable to make needs known family at bedside daughter states at 11am 06/28/22 they will go comfort care.  2000 PRN pain meds given for increase in HR  2100 increase 02 after repositioning to left side

## 2022-06-28 DIAGNOSIS — A419 Sepsis, unspecified organism: Secondary | ICD-10-CM | POA: Diagnosis not present

## 2022-06-28 DIAGNOSIS — R6520 Severe sepsis without septic shock: Secondary | ICD-10-CM | POA: Diagnosis not present

## 2022-06-28 LAB — CSF CULTURE W GRAM STAIN: Culture: NO GROWTH

## 2022-06-28 LAB — CULTURE, BLOOD (ROUTINE X 2): Culture: NO GROWTH

## 2022-06-28 LAB — EYE CULTURE W GRAM STAIN: Gram Stain: NONE SEEN

## 2022-06-28 LAB — GLUCOSE, CAPILLARY
Glucose-Capillary: 154 mg/dL — ABNORMAL HIGH (ref 70–99)
Glucose-Capillary: 153 mg/dL — ABNORMAL HIGH (ref 70–99)

## 2022-06-28 MED ORDER — GLYCOPYRROLATE 0.2 MG/ML IJ SOLN: 0.2000 mg | INTRAMUSCULAR | Status: DC | PRN

## 2022-06-28 MED ORDER — MIDAZOLAM HCL 2 MG/2ML IJ SOLN: 2.0000 mg | INTRAMUSCULAR | Status: DC | PRN

## 2022-06-28 MED ORDER — SODIUM CHLORIDE 0.9 % IV SOLN: INTRAVENOUS | Status: DC

## 2022-06-28 MED ORDER — ACETAMINOPHEN 650 MG RE SUPP: 650.0000 mg | Freq: Four times a day (QID) | RECTAL | Status: DC | PRN

## 2022-06-28 MED ORDER — POLYVINYL ALCOHOL 1.4 % OP SOLN: 1.0000 [drp] | Freq: Four times a day (QID) | OPHTHALMIC | Status: DC | PRN

## 2022-06-28 MED ORDER — MORPHINE BOLUS VIA INFUSION
5.0000 mg | INTRAVENOUS | Status: DC | PRN
  Administered 2022-06-28 (×3): 5 mg via INTRAVENOUS

## 2022-06-28 MED ORDER — ACETAMINOPHEN 325 MG PO TABS: 650.0000 mg | ORAL_TABLET | Freq: Four times a day (QID) | ORAL | Status: DC | PRN

## 2022-06-28 MED ORDER — MORPHINE 100MG IN NS 100ML (1MG/ML) PREMIX INFUSION
0.0000 mg/h | INTRAVENOUS | Status: DC
  Administered 2022-06-28: 20 mg/h via INTRAVENOUS
  Administered 2022-06-28: 5 mg/h via INTRAVENOUS
  Administered 2022-06-28 – 2022-06-29 (×2): 20 mg/h via INTRAVENOUS
  Filled 2022-06-28 (×5): qty 100

## 2022-06-28 MED ORDER — GLYCOPYRROLATE 1 MG PO TABS: 1.0000 mg | ORAL_TABLET | ORAL | Status: DC | PRN

## 2022-06-28 MED ORDER — GLYCOPYRROLATE 0.2 MG/ML IJ SOLN
0.2000 mg | INTRAMUSCULAR | Status: DC | PRN
  Administered 2022-06-28: 0.2 mg via INTRAVENOUS
  Filled 2022-06-28: qty 1

## 2022-06-28 NOTE — IPAL (Signed)
  Interdisciplinary Goals of Care Family Meeting   Date carried out: 06/28/2022  Location of the meeting: Bedside  Member's involved: Physician, Bedside Registered Nurse, and Family Member or next of kin    GOALS OF CARE DISCUSSION  The Clinical status was relayed to family in detail-Children and Husband  Updated and notified of patients medical condition- Patient remains unresponsive and will not open eyes to command.   Patient is having a weak cough and struggling to remove secretions.   Patient with increased WOB and using accessory muscles to breathe Explained to family course of therapy and the modalities   Patient with Progressive multiorgan failure with a very high probablity of a very minimal chance of meaningful recovery despite all aggressive and optimal medical therapy.  PATIENT REMAINS DNR/DNI  Family understands the situation.  They have consented and agreed to DNR/DNI and would like to proceed with Comfort care measures.  Family are satisfied with Plan of action and management. All questions answered  Additional CC time 25 mins   Jhoel Stieg Santiago Glad, M.D.  [facility]  Medical Director [facility] Medical Director [facility] Cardio-Pulmonary Department

## 2022-06-28 NOTE — Progress Notes (Signed)
                                                     Palliative Care Progress Note, Assessment & Plan   Patient Name: Sherry Mosley       Date: 06/28/2022 DOB: 01-03-1946  Age: 77 y.o. MRN#: [id number] Attending Physician: Erin Fulling, MD Primary Care Physician: Sharon Seller, NP Admit Date: 06/23/2022  Subjective: Patient is lying in bed in no apparent distress.  Her respirations are even and unlabored.  No brow furrowing or not on verbal signs of pain or distress noted. Family is at bedside.  HPI: 77 y.o. female  with past medical history of Non-Hodgkin's Lymphoma, HTN, HLD  admitted on 06/16/2022 with acute hypoxic respiratory failure secondary to severe sepsis from strep pneumo bacteremia in the setting of right sided pneumonia and loculated right sided pleural effusion. AKI also noted.   EMS called by her husband-Ned due to altered mental status. Park Breed shares that [name] was recently diagnosed with conjunctivitis by her ophthalmologist. Remote complaint of right sided rib pain but she did not seek medical care for this complaint. Over the last few days he reports increased fatigue and weakness along with multiple falls.    CT chest/abdomen/pelvis>>Examination is generally limited by breath motion artifact and lack of intravenous contrast. Moderate, loculated right pleural effusion with extensive associated atelectasis or consolidation. No overlying rib fracture. Large contusion and hematoma of the left buttock. No significant change in retroperitoneal, iliac, inguinal, and mesenteric lymphadenopathy, as well as splenomegaly, in keeping with history of lymphoma. Hepatomegaly. Aortic Atherosclerosis   4/25: BiPAP, CCM confirmed DNR/DNI with family, multiorgan failure, chest tube placed by IR 4/27: Remained encephalopathic, HHFNC 50% FiO2 4/28: LP positive  for strep meningitis, remains febrile, remains encephalopathic, remains on [facility]   Palliative medicine was consulted for assisting with goals of care conversations.  Summary of counseling/coordination of care: After reviewing the patient's chart and assessing the patient, I counseled with dayshift RN and attending Dr. Belia Heman.  Full comfort measures initiated this morning and in place at this time.  No adjustments to medications needed at this time.  Patient appears comfortable and in no apparent distress. Morphine gtt is appropriately managing patient's symptoms at this time.  PMT will continue to follow and remain available to patient and family throughout her hospitalization. If patient is stable tomorrow, I will discuss hospice placement with family.   Physical Exam Constitutional:      General: She is not in acute distress.    Appearance: Normal appearance. She is normal weight. She is not toxic-appearing.  HENT:     Head: Normocephalic and atraumatic.  Cardiovascular:     Rate and Rhythm: Tachycardia present.  Pulmonary:     Effort: Pulmonary effort is normal. No respiratory distress.             Total Time 25 minutes   Gracen Southwell L. Manon Hilding, FNP-BC Palliative Medicine Team Team Phone # [phone]

## 2022-06-28 NOTE — Progress Notes (Signed)
Family remain at bedside. Patient resting comfortably. Shallow, non-labored irregular respirations. Morphine drip infusing.

## 2022-06-28 NOTE — Progress Notes (Signed)
NAME:  Sherry Mosley, MRN:  [id number], DOB:  07/28/1945, LOS: 4 ADMISSION DATE:  06/06/2022, CONSULTATION DATE:  06/15/2022 REFERRING MD:  Dr. Fanny Bien, CHIEF COMPLAINT:  Altered Mental Status   Brief Pt Description / Synopsis:  77 y.o. female with PMHx significant for Non Hodgkin's Lymphoma, who is admitted with Acute Metabolic Encephalopathy and Acute Hypoxic Respiratory Failure due to Severe Sepsis from STREP PNEUMO BACTEREMIA in setting of Right sided Pneumonia and loculated right sided pleural effusion, along with Acute Kidney Injury and Anion Gap metabolic acidosis.  History of Present Illness:  Sherry Mosley is a 77 year old female with a past medical history significant for non-Hodgkin's lymphoma, hypertension, hyperlipidemia who presents to [facility] ED on 06/11/2022 due to altered mental status.  Patient is currently altered and unable to contribute to history, therefore history is obtained from patient's husband at bedside along with chart review.  Per the patient's husband, she was seen a couple days ago by her ophthalmologist and diagnosed with conjunctivitis and was prescribed antibiotic drops.  He also reports that she was complaining of soreness with her right ribs, however she did not want to seek medical care at that time.  He reports she has had increasing fatigue and weakness along with multiple falls.  The night before last she fell onto her buttocks, did not hit her head, and he was able to get her back to bed.  This morning she was extremely confused prompting him to contact EMS for further medical evaluation.  She met sepsis criteria therefore she was given IV fluid resuscitation along with broad-spectrum antibiotics.  Blood cultures were drawn.  Patient's husband at bedside and her son who is an ED physician in [place] both verify that patient is to be a DNR/DNI given her previous wishes.  PCCM is asked to admit for further workup and treatment.  Please see "significant hospital  events" section below for full detailed hospital course.  Pertinent  Medical History   Past Medical History:  Diagnosis Date   Generalized osteoarthritis    H/O benign breast biopsy 1964   H/O bilateral cataract extraction 2005   Hyperlipidemia    Hypertension    Mood disorder (HCC)    Non Hodgkin's lymphoma (HCC)    Pancreatitis 10/20/2012   unknown etiology   Postmenopausal    S/P ankle arthrodesis 2012   Right   Sleep disorder    Uterine fibroid    Micro Data:  4/25: SARS-CoV-2 PCR>> negative 4/25: Respiratory viral panel>>negative 4/25: Blood culture x 2>>Streptococcus pneumoniae  4/25: Strep pneumo urinary antigen>>positive  4/25: Legionella urine antigen>>negative  4/25: Pleural fluid>> 4/25: MRSA PCR>>negative 4/27: CSF fluid>>strep pneumonia   Antimicrobials:   Anti-infectives (From admission, onward)    Start     Dose/Rate Route Frequency Ordered Stop   06/28/22 1000  cefTRIAXone (ROCEPHIN) 2 g in sodium chloride 0.9 % 100 mL IVPB  Status:  Discontinued        2 g 200 mL/hr over 30 Minutes Intravenous Every 24 hours 06/27/22 1017 06/27/22 1018   06/28/22 1000  cefTRIAXone (ROCEPHIN) 2 g in sodium chloride 0.9 % 100 mL IVPB  Status:  Discontinued        2 g 200 mL/hr over 30 Minutes Intravenous Every 24 hours 06/27/22 1018 06/28/22 0849   06/26/22 1215  cefTRIAXone (ROCEPHIN) 2 g in sodium chloride 0.9 % 100 mL IVPB  Status:  Discontinued        2 g 200 mL/hr over 30 Minutes Intravenous Every  12 hours 06/26/22 1125 06/27/22 1017   06/26/22 1000  acyclovir (ZOVIRAX) 700 mg in dextrose 5 % 100 mL IVPB  Status:  Discontinued        10 mg/kg  69.9 kg 114 mL/hr over 60 Minutes Intravenous Every 12 hours 06/26/22 0832 06/26/22 2059   06/26/22 0930  vancomycin (VANCOCIN) IVPB 1000 mg/200 mL premix        1,000 mg 200 mL/hr over 60 Minutes Intravenous  Once 06/26/22 0838 06/26/22 1113   06/25/22 1930  ceFEPIme (MAXIPIME) 2 g in sodium chloride 0.9 % 100 mL IVPB   Status:  Discontinued        2 g 200 mL/hr over 30 Minutes Intravenous Every 24 hours 06/25/22 1829 06/26/22 1125   06/25/22 1630  vancomycin (VANCOCIN) IVPB 1000 mg/200 mL premix        1,000 mg 200 mL/hr over 60 Minutes Intravenous  Once 06/25/22 1539 06/26/22 0455   06/07/2022 2300  metroNIDAZOLE (FLAGYL) IVPB 500 mg  Status:  Discontinued        500 mg 100 mL/hr over 60 Minutes Intravenous Every 12 hours 06/22/2022 2121 06/27/22 1017   06/04/2022 2200  aztreonam (AZACTAM) 1 g in sodium chloride 0.9 % 100 mL IVPB  Status:  Discontinued        1 g 200 mL/hr over 30 Minutes Intravenous Every 8 hours 06/13/2022 1302 06/25/22 0336   06/15/2022 1257  vancomycin variable dose per unstable renal function (pharmacist dosing)  Status:  Discontinued         Does not apply See admin instructions 05/31/2022 1302 06/27/22 1017   06/02/2022 1245  metroNIDAZOLE (FLAGYL) IVPB 500 mg        500 mg 100 mL/hr over 60 Minutes Intravenous  Once 06/20/2022 1242 06/06/2022 1349   06/07/2022 1100  vancomycin (VANCOREADY) IVPB 1500 mg/300 mL        1,500 mg 150 mL/hr over 120 Minutes Intravenous  Once 06/03/2022 1032 06/14/2022 1338   06/06/2022 1030  aztreonam (AZACTAM) 2 g in sodium chloride 0.9 % 100 mL IVPB        2 g 200 mL/hr over 30 Minutes Intravenous  Once 06/28/2022 1021 06/08/2022 1133   06/28/2022 1030  vancomycin (VANCOCIN) IVPB 1000 mg/200 mL premix  Status:  Discontinued        1,000 mg 200 mL/hr over 60 Minutes Intravenous  Once 06/05/2022 1021 06/13/2022 1032      Significant Hospital Events: Including procedures, antibiotic start and stop dates in addition to other pertinent events   4/25: Presented to ED with altered mental status and acute respiratory failure requiring BiPAP.  Patient is DNR/DNI per her previous wishes.  Critically ill with multiorgan failure, PCCM asked to admit.  IR placed chest tube for loculated right-sided pleural effusion. 4/26: Blood cultures with Strep Pneumo, consult ID.  Pleural fluid consistent  Exudative effusion, pleural culture still pending. Chest tube with minimal drainage, plan to instill tpa/dornase.  AKI slowly improving, respiratory status improving, weaned off BiPAP to [place].  Remains encephalopathic, EEG pending 4/27: Pt remains encephalopathic unable to follow commands.  On HHFNC FiO2 @50 %.  EEG results pending.  Pt febrile temp 102.9.  Pt pending LP per IR.  Will start acyclovir given lack of improvement in mentation.  Pt received TPA via chest tube x2 doses  4/28: LP positive for strep meningitis.  Mentation not improved and pt remains febrile.   Continue TPA bid via chest tube today due to loculated right pleural  effusion  4/29 severe hypoxia and severe encephalopathy  Interim History / Subjective:  Severe hypoxia Patient is struggling to breathe Dying process Obtunded   Objective   Blood pressure (!) 158/88, pulse (!) 117, temperature 99.7 F (37.6 C), temperature source Rectal, resp. rate (!) 21, height 5\' 3"  (1.6 m), weight 69.9 kg, SpO2 92 %.    FiO2 (%):  [55 %-90 %] 90 %   Intake/Output Summary (Last 24 hours) at 06/28/2022 0902 Last data filed at 06/28/2022 0900 Gross per 24 hour  Intake 1992.65 ml  Output 1685 ml  Net 307.65 ml    Filed Weights   06/22/2022 1046 06/17/2022 1430 06/25/22 0500  Weight: 69 kg 67.7 kg 69.9 kg     REVIEW OF SYSTEMS  PATIENT IS UNABLE TO PROVIDE COMPLETE REVIEW OF SYSTEMS DUE TO SEVERE CRITICAL ILLNESS   PHYSICAL EXAMINATION:  GENERAL:critically ill appearing, +resp distress EYES: Pupils equal, round, reactive to light.  No scleral icterus.  MOUTH: Moist mucosal membrane. NECK: Supple.  PULMONARY: Lungs clear to auscultation, +rhonchi, +wheezing CARDIOVASCULAR: S1 and S2.  Regular rate and rhythm GASTROINTESTINAL: Soft, nontender, -distended. Positive bowel sounds.  MUSCULOSKELETAL: edema.  NEUROLOGIC: obtunded SKIN:normal, warm to touch, Capillary refill delayed  Pulses present bilaterally         Resolved  Hospital Problem list   AG Metabolic Acidosis Hyponatremia   Assessment & Plan:   Acute Hypoxic Respiratory Failure in the setting of Pneumonia & Right sided Loculated Pleural Effusion Patient with severe hypoxia and increased WOB S/p TPA in chest tube  Severe Sepsis in setting of STREPTOCOCCUS PNEUMONIAE BACTEREMIA due to Pneumonia & Loculated pleural effusion  Right eye Conjunctivitis (present on admission, was being treat outpatient)   Mildly Elevated Troponin, suspect demand ischemia Tachycardia due to Sepsis  Acute kidney Injury~ Lactic acidosis   Acute Metabolic Encephalopathy in setting of strep meningitis    Patient is critically ill with multiorgan failure.  Prognosis is guarded, high risk for further decompensation, cardiac arrest and death.  Palliative Care consulted  Best Practice (right click and "Reselect all SmartList Selections" daily)  Diet/type: NPO DVT prophylaxis: prophylactic heparin  GI prophylaxis: N/A Lines: N/A Foley:  yes, and is still needed Code Status:  DNR Last date of multidisciplinary goals of care discussion [06/27/22]  4/28: Updated pts daughter and husband regarding pts condition and current plan of care  Labs   CBC: Recent Labs  Lab 06/05/2022 1009 06/25/22 0510 06/26/22 0358 06/27/22 0356  WBC 25.3* 20.1* 24.8* 19.9*  NEUTROABS 21.4*  --   --   --   HGB 11.9* 9.5* 10.1* 10.4*  HCT 34.4* 27.7* 29.8* 31.2*  MCV 90.1 90.2 89.8 90.4  PLT 232 175 237 249     Basic Metabolic Panel: Recent Labs  Lab 06/21/2022 2120 06/25/22 0508 06/25/22 1605 06/26/22 0358 06/26/22 0400 06/27/22 0356  NA 133* 135 140  --  140 142  K 4.1 3.7 3.8  --  4.3 4.4  CL 99 98 102  --  100 103  CO2 23 25 20*  --  26 26  GLUCOSE 163* 122* 117*  --  147* 174*  BUN 54* 51* 50*  --  59* 72*  CREATININE 1.99* 1.97* 1.63*  --  1.50* 1.33*  CALCIUM 8.2* 8.1* 9.3  --  8.3* 8.1*  MG  --   --   --  2.6*  --   --   PHOS  --  6.1*  --   --  4.6 3.8     GFR: Estimated Creatinine Clearance: 32.7 mL/min (A) (by C-G formula based on SCr of 1.33 mg/dL (H)). Recent Labs  Lab 06/27/2022 1009 06/28/2022 1245 06/13/2022 1440 06/11/2022 1810 06/25/22 0510 06/25/22 1526 06/26/22 0358 06/26/22 0808 06/27/22 0356  PROCALCITON 4.04  --   --   --  21.81  --  14.75 14.73  --   WBC 25.3*  --   --   --  20.1*  --  24.8*  --  19.9*  LATICACIDVEN 4.5* 2.0* 1.4 2.2*  --  2.8*  --   --   --      Liver Function Tests: Recent Labs  Lab 06/06/2022 1009 06/14/2022 1722 06/25/22 0508 06/25/22 0510 06/26/22 0358 06/26/22 0400 06/27/22 0356  AST 40  --   --  26 21  --   --   ALT 28  --   --  20 19  --   --   ALKPHOS 197*  --   --  142* 139*  --   --   BILITOT 3.9*  --   --  3.8* 2.9*  --   --   PROT 7.2 5.9*  --  6.2* 6.6  --   --   ALBUMIN 2.2*  --  1.8* 1.9* 1.9* 1.9* 1.8*    No results for input(s): "LIPASE", "AMYLASE" in the last 168 hours. Recent Labs  Lab 06/04/2022 1009  AMMONIA 11     ABG    Component Value Date/Time   HCO3 28.9 (H) 06/26/2022 0808   ACIDBASEDEF 0.2 06/23/2022 2120   O2SAT 76.9 06/26/2022 0808     Coagulation Profile: Recent Labs  Lab 06/09/2022 1009  INR 1.3*     Cardiac Enzymes: Recent Labs  Lab 06/15/2022 1009  CKTOTAL 111     HbA1C: Hgb A1c MFr Bld  Date/Time Value Ref Range Status  06/25/2022 05:08 AM 5.6 4.8 - 5.6 % Final    Comment:    (NOTE) Pre diabetes:          5.7%-6.4%  Diabetes:              >6.4%  Glycemic control for   <7.0% adults with diabetes     CBG: Recent Labs  Lab 06/27/22 1547 06/27/22 1959 06/27/22 2312 06/28/22 0347 06/28/22 0733  GLUCAP 156* 180* 162* 154* 153*      DVT/GI PRX  assessed I Assessed the need for Labs I Assessed the need for Foley I Assessed the need for Central Venous Line Family Discussion when available I Assessed the need for Mobilization I made an Assessment of medications to be adjusted accordingly Safety Risk assessment  completed  CASE DISCUSSED IN MULTIDISCIPLINARY ROUNDS WITH ICU TEAM     Critical Care Time devoted to patient care services described in this note is 55 minutes.  Critical care was necessary to treat /prevent imminent and life-threatening deterioration. Overall, patient is critically ill, prognosis is guarded.  Patient with Multiorgan failure and at high risk for cardiac arrest and death.    Lucie Leather, M.D.  [facility]  Medical Director [facility] Medical Director [facility] Cardio-Pulmonary Department

## 2022-06-28 NOTE — Progress Notes (Signed)
Morphine drip started per son's request. Family at bedside and prayed. High flow nasal cannula removed per family's request. Comfort measures initiated. Patient resting comfortably at this time. Husband, son, daughter and other family members at bedside.

## 2022-06-28 NOTE — Progress Notes (Signed)
RN had increased FIO2 to 75% earlier on. Had to increase to 90% due to sats in the 80s. Patient now sating 91% on 55L and 90% HFNC.

## 2022-06-29 DIAGNOSIS — R7401 Elevation of levels of liver transaminase levels: Secondary | ICD-10-CM | POA: Insufficient documentation

## 2022-06-29 DIAGNOSIS — R7989 Other specified abnormal findings of blood chemistry: Secondary | ICD-10-CM | POA: Insufficient documentation

## 2022-06-29 DIAGNOSIS — R7881 Bacteremia: Secondary | ICD-10-CM | POA: Insufficient documentation

## 2022-06-29 DIAGNOSIS — R739 Hyperglycemia, unspecified: Secondary | ICD-10-CM | POA: Insufficient documentation

## 2022-06-29 DIAGNOSIS — H1031 Unspecified acute conjunctivitis, right eye: Secondary | ICD-10-CM | POA: Insufficient documentation

## 2022-06-29 DIAGNOSIS — D649 Anemia, unspecified: Secondary | ICD-10-CM | POA: Insufficient documentation

## 2022-06-29 DIAGNOSIS — J9601 Acute respiratory failure with hypoxia: Secondary | ICD-10-CM | POA: Insufficient documentation

## 2022-06-29 LAB — CSF CULTURE W GRAM STAIN

## 2022-06-29 LAB — CULTURE, BLOOD (ROUTINE X 2)

## 2022-06-29 LAB — VDRL, CSF: VDRL Quant, CSF: NONREACTIVE

## 2022-06-29 LAB — CYTOLOGY - NON PAP

## 2022-06-30 LAB — CULTURE, BLOOD (ROUTINE X 2)

## 2022-06-30 NOTE — Progress Notes (Signed)
Patient was pronouced dead by this RN and Brock Bad RN at [phone].

## 2022-06-30 NOTE — Death Summary Note (Signed)
DEATH SUMMARY   Patient Details  Name: Sherry Mosley MRN: [id number] DOB: 07-12-1945  Admission/Discharge Information   Admit Date:  Jul 02, 2022  Date of Death: Date of Death: 07/07/2022  Time of Death: Time of Death: 07/15/2031  Length of Stay: 5  Referring Physician: Sharon Seller, NP   Reason(s) for Hospitalization  Acute Metabolic Encephalopathy and Acute Hypoxic Respiratory Failure due to Severe Sepsis from Right sided Pneumonia and loculated right sided pleural effusion, along with Acute Kidney Injury and Anion Gap metabolic acidosis.   Diagnoses  Preliminary cause of death:  Secondary Diagnoses (including complications and co-morbidities):  Principal Problem:   Severe sepsis (HCC) Active Problems:   AKI (acute kidney injury) (HCC)   Bacteremia due to Streptococcus pneumoniae   Acute respiratory failure with hypoxia (HCC)   Elevated troponin   Acute bacterial conjunctivitis of right eye   Normocytic normochromic anemia   Hyperglycemia   Transaminitis   Brief Hospital Course (including significant findings, care, treatment, and services provided and events leading to death)  Sherry Mosley is a 77 y.o. year old female who with a past medical history significant for non-Hodgkin's lymphoma, hypertension, hyperlipidemia who presents to [facility] ED on Jul 02, 2022 due to altered mental status.  Patient is currently altered and unable to contribute to history, therefore history is obtained from patient's husband at bedside along with chart review.   Per the patient's husband, she was seen a couple days ago by her ophthalmologist and diagnosed with conjunctivitis and was prescribed antibiotic drops.  He also reports that she was complaining of soreness with her right ribs, however she did not want to seek medical care at that time.  He reports she has had increasing fatigue and weakness along with multiple falls.  The night before last she fell onto her buttocks, did not hit her head, and he was  able to get her back to bed.  This morning she was extremely confused prompting him to contact EMS for further medical evaluation.She met sepsis criteria therefore she was given IV fluid resuscitation along with broad-spectrum antibiotics.  Blood cultures were drawn.   Patient's husband at bedside and her son who is an ED physician in [place] both verify that patient is to be a DNR/DNI given her previous wishes. PCCM is asked to admit for further workup and treatment.  Jul 02, 2022: Presented to ED with altered mental status and acute respiratory failure requiring BiPAP.  Patient is DNR/DNI per her previous wishes.  Critically ill with multiorgan failure, PCCM asked to admit.  IR placed chest tube for loculated right-sided pleural effusion. 4/26: Blood cultures with Strep Pneumo, consult ID.  Pleural fluid consistent Exudative effusion, pleural culture still pending. Chest tube with minimal drainage, plan to instill tpa/dornase.  AKI slowly improving, respiratory status improving, weaned off BiPAP to [place].  Remains encephalopathic, EEG pending 4/27: Pt remains encephalopathic unable to follow commands.  On HHFNC FiO2 @50 %.  EEG results pending.  Pt febrile temp 102.9.  Pt pending LP per IR.  Will start acyclovir given lack of improvement in mentation.  Pt received TPA via chest tube x2 doses  4/28: LP positive for strep meningitis.  Mentation not improved and pt remains febrile.   Continue TPA bid via chest tube today due to loculated right pleural effusion  4/29: GOC discussion had with family. Patient with progressive multiorgan failure and grave prognosis. Decision made to transition to comfort measures.  Patient passed away with family bedside.  Pertinent Labs and Studies  Significant  Diagnostic Studies DG Chest Port 1 View  Result Date: 06/27/2022 CLINICAL DATA:  Acute respiratory failure with hypoxia. EXAM: PORTABLE CHEST 1 VIEW COMPARISON:  06/26/2022 FINDINGS: The cardio pericardial silhouette is  enlarged. Right base collapse/consolidation with effusion is similar to prior. Right pleural drain remains in place. There is some minimal atelectasis at the left base. Telemetry leads overlie the chest. IMPRESSION: No substantial interval change. Right base collapse/consolidation with effusion. Electronically Signed   By: Kennith Center M.D.   On: 06/27/2022 09:02   DG FL GUIDED LUMBAR PUNCTURE  Result Date: 06/26/2022 CLINICAL DATA:  History of non-Hodgkin's lymphoma admitted for acute metabolic encephalopathy and acute hypoxic respiratory failure due to sepsis. Request received for diagnostic lumbar puncture due to concern for strep meningitis. EXAM: DIAGNOSTIC LUMBAR PUNCTURE UNDER FLUOROSCOPIC GUIDANCE COMPARISON:  None Available. FLUOROSCOPY: Radiation Exposure Index (as provided by the fluoroscopic device): 19.00 mGy Kerma PROCEDURE: Informed consent was obtained from the patient prior to the procedure, including potential complications of headache, allergy, and pain. With the patient prone, the lower back was prepped with Betadine. 1% Lidocaine was used for local anesthesia. Lumbar puncture was performed at the L4-L5 level using a 20 gauge needle with return of cloudy, amber CSF with an opening pressure of 21 cm water. 18 ml of CSF were obtained for laboratory studies. Closing pressure 11 cm water. The patient tolerated the procedure well and there were no apparent complications. Procedure was performed by Alex Gardener, NP and supervised by Gerome Sam, MD IMPRESSION: Technically successful fluoroscopic guided lumbar puncture. Read by: Alex Gardener, AGNP-BC Electronically Signed   By: Gerome Sam III M.D.   On: 06/26/2022 13:29   ECHOCARDIOGRAM COMPLETE  Result Date: 06/26/2022    ECHOCARDIOGRAM REPORT   Patient Name:   Sherry Mosley Date of Exam: 06/26/2022 Medical Rec #:  [id number]    Height:       63.0 in Accession #:    [id number]   Weight:       154.1 lb Date of Birth:  June 28, 1945     BSA:           1.731 m Patient Age:    77 years     BP:           153/77 mmHg Patient Gender: F            HR:           123 bpm. Exam Location:  [facility] Procedure: 2D Echo, Color Doppler and Cardiac Doppler Indications:    Bacteremia R78.81  History:        Patient has no prior history of Echocardiogram examinations.                 Severe sepsis, Signs/Symptoms:Fatigue; Risk Factors:Hypertension                 and Dyslipidemia.  Sonographer:    Louie Boston RDCS Referring Phys: [id number] [facility] IMPRESSIONS  1. Left ventricular ejection fraction, by estimation, is 60 to 65%. The left ventricle has normal function. The left ventricle has no regional wall motion abnormalities. Left ventricular diastolic parameters are consistent with Grade I diastolic dysfunction (impaired relaxation).  2. Right ventricular systolic function is normal. The right ventricular size is normal. There is normal pulmonary artery systolic pressure. The estimated right ventricular systolic pressure is 17.3 mmHg.  3. The mitral valve is normal in structure. Trivial mitral valve regurgitation. No evidence of mitral stenosis.  4. The aortic valve is  tricuspid. Aortic valve regurgitation is not visualized. Aortic valve sclerosis is present, with no evidence of aortic valve stenosis.  5. The inferior vena cava is normal in size with greater than 50% respiratory variability, suggesting right atrial pressure of 3 mmHg. FINDINGS  Left Ventricle: Left ventricular ejection fraction, by estimation, is 60 to 65%. The left ventricle has normal function. The left ventricle has no regional wall motion abnormalities. The left ventricular internal cavity size was normal in size. There is  no left ventricular hypertrophy. Left ventricular diastolic parameters are consistent with Grade I diastolic dysfunction (impaired relaxation). Right Ventricle: The right ventricular size is normal. No increase in right ventricular wall thickness. Right ventricular systolic  function is normal. There is normal pulmonary artery systolic pressure. The tricuspid regurgitant velocity is 1.89 m/s, and  with an assumed right atrial pressure of 3 mmHg, the estimated right ventricular systolic pressure is 17.3 mmHg. Left Atrium: Left atrial size was normal in size. Right Atrium: Right atrial size was normal in size. Pericardium: There is no evidence of pericardial effusion. Mitral Valve: The mitral valve is normal in structure. Trivial mitral valve regurgitation. No evidence of mitral valve stenosis. Tricuspid Valve: The tricuspid valve is normal in structure. Tricuspid valve regurgitation is mild . No evidence of tricuspid stenosis. Aortic Valve: The aortic valve is tricuspid. Aortic valve regurgitation is not visualized. Aortic valve sclerosis is present, with no evidence of aortic valve stenosis. Pulmonic Valve: The pulmonic valve was normal in structure. Pulmonic valve regurgitation is not visualized. No evidence of pulmonic stenosis. Aorta: The aortic root is normal in size and structure. Venous: The inferior vena cava is normal in size with greater than 50% respiratory variability, suggesting right atrial pressure of 3 mmHg. IAS/Shunts: No atrial level shunt detected by color flow Doppler.  LEFT VENTRICLE PLAX 2D LVIDd:         3.60 cm   Diastology LVIDs:         2.10 cm   LV e' medial:    10.60 cm/s LV PW:         0.80 cm   LV E/e' medial:  6.6 LV IVS:        0.80 cm   LV e' lateral:   7.36 cm/s LVOT diam:     2.00 cm   LV E/e' lateral: 9.5 LV SV:         62 LV SV Index:   36 LVOT Area:     3.14 cm  RIGHT VENTRICLE          IVC RV Basal diam:  2.50 cm  IVC diam: 1.01 cm LEFT ATRIUM             Index        RIGHT ATRIUM          Index LA diam:        2.50 cm 1.44 cm/m   RA Area:     6.64 cm LA Vol (A2C):   28.6 ml 16.52 ml/m  RA Volume:   9.50 ml  5.49 ml/m LA Vol (A4C):   14.3 ml 8.26 ml/m LA Biplane Vol: 21.1 ml 12.19 ml/m  AORTIC VALVE LVOT Vmax:   148.00 cm/s LVOT Vmean:   102.500 cm/s LVOT VTI:    0.196 m  AORTA Ao Root diam: 3.10 cm Ao Asc diam:  3.10 cm Ao Desc diam: 2.40 cm MITRAL VALVE  TRICUSPID VALVE MV Area (PHT): 5.54 cm     TR Peak grad:   14.3 mmHg MV Decel Time: 137 msec     TR Vmax:        189.00 cm/s MV E velocity: 70.20 cm/s MV A velocity: 112.00 cm/s  SHUNTS MV E/A ratio:  0.63         Systemic VTI:  0.20 m                             Systemic Diam: 2.00 cm Julien Nordmann MD Electronically signed by Julien Nordmann MD Signature Date/Time: 06/26/2022/1:16:01 PM    Final    EEG adult  Result Date: 06/26/2022 Jefferson Fuel, MD     06/26/2022  9:34 AM Routine EEG Report Bostyn Kunkler is a 77 y.o. female with a history of altered mental statuys who is undergoing an EEG to evaluate for seizures. Report: This EEG was acquired with electrodes placed according to the International 10-20 electrode system (including Fp1, Fp2, F3, F4, C3, C4, P3, P4, O1, O2, T3, T4, T5, T6, A1, A2, Fz, Cz, Pz). The following electrodes were missing or displaced: none. The occipital dominant rhythm was 6-7 Hz. This activity is reactive to stimulation. Drowsiness was manifested by background fragmentation; deeper stages of sleep were identified by K complexes and sleep spindles. There was focal right-sided slowing. There were no interictal epileptiform discharges. There were no electrographic seizures identified. There was no abnormal response to photic stimulation or hyperventilation. Impression and clinical correlation: This EEG was obtained while awake and asleep and is abnormal due to: - mild diffuse slowing indicative of global cerebral dysfunction - Right focal slowing indicative of superimposed focal cerebral dysfunction in that area Epileptiform abnormalities were not seen during this recording. Bing Neighbors, MD [facility] [phone] If 7pm- 7am, please page neurology on call as listed in AMION.   DG Chest Port 1 View  Result Date: 06/26/2022 CLINICAL  DATA:  Chest tube in place. EXAM: PORTABLE CHEST 1 VIEW COMPARISON:  06/25/2022 FINDINGS: Again noted is a small bore chest tube in the right lower chest. Position of the chest tube is stable with the radiopaque marker just lateral to the ribs. Persistent densities in right lower chest compatible with residual consolidation and pleural fluid. Cannot exclude slight enlargement of the right pleural effusion. Hazy densities throughout the right lung are minimally changed. Left lung remains stable without gross abnormality. Cardiomediastinal silhouette is stable. Negative for pneumothorax. IMPRESSION: 1. Stable position of the right chest tube. 2. Persistent densities in the right lower chest compatible with residual consolidation and pleural fluid. Cannot exclude slight enlargement of the right pleural effusion. 3.  Negative for a pneumothorax. Electronically Signed   By: Richarda Overlie M.D.   On: 06/26/2022 08:16   US Venous Img Lower Bilateral (DVT)  Result Date: 06/25/2022 CLINICAL DATA:  Pain and swelling EXAM: BILATERAL LOWER EXTREMITY VENOUS DOPPLER ULTRASOUND TECHNIQUE: Gray-scale sonography with graded compression, as well as color Doppler and duplex ultrasound were performed to evaluate the lower extremity deep venous systems from the level of the common femoral vein and including the common femoral, femoral, profunda femoral, popliteal and calf veins including the posterior tibial, peroneal and gastrocnemius veins when visible. The superficial great saphenous vein was also interrogated. Spectral Doppler was utilized to evaluate flow at rest and with distal augmentation maneuvers in the common femoral, femoral and popliteal veins. COMPARISON:  None Available. FINDINGS: RIGHT  LOWER EXTREMITY Common Femoral Vein: No evidence of thrombus. Normal compressibility, respiratory phasicity and response to augmentation. Saphenofemoral Junction: No evidence of thrombus. Normal compressibility and flow on color Doppler  imaging. Profunda Femoral Vein: No evidence of thrombus. Normal compressibility and flow on color Doppler imaging. Femoral Vein: No evidence of thrombus. Normal compressibility, respiratory phasicity and response to augmentation. Popliteal Vein: No evidence of thrombus. Normal compressibility, respiratory phasicity and response to augmentation. Calf Veins: No evidence of thrombus. Normal compressibility and flow on color Doppler imaging. Superficial Great Saphenous Vein: No evidence of thrombus. Normal compressibility. Venous Reflux:  None. Other Findings: There are reniform shaped nodules in subcutaneous plane in right inguinal region measuring 12 mm in short axis. There is fatty hilum. LEFT LOWER EXTREMITY Common Femoral Vein: No evidence of thrombus. Normal compressibility, respiratory phasicity and response to augmentation. Saphenofemoral Junction: No evidence of thrombus. Normal compressibility and flow on color Doppler imaging. Profunda Femoral Vein: No evidence of thrombus. Normal compressibility and flow on color Doppler imaging. Femoral Vein: No evidence of thrombus. Normal compressibility, respiratory phasicity and response to augmentation. Popliteal Vein: No evidence of thrombus. Normal compressibility, respiratory phasicity and response to augmentation. Calf Veins: No evidence of thrombus. Normal compressibility and flow on color Doppler imaging. Superficial Great Saphenous Vein: No evidence of thrombus. Normal compressibility. Venous Reflux:  None. Other Findings:  None. IMPRESSION: No evidence of deep venous thrombosis in either lower extremity. There are slightly enlarged lymph nodes in subcutaneous plane in the right inguinal region, possibly suggesting reactive hyperplasia. Continued clinical observation and short-term follow-up sonogram in 1-2 months may be considered. Electronically Signed   By: Ernie Avena M.D.   On: 06/25/2022 18:05   DG Chest Port 1 View  Result Date:  06/25/2022 CLINICAL DATA:  Acute respiratory failure EXAM: PORTABLE CHEST 1 VIEW COMPARISON:  06/25/2022 FINDINGS: Small right pleural effusion with basilar atelectasis or consolidation similar to prior study. A right chest tube is in place without change in position. Left lung is clear. Shallow inspiration. Heart size and pulmonary vascularity are normal. Mediastinal contours appear intact. No pneumothorax. Calcification of the aorta. IMPRESSION: Stable appearance of the chest since prior study. Small right pleural effusion with basilar infiltration or atelectasis. Right chest tube in place. Electronically Signed   By: Burman Nieves M.D.   On: 06/25/2022 15:59   MR BRAIN WO CONTRAST  Result Date: 06/25/2022 CLINICAL DATA:  History of non-Hodgkin's lymphoma presents with altered mental status. EXAM: MRI HEAD WITHOUT CONTRAST TECHNIQUE: Multiplanar, multiecho pulse sequences of the brain and surrounding structures were obtained without intravenous contrast. COMPARISON:  CT head 1 day prior FINDINGS: Brain: There is no acute intracranial hemorrhage, extra-axial fluid collection, or acute infarct. Parenchymal volume is normal for age. The ventricles are normal in size. Parenchymal signal is normal, with no significant burden of chronic small-vessel ischemic change. The pituitary and suprasellar region are normal. There is no mass lesion. There is no mass effect or midline shift. Vascular: Normal flow voids. Skull and upper cervical spine: Normal marrow signal. Sinuses/Orbits: There is mild mucosal thickening in the paranasal sinuses. Bilateral lens implants are in place. The globes and orbits are otherwise unremarkable. Other: None. IMPRESSION: Unremarkable for age brain MRI with no acute intracranial pathology. Electronically Signed   By: Lesia Hausen M.D.   On: 06/25/2022 14:34   CT [facility] PLEURAL DRAIN W/INDWELL CATH W/IMG GUIDE  Result Date: 06/25/2022 INDICATION: 77 year old female admitted with pneumonia  referred for pleural drainage catheter EXAM: CT-GUIDED  RIGHT CHEST TUBE TECHNIQUE: Multidetector CT imaging of the chest was performed following the standard protocol without IV contrast. RADIATION DOSE REDUCTION: This exam was performed according to the departmental dose-optimization program which includes automated exposure control, adjustment of the mA and/or kV according to patient size and/or use of iterative reconstruction technique. MEDICATIONS: The patient is currently admitted to the hospital and receiving intravenous antibiotics. The antibiotics were administered within an appropriate time frame prior to the initiation of the procedure. ANESTHESIA/SEDATION: Moderate (conscious) sedation was not employed during this procedure. Total intra-service moderate Sedation Time: 0 minutes. The patient's level of consciousness and vital signs were monitored continuously by radiology nursing throughout the procedure under my direct supervision. COMPLICATIONS: None PROCEDURE: Informed written consent was obtained from the patient's family after a thorough discussion of the procedural risks, benefits and alternatives. All questions were addressed. Maximal Sterile Barrier Technique was utilized including caps, mask, sterile gowns, sterile gloves, sterile drape, hand hygiene and skin antiseptic. A timeout was performed prior to the initiation of the procedure. Patient was positioned supine on the CT gantry table. Scout CT acquired for planning purposes. After review of the images, we elected to place the chest tube as far inferiorly as possible, to avoid a approach across the patient's right breast. Loculated fluid at the right lung base persists, similar to the comparison CT. The patient is prepped and draped in the usual sterile fashion. 1% lidocaine was used for local anesthesia. Using CT guidance, Yueh needle was advanced into the pleural space in the mid axillary line, just inferior to the nipple line. Once the Yueh  needle aspirated thin fluid a wire was advanced. Modified Seldinger technique was used to place a 10 [place] drain. Final CT images were acquired after attaching the drain to pleura vac container and confirming aspiration of fluid. Sample was sent for culture. Patient tolerated the procedure well and remained hemodynamically stable throughout. No complications were encountered and no significant blood loss. IMPRESSION: Status post CT-guided placement of right-sided chest tube. Signed, Yvone Neu. Miachel Roux, RPVI [facility] Radiology Electronically Signed   By: Gilmer Mor D.O.   On: 06/25/2022 12:53   DG Chest Port 1 View  Result Date: 06/25/2022 CLINICAL DATA:  Chest tube. EXAM: PORTABLE CHEST 1 VIEW COMPARISON:  06/28/2022 FINDINGS: Right chest tube remains in place. Right base collapse/consolidation with effusion is similar to prior given differential positioning. Left lung clear. Interstitial markings are diffusely coarsened with chronic features. The cardio pericardial silhouette is enlarged. Telemetry leads overlie the chest. IMPRESSION: Right chest tube remains in place with persistent right base collapse/consolidation and effusion. Electronically Signed   By: Kennith Center M.D.   On: 06/25/2022 07:34   US Abdomen Limited RUQ (LIVER/GB)  Result Date: 06/21/2022 CLINICAL DATA:  Elevated LFTs EXAM: ULTRASOUND ABDOMEN LIMITED RIGHT UPPER QUADRANT COMPARISON:  06/15/2022 FINDINGS: Gallbladder: No gallstones or wall thickening visualized. No sonographic Murphy sign noted by sonographer. Common bile duct: Diameter: 2 mm Liver: No focal lesion identified. Within normal limits in parenchymal echogenicity. Portal vein is patent on color Doppler imaging with normal direction of blood flow towards the liver. Other: Trace pericholecystic free fluid. Incidental small right pleural effusion similar to prior CT. IMPRESSION: 1. No evidence of cholelithiasis or  cholecystitis. 2. Trace free fluid right upper quadrant. 3. Small right pleural effusion. Electronically Signed   By: Sharlet Salina M.D.   On: 06/11/2022 20:22   DG Chest [facility] 1 View  Result  Date: 06/11/2022 CLINICAL DATA:  Chest tube placement EXAM: PORTABLE CHEST 1 VIEW COMPARISON:  06/01/2022 FINDINGS: Interval placement of a right pleural pigtail drainage catheter projecting at the right lung base. There is some mild focal narrowing of the pigtail catheter along the right chest wall, without overt kinking. Surrounding residual pleural fluid and adjacent airspace opacity noted. No significant pneumothorax. Atherosclerotic calcification of the aortic arch. The left lung appears clear. The patient is rotated to the right on today's radiograph, reducing diagnostic sensitivity and specificity. IMPRESSION: 1. Interval placement of a right pleural pigtail drainage catheter, without pneumothorax. Mild focal narrowing of the pigtail catheter along the right extrathoracic chest wall, without overt kinking. 2. Atherosclerotic calcification of the aortic arch. Electronically Signed   By: Gaylyn Rong M.D.   On: 06/23/2022 19:50   CT CHEST ABDOMEN PELVIS WO CONTRAST  Result Date: 06/21/2022 CLINICAL DATA:  Fall 1 day ago, bruising to right ribs, fever and tachycardia history of marginal zone lymphoma * Tracking Code: BO * EXAM: CT CHEST, ABDOMEN AND PELVIS WITHOUT CONTRAST TECHNIQUE: Multidetector CT imaging of the chest, abdomen and pelvis was performed following the standard protocol without IV contrast. RADIATION DOSE REDUCTION: This exam was performed according to the departmental dose-optimization program which includes automated exposure control, adjustment of the mA and/or kV according to patient size and/or use of iterative reconstruction technique. COMPARISON:  CT chest abdomen pelvis, 12/11/2021 FINDINGS: Examination is generally limited by breath motion artifact and lack of intravenous contrast. CT  CHEST FINDINGS Cardiovascular: Aortic atherosclerosis. Normal heart size. No pericardial effusion. Mediastinum/Nodes: No enlarged mediastinal, hilar, or axillary lymph nodes. Thyroid gland, trachea, and esophagus demonstrate no significant findings. Lungs/Pleura: Moderate, loculated right pleural effusion with extensive associated atelectasis or consolidation. Bandlike scarring of the left lung base. Musculoskeletal: No chest wall abnormality. No acute osseous findings. CT ABDOMEN PELVIS FINDINGS Hepatobiliary: No solid liver abnormality is seen. Hepatomegaly, maximum coronal span 20.5 cm. No gallstones, gallbladder wall thickening, or biliary dilatation. Pancreas: Unremarkable. No pancreatic ductal dilatation or surrounding inflammatory changes. Spleen: Splenomegaly, maximum coronal span 14.2 cm. Adrenals/Urinary Tract: Adrenal glands are unremarkable. Kidneys are otherwise normal, without renal calculi, solid lesion, or hydronephrosis. Bladder is unremarkable. Stomach/Bowel: Stomach is within normal limits. Appendix appears normal. No evidence of bowel wall thickening, distention, or inflammatory changes. Vascular/Lymphatic: Aortic atherosclerosis. No significant change in retroperitoneal, iliac, inguinal, and mesenteric lymphadenopathy. Reproductive: Status post hysterectomy. Other: No abdominal hernia. Large contusion and hematoma of the left buttock (series 2, image 96). No ascites. Musculoskeletal: No acute osseous findings. IMPRESSION: 1. Examination is generally limited by breath motion artifact and lack of intravenous contrast. 2. Moderate, loculated right pleural effusion with extensive associated atelectasis or consolidation. No overlying rib fracture. 3. Large contusion and hematoma of the left buttock. 4. No significant change in retroperitoneal, iliac, inguinal, and mesenteric lymphadenopathy, as well as splenomegaly, in keeping with history of lymphoma. 5. Hepatomegaly. Aortic Atherosclerosis  (ICD10-I70.0). Electronically Signed   By: Jearld Lesch M.D.   On: 06/02/2022 11:53   CT Head Wo Contrast  Result Date: 06/23/2022 CLINICAL DATA:  Fall 1 day ago, facial trauma EXAM: CT HEAD WITHOUT CONTRAST CT CERVICAL SPINE WITHOUT CONTRAST TECHNIQUE: Multidetector CT imaging of the head and cervical spine was performed following the standard protocol without intravenous contrast. Multiplanar CT image reconstructions of the cervical spine were also generated. RADIATION DOSE REDUCTION: This exam was performed according to the departmental dose-optimization program which includes automated exposure control, adjustment of the mA and/or kV  according to patient size and/or use of iterative reconstruction technique. COMPARISON:  None Available. FINDINGS: CT HEAD FINDINGS Brain: No evidence of acute infarction, hemorrhage, hydrocephalus, extra-axial collection or mass lesion/mass effect. Periventricular and deep white matter hypodensity. Vascular: No hyperdense vessel or unexpected calcification. Skull: Normal. Negative for fracture or focal lesion. Sinuses/Orbits: No acute finding. Other: None. CT CERVICAL SPINE FINDINGS Alignment: Degenerative straightening and reversal of the normal cervical lordosis. Skull base and vertebrae: No acute fracture. No primary bone lesion or focal pathologic process. Soft tissues and spinal canal: No prevertebral fluid or swelling. No visible canal hematoma. Disc levels: Severe disc space height loss and osteophytosis from C5 through C7 with otherwise relatively preserved disc spaces. Upper chest: Partially imaged right-sided pleural mass or fluid; please see forthcoming dedicated examination of the chest abdomen and pelvis. Other: None. IMPRESSION: 1. No acute intracranial pathology. Small-vessel white matter disease. 2. No fracture or static subluxation of the cervical spine. 3. Severe disc space height loss and osteophytosis from C5 through C7 with otherwise relatively preserved  disc spaces. 4. Partially imaged right-sided pleural mass or fluid; please see forthcoming dedicated examination of the chest abdomen and pelvis. Electronically Signed   By: Jearld Lesch M.D.   On: 06/04/2022 11:37   CT Cervical Spine Wo Contrast  Result Date: 06/10/2022 CLINICAL DATA:  Fall 1 day ago, facial trauma EXAM: CT HEAD WITHOUT CONTRAST CT CERVICAL SPINE WITHOUT CONTRAST TECHNIQUE: Multidetector CT imaging of the head and cervical spine was performed following the standard protocol without intravenous contrast. Multiplanar CT image reconstructions of the cervical spine were also generated. RADIATION DOSE REDUCTION: This exam was performed according to the departmental dose-optimization program which includes automated exposure control, adjustment of the mA and/or kV according to patient size and/or use of iterative reconstruction technique. COMPARISON:  None Available. FINDINGS: CT HEAD FINDINGS Brain: No evidence of acute infarction, hemorrhage, hydrocephalus, extra-axial collection or mass lesion/mass effect. Periventricular and deep white matter hypodensity. Vascular: No hyperdense vessel or unexpected calcification. Skull: Normal. Negative for fracture or focal lesion. Sinuses/Orbits: No acute finding. Other: None. CT CERVICAL SPINE FINDINGS Alignment: Degenerative straightening and reversal of the normal cervical lordosis. Skull base and vertebrae: No acute fracture. No primary bone lesion or focal pathologic process. Soft tissues and spinal canal: No prevertebral fluid or swelling. No visible canal hematoma. Disc levels: Severe disc space height loss and osteophytosis from C5 through C7 with otherwise relatively preserved disc spaces. Upper chest: Partially imaged right-sided pleural mass or fluid; please see forthcoming dedicated examination of the chest abdomen and pelvis. Other: None. IMPRESSION: 1. No acute intracranial pathology. Small-vessel white matter disease. 2. No fracture or static  subluxation of the cervical spine. 3. Severe disc space height loss and osteophytosis from C5 through C7 with otherwise relatively preserved disc spaces. 4. Partially imaged right-sided pleural mass or fluid; please see forthcoming dedicated examination of the chest abdomen and pelvis. Electronically Signed   By: Jearld Lesch M.D.   On: 06/20/2022 11:37   DG Chest Portable 1 View  Result Date: 06/28/2022 CLINICAL DATA:  Sepsis. EXAM: PORTABLE CHEST 1 VIEW COMPARISON:  X-ray 05/26/2021 FINDINGS: Small right effusion with the adjacent opacities. No pneumothorax or edema. Stable cardiopericardial silhouette. Tortuous and ectatic aorta. IMPRESSION: Small right effusion with adjacent lung opacity. Acute infiltrates possible. Recommend follow-up to confirm clearance Electronically Signed   By: Karen Kays M.D.   On: 06/12/2022 10:32    Microbiology Recent Results (from the past 240 hour(s))  Culture,  blood (Routine x 2)     Status: Abnormal   Collection Time: 06/14/2022 10:09 AM   Specimen: BLOOD LEFT FOREARM  Result Value Ref Range Status   Specimen Description   Final    BLOOD LEFT FOREARM Performed at [facility], [address] [place], [place] [address]    Special Requests   Final    BOTTLES DRAWN AEROBIC ONLY Blood Culture results may not be optimal due to an inadequate volume of blood received in culture bottles Performed at [facility], [address], [place], [place] [address]    Culture  Setup Time   Final    GRAM POSITIVE RODS AEROBIC BOTTLE ONLY CRITICAL VALUE NOTED.  VALUE IS CONSISTENT WITH PREVIOUSLY REPORTED AND CALLED VALUE. SKL CORRECTED RESULTS GRAM POSITIVE COCCI IN CHAINS PREVIOUSLY REPORTED AS: GRAM POSITIVE RODS CORRECTED RESULTS CALLED TO: J ROBBINS,PHARMD@0300  06/25/22 MK    Culture (A)  Final    STREPTOCOCCUS PNEUMONIAE SUSCEPTIBILITIES PERFORMED ON PREVIOUS CULTURE WITHIN THE LAST 5 DAYS. Performed at [facility], [address] [place], [place] [address]    Report Status 06/27/2022 FINAL  Final  Culture, blood (Routine x 2)     Status: Abnormal   Collection Time: 06/06/2022 10:09 AM   Specimen: Right Antecubital; Blood  Result Value Ref Range Status   Specimen Description   Final    RIGHT ANTECUBITAL Performed at [facility], [address] [place], [place] [address]    Special Requests   Final    BOTTLES DRAWN AEROBIC AND ANAEROBIC Blood Culture results may not be optimal due to an excessive volume of blood received in culture bottles Performed at [facility], [address], [place], [place] [address]    Culture  Setup Time   Final    GRAM POSITIVE RODS IN BOTH AEROBIC AND ANAEROBIC BOTTLES CRITICAL RESULT CALLED TO, READ BACK BY AND VERIFIED WITH: Cheron Every @2048  on 06/07/2022 skl CORRECTED RESULTS GRAM POSITIVE COCCI IN CHAINS PREVIOUSLY REPORTED AS: GRAM POSITIVE RODS CORRECTED RESULTS CALLED TO: J ROBBINS,PHARMD@0300  06/25/22 MK Performed at [facility], [address], [place], [place] [address]    Culture STREPTOCOCCUS PNEUMONIAE (A)  Final   Report Status 06/27/2022 FINAL  Final   Organism ID, Bacteria STREPTOCOCCUS PNEUMONIAE  Final      Susceptibility   Streptococcus pneumoniae - MIC*    ERYTHROMYCIN <=0.12 SENSITIVE Sensitive     LEVOFLOXACIN 0.5 SENSITIVE Sensitive     VANCOMYCIN 0.5 SENSITIVE Sensitive     PENICILLIN (meningitis) <=0.06 SENSITIVE Sensitive     PENO - penicillin <=0.06      PENICILLIN (non-meningitis) <=0.06 SENSITIVE Sensitive     PENICILLIN (oral) <=0.06 SENSITIVE Sensitive     CEFTRIAXONE (non-meningitis) <=0.12 SENSITIVE Sensitive     CEFTRIAXONE (meningitis) <=0.12 SENSITIVE Sensitive     * STREPTOCOCCUS PNEUMONIAE  Respiratory (~20 pathogens) panel by PCR     Status: None   Collection Time: 06/05/2022 10:09 AM   Specimen: Nasopharyngeal Swab; Respiratory  Result Value Ref Range Status   Adenovirus NOT DETECTED NOT DETECTED Final    Coronavirus 229E NOT DETECTED NOT DETECTED Final    Comment: (NOTE) The Coronavirus on the Respiratory Panel, DOES NOT test for the novel  Coronavirus (2019 nCoV)    Coronavirus HKU1 NOT DETECTED NOT DETECTED Final   Coronavirus NL63 NOT DETECTED NOT DETECTED Final   Coronavirus OC43 NOT DETECTED NOT DETECTED Final   Metapneumovirus NOT DETECTED NOT DETECTED Final  Rhinovirus / Enterovirus NOT DETECTED NOT DETECTED Final   Influenza A NOT DETECTED NOT DETECTED Final   Influenza B NOT DETECTED NOT DETECTED Final   Parainfluenza Virus 1 NOT DETECTED NOT DETECTED Final   Parainfluenza Virus 2 NOT DETECTED NOT DETECTED Final   Parainfluenza Virus 3 NOT DETECTED NOT DETECTED Final   Parainfluenza Virus 4 NOT DETECTED NOT DETECTED Final   Respiratory Syncytial Virus NOT DETECTED NOT DETECTED Final   Bordetella pertussis NOT DETECTED NOT DETECTED Final   Bordetella Parapertussis NOT DETECTED NOT DETECTED Final   Chlamydophila pneumoniae NOT DETECTED NOT DETECTED Final   Mycoplasma pneumoniae NOT DETECTED NOT DETECTED Final    Comment: Performed at [facility], [address], [place], [place] [address]  Blood Culture ID Panel (Reflexed)     Status: Abnormal   Collection Time: 06/14/2022 10:09 AM  Result Value Ref Range Status   Enterococcus faecalis NOT DETECTED NOT DETECTED Final   Enterococcus Faecium NOT DETECTED NOT DETECTED Final   Listeria monocytogenes NOT DETECTED NOT DETECTED Final   Staphylococcus species NOT DETECTED NOT DETECTED Final   Staphylococcus aureus (BCID) NOT DETECTED NOT DETECTED Final   Staphylococcus epidermidis NOT DETECTED NOT DETECTED Final   Staphylococcus lugdunensis NOT DETECTED NOT DETECTED Final   Streptococcus species DETECTED (A) NOT DETECTED Final    Comment: CRITICAL RESULT CALLED TO, READ BACK BY AND VERIFIED WITH: J ROBBINS,PHARMD@0300  06/25/22 MK CORRECTED RESULTS GRAM POSITIVE COCCI IN CHAINS PREVIOUSLY REPORTED AS: GRAM POSITIVE  RODS CORRECTED RESULTS CALLED TO: J ROBBINS,PHARMD@0300  06/25/22 MK    Streptococcus agalactiae NOT DETECTED NOT DETECTED Final   Streptococcus pneumoniae DETECTED (A) NOT DETECTED Final    Comment: CRITICAL RESULT CALLED TO, READ BACK BY AND VERIFIED WITH: J ROBBINS,PHARMD@0300  06/25/22 MK CORRECTED RESULTS GRAM POSITIVE COCCI IN CHAINS PREVIOUSLY REPORTED AS: GRAM POSITIVE RODS CORRECTED RESULTS CALLED TO: J ROBBINS,PHARMD@0300  06/25/22 MK    Streptococcus pyogenes NOT DETECTED NOT DETECTED Final   A.calcoaceticus-baumannii NOT DETECTED NOT DETECTED Final   Bacteroides fragilis NOT DETECTED NOT DETECTED Final   Enterobacterales NOT DETECTED NOT DETECTED Final   Enterobacter cloacae complex NOT DETECTED NOT DETECTED Final   Escherichia coli NOT DETECTED NOT DETECTED Final   Klebsiella aerogenes NOT DETECTED NOT DETECTED Final   Klebsiella oxytoca NOT DETECTED NOT DETECTED Final   Klebsiella pneumoniae NOT DETECTED NOT DETECTED Final   Proteus species NOT DETECTED NOT DETECTED Final   Salmonella species NOT DETECTED NOT DETECTED Final   Serratia marcescens NOT DETECTED NOT DETECTED Final   Haemophilus influenzae NOT DETECTED NOT DETECTED Final   Neisseria meningitidis NOT DETECTED NOT DETECTED Final   Pseudomonas aeruginosa NOT DETECTED NOT DETECTED Final   Stenotrophomonas maltophilia NOT DETECTED NOT DETECTED Final   Candida albicans NOT DETECTED NOT DETECTED Final   Candida auris NOT DETECTED NOT DETECTED Final   Candida glabrata NOT DETECTED NOT DETECTED Final   Candida krusei NOT DETECTED NOT DETECTED Final   Candida parapsilosis NOT DETECTED NOT DETECTED Final   Candida tropicalis NOT DETECTED NOT DETECTED Final   Cryptococcus neoformans/gattii NOT DETECTED NOT DETECTED Final    Comment: Performed at [facility], [address], [place], [place] [address]  Resp Panel by RT-PCR (Flu A&B, Covid) Anterior Nasal Swab     Status: None   Collection Time: 06/20/2022 10:15  AM   Specimen: Anterior Nasal Swab  Result Value Ref Range Status   SARS Coronavirus 2 by RT PCR NEGATIVE NEGATIVE Final  Comment: (NOTE) SARS-CoV-2 target nucleic acids are NOT DETECTED.  The SARS-CoV-2 RNA is generally detectable in upper respiratory specimens during the acute phase of infection. The lowest concentration of SARS-CoV-2 viral copies this assay can detect is 138 copies/mL. A negative result does not preclude SARS-Cov-2 infection and should not be used as the sole basis for treatment or other patient management decisions. A negative result may occur with  improper specimen collection/handling, submission of specimen other than nasopharyngeal swab, presence of viral mutation(s) within the areas targeted by this assay, and inadequate number of viral copies(<138 copies/mL). A negative result must be combined with clinical observations, patient history, and epidemiological information. The expected result is Negative.  Fact Sheet for Patients:  BloggerCourse.com  Fact Sheet for Healthcare Providers:  SeriousBroker.it  This test is no t yet approved or cleared by the [place] FDA and  has been authorized for detection and/or diagnosis of SARS-CoV-2 by FDA under an Emergency Use Authorization (EUA). This EUA will remain  in effect (meaning this test can be used) for the duration of the COVID-19 declaration under Section 564(b)(1) of the Act, 21 U.S.C.section 360bbb-3(b)(1), unless the authorization is terminated  or revoked sooner.       Influenza A by PCR NEGATIVE NEGATIVE Final   Influenza B by PCR NEGATIVE NEGATIVE Final    Comment: (NOTE) The Xpert Xpress SARS-CoV-2/FLU/RSV plus assay is intended as an aid in the diagnosis of influenza from Nasopharyngeal swab specimens and should not be used as a sole basis for treatment. Nasal washings and aspirates are unacceptable for Xpert Xpress  SARS-CoV-2/FLU/RSV testing.  Fact Sheet for Patients: BloggerCourse.com  Fact Sheet for Healthcare Providers: SeriousBroker.it  This test is not yet approved or cleared by the [place] FDA and has been authorized for detection and/or diagnosis of SARS-CoV-2 by FDA under an Emergency Use Authorization (EUA). This EUA will remain in effect (meaning this test can be used) for the duration of the COVID-19 declaration under Section 564(b)(1) of the Act, 21 U.S.C. section 360bbb-3(b)(1), unless the authorization is terminated or revoked.  Performed at [facility], [address] [place], [place] [address]   MRSA Next Gen by PCR, Nasal     Status: None   Collection Time: 06/08/2022  2:35 PM   Specimen: Nasal Mucosa; Nasal Swab  Result Value Ref Range Status   MRSA by PCR Next Gen NOT DETECTED NOT DETECTED Final    Comment: (NOTE) The GeneXpert MRSA Assay (FDA approved for NASAL specimens only), is one component of a comprehensive MRSA colonization surveillance program. It is not intended to diagnose MRSA infection nor to guide or monitor treatment for MRSA infections. Test performance is not FDA approved in patients less than 24 years old. Performed at [facility], [address] [place], [place] [address]   Eye culture w Gram Stain     Status: None   Collection Time: 06/25/22  3:39 PM   Specimen: Wound; Eye  Result Value Ref Range Status   Specimen Description   Final    WOUND Performed at [facility], [address] [place], [place] [address]    Special Requests   Final    RIGHT EYE Performed at [facility], [address] [place], [place] [address]    Gram Stain NO WBC SEEN NO ORGANISMS SEEN   Final   Culture   Final    NO GROWTH 3 DAYS Performed at [facility], [address]  [place], [place] [address]    Report Status 06/28/2022 FINAL  Final  Culture, blood  (Routine X 2) w Reflex to ID Panel     Status: None (Preliminary result)   Collection Time: 06/26/22  3:58 AM   Specimen: BLOOD  Result Value Ref Range Status   Specimen Description BLOOD LEFT ANTECUBITAL  Final   Special Requests   Final    BOTTLES DRAWN AEROBIC ONLY Blood Culture results may not be optimal due to an inadequate volume of blood received in culture bottles   Culture   Final    NO GROWTH 2 DAYS Performed at [facility], [address] [place], [place] [address]    Report Status PENDING  Incomplete  Culture, blood (Routine X 2) w Reflex to ID Panel     Status: None (Preliminary result)   Collection Time: 06/26/22  4:00 AM   Specimen: BLOOD  Result Value Ref Range Status   Specimen Description BLOOD BLOOD LEFT HAND  Final   Special Requests   Final    BOTTLES DRAWN AEROBIC ONLY Blood Culture results may not be optimal due to an inadequate volume of blood received in culture bottles   Culture   Final    NO GROWTH 2 DAYS Performed at [facility], [address] [place], [place] [address]    Report Status PENDING  Incomplete  CSF culture w Gram Stain     Status: None (Preliminary result)   Collection Time: 06/26/22 12:59 PM   Specimen: PATH Cytology CSF; Cerebrospinal Fluid  Result Value Ref Range Status   Specimen Description   Final    CSF Performed at [facility], [address], [place], [place] [address]    Special Requests   Final    NONE Performed at [facility], [address] [place], [place] [address]    Gram Stain   Final    CYTOSPIN SMEAR RED BLOOD CELLS PRESENT WBC PRESENT, PREDOMINANTLY PMN GRAM POSITIVE COCCI IN PAIRS CORRECTED RESULTS PREVIOUSLY REPORTED AS: NO ORGANISMS SEEN CRITICAL RESULT CALLED TO, READ BACK BY AND VERIFIED WITH:  Oralia Manis, RN 06/26/22 2010 A. LAFRANCE    Culture   Final    NO GROWTH 2 DAYS Performed at [facility], [address] [place], [place] [address]     Report Status PENDING  Incomplete  Culture, Fungus without Smear     Status: None (Preliminary result)   Collection Time: 06/26/22 12:59 PM   Specimen: PATH Cytology CSF; Cerebrospinal Fluid  Result Value Ref Range Status   Specimen Description   Final    CSF Performed at [facility], [address], [place], [place] [address]    Special Requests   Final    NONE Performed at [facility], [address] [place], [place] [address]    Culture   Final    NO FUNGUS ISOLATED AFTER 2 DAYS Performed at [facility], [address], [place], [place] [address]    Report Status PENDING  Incomplete    Lab Basic Metabolic Panel: Recent Labs  Lab 06/13/2022 2120 06/25/22 0508 06/25/22 1605 06/26/22 0358 06/26/22 0400 06/27/22 0356  NA 133* 135 140  --  140 142  K 4.1 3.7 3.8  --  4.3 4.4  CL 99 98 102  --  100 103  CO2 23 25 20*  --  26 26  GLUCOSE 163* 122* 117*  --  147* 174*  BUN  54* 51* 50*  --  59* 72*  CREATININE 1.99* 1.97* 1.63*  --  1.50* 1.33*  CALCIUM 8.2* 8.1* 9.3  --  8.3* 8.1*  MG  --   --   --  2.6*  --   --   PHOS  --  6.1*  --   --  4.6 3.8   Liver Function Tests: Recent Labs  Lab 06/28/2022 1009 06/05/2022 1722 06/25/22 0508 06/25/22 0510 06/26/22 0358 06/26/22 0400 06/27/22 0356  AST 40  --   --  26 21  --   --   ALT 28  --   --  20 19  --   --   ALKPHOS 197*  --   --  142* 139*  --   --   BILITOT 3.9*  --   --  3.8* 2.9*  --   --   PROT 7.2 5.9*  --  6.2* 6.6  --   --   ALBUMIN 2.2*  --  1.8* 1.9* 1.9* 1.9* 1.8*   No results for input(s): "LIPASE", "AMYLASE" in the last 168 hours. Recent Labs  Lab 06/16/2022 1009  AMMONIA 11   CBC: Recent Labs  Lab 06/28/2022 1009 06/25/22 0510 06/26/22 0358 06/27/22 0356  WBC 25.3* 20.1* 24.8* 19.9*  NEUTROABS 21.4*  --   --   --   HGB 11.9* 9.5* 10.1* 10.4*  HCT 34.4* 27.7* 29.8* 31.2*  MCV 90.1 90.2 89.8 90.4  PLT 232 175 237 249   Cardiac Enzymes: Recent Labs  Lab  06/03/2022 1009  CKTOTAL 111   Sepsis Labs: Recent Labs  Lab 06/15/2022 1009 06/03/2022 1245 06/03/2022 1440 06/06/2022 1810 06/25/22 0510 06/25/22 1526 06/26/22 0358 06/26/22 0808 06/27/22 0356  PROCALCITON 4.04  --   --   --  21.81  --  14.75 14.73  --   WBC 25.3*  --   --   --  20.1*  --  24.8*  --  19.9*  LATICACIDVEN 4.5* 2.0* 1.4 2.2*  --  2.8*  --   --   --     Procedures/Operations  06/10/2022: CT placement 06/26/22: EEG performed 06/26/22: LP performed    Cheryll Cockayne Rust-Chester, AGACNP-BC Acute Care Nurse Practitioner [facility]   [phone] / [phone] Please see Amion for pager details.

## 2022-06-30 DEATH — deceased

## 2022-07-01 LAB — CULTURE, BLOOD (ROUTINE X 2): Culture: NO GROWTH

## 2022-07-04 LAB — CULTURE, FUNGUS WITHOUT SMEAR

## 2022-07-08 LAB — CULTURE, FUNGUS WITHOUT SMEAR

## 2022-07-09 LAB — CULTURE, FUNGUS WITHOUT SMEAR

## 2022-07-14 LAB — CULTURE, FUNGUS WITHOUT SMEAR

## 2022-08-06 ENCOUNTER — Ambulatory Visit: Payer: Medicare PPO | Admitting: Hematology

## 2022-08-06 ENCOUNTER — Other Ambulatory Visit: Payer: Medicare PPO

## 2022-08-25 ENCOUNTER — Other Ambulatory Visit: Payer: Medicare PPO

## 2022-09-23 ENCOUNTER — Ambulatory Visit: Payer: Medicare Other | Admitting: Nurse Practitioner

## 2023-03-22 ENCOUNTER — Encounter: Payer: Medicare PPO | Admitting: Nurse Practitioner
